# Patient Record
Sex: Male | Born: 1945 | Race: White | Hispanic: No | Marital: Married | Smoking: Never smoker
Health system: Southern US, Community
[De-identification: ages and names within clinical notes are randomized; demographics above are authoritative.]

## PROBLEM LIST (undated history)

## (undated) DIAGNOSIS — E785 Hyperlipidemia, unspecified: Secondary | ICD-10-CM

## (undated) DIAGNOSIS — G43909 Migraine, unspecified, not intractable, without status migrainosus: Secondary | ICD-10-CM

## (undated) DIAGNOSIS — I639 Cerebral infarction, unspecified: Secondary | ICD-10-CM

## (undated) DIAGNOSIS — I10 Essential (primary) hypertension: Secondary | ICD-10-CM

## (undated) DIAGNOSIS — I6529 Occlusion and stenosis of unspecified carotid artery: Secondary | ICD-10-CM

## (undated) HISTORY — PX: BACK SURGERY: SHX140

## (undated) HISTORY — DX: Occlusion and stenosis of unspecified carotid artery: I65.29

## (undated) HISTORY — PX: TESTICLE REMOVAL: SHX68

## (undated) HISTORY — DX: Essential (primary) hypertension: I10

## (undated) HISTORY — DX: Hyperlipidemia, unspecified: E78.5

## (undated) HISTORY — PX: PATELLA FRACTURE SURGERY: SHX735

## (undated) HISTORY — PX: EYE SURGERY: SHX253

## (undated) HISTORY — DX: Migraine, unspecified, not intractable, without status migrainosus: G43.909

## (undated) HISTORY — PX: LUMBAR DISC SURGERY: SHX700

---

## 2013-10-07 ENCOUNTER — Encounter: Payer: Self-pay | Admitting: Podiatry

## 2013-10-07 ENCOUNTER — Ambulatory Visit (INDEPENDENT_AMBULATORY_CARE_PROVIDER_SITE_OTHER): Payer: Medicare Other | Admitting: Podiatry

## 2013-10-07 VITALS — BP 123/65 | HR 65 | Resp 16 | Ht 69.0 in | Wt 175.0 lb

## 2013-10-07 DIAGNOSIS — L600 Ingrowing nail: Secondary | ICD-10-CM

## 2013-10-07 MED ORDER — HYDROCODONE-ACETAMINOPHEN 10-325 MG PO TABS: 1.0000 | ORAL_TABLET | Freq: Three times a day (TID) | ORAL | Status: DC | PRN

## 2013-10-07 NOTE — Progress Notes (Signed)
  Subjective:    Patient ID: Arthur Guzman, male    DOB: Jun 08, 1946, 67 y.o.   MRN: [id number] "I have a sore left big toe.  It's the toenail, I can hardly wear a shoe." Toe Pain  The incident occurred more than 1 week ago. There was no injury mechanism. The pain is present in the left toes. The quality of the pain is described as aching. The pain is at a severity of 8/10. The pain is moderate. The pain has been worsening since onset. He reports no foreign bodies present. The symptoms are aggravated by palpation. The treatment provided no relief.      Review of Systems  Constitutional: Negative.   HENT: Negative.   Eyes: Negative.   Respiratory: Negative.   Cardiovascular: Negative.   Gastrointestinal: Negative.   Endocrine: Negative.   Genitourinary: Negative.   Musculoskeletal: Positive for neck stiffness.  Skin: Positive for rash.  Allergic/Immunologic: Negative.   Neurological: Negative.   Hematological: Negative.   Psychiatric/Behavioral: Negative.        Objective:   Physical Exam  A 67 year old white male presents today complaining of painful left hallux toenail. He appears orientated x3.  Vascular: The DP is are two over four bilaterally. PTs are 4 over 4 bilaterally. Capillary fill is immediate bilaterally.  Dermatological: The left hallux is incurvated dystrophic with texture and color changes and palpable tenderness.  Musculoskeletal: Some restriction in the first metatarsal phalangeal joint noted bilaterally.  Neurological: Sensation intact bilaterally       Assessment & Plan:  Assessment: Painful ingrowing toenail left hallux.  Plan: Offered patient alternatives in treatment including nonsurgical and surgical. I recommended total removal of the left hallux toenail for permanent correction. He verbally consents to the procedure. The left hallux was then blocked with 3 cc of 50-50 mixture of 2% plain Xylocaine and 0.5% plain Marcaine. The hallux is  prepped with Betadine and exsanguinated. The left hallux nail is excised and a phenol matricectomy performed. An antibiotic dressing was applied. The tourniquet was released and spontaneous Capillary filling time was noted in the left hallux. Postoperative instructions provided. Hydrocodone 10 mg /325 was prescribed. Reappoint at patient's request.  Richard C.Leeanne Deed, DPM

## 2013-10-07 NOTE — Patient Instructions (Signed)

## 2014-09-22 ENCOUNTER — Ambulatory Visit (INDEPENDENT_AMBULATORY_CARE_PROVIDER_SITE_OTHER): Payer: Medicare Other | Admitting: Podiatry

## 2014-09-22 ENCOUNTER — Encounter: Payer: Self-pay | Admitting: Podiatry

## 2014-09-22 VITALS — BP 160/86 | HR 58 | Resp 17

## 2014-09-22 DIAGNOSIS — L600 Ingrowing nail: Secondary | ICD-10-CM

## 2014-09-22 MED ORDER — HYDROCODONE-ACETAMINOPHEN 5-325 MG PO TABS: 1.0000 | ORAL_TABLET | ORAL | Status: DC | PRN

## 2014-09-22 MED ORDER — HYDROCODONE-ACETAMINOPHEN 10-325 MG PO TABS: 1.0000 | ORAL_TABLET | Freq: Three times a day (TID) | ORAL | Status: DC | PRN

## 2014-09-22 NOTE — Progress Notes (Signed)
   Subjective:    Patient ID: Arthur Guzman, male    DOB: 29-Jul-1946, 68 y.o.   MRN: [id number]  HPI  Pt presents with left 2nd toenail pain. Worsens when wearing shoes that rub against it. Ongoing problem for oximetry 12 months. He has difficulty trimming the toenail and is requesting removal of the second left toenail the   Review of Systems  All other systems reviewed and are negative.      Objective:   Physical Exam  Orientated x3  Vascular: DP pulses 2/4 bilaterally PT pulses 2/4 bilaterally  Neurological: Ankle reflex equal and reactive bilaterally  Dermatological: The second left toenail is hypertrophic, incurvated and tender to palpation     Assessment & Plan:   Assessment: Deformed ingrowing second toenail left foot  Plan: Discuss treatment options including repetitive debridement, versus permanent toenail removal. Patient opts for permanent toenail removal, and verbally consents to procedure  The second left toe was blocked with 2 cc of 50-50 mixture of 2% plain Xylocaine and 0.5% plain Marcaine. The toe is prepped with Betadine and exsanguinated. The second left toenail was avulsed and a phenol matricectomy performed. An antibiotic dressing was applied and the tourniquet was released demonstrating immediate capillary fill. Patient tolerated procedure without a difficulty.  Postoperative oral reconstruction provided Rx hydrocodone 5/325 #12 take one every 4 hours as needed for pain control  I incorrectly initially ordered hydrocodone 10/325 and did not realize until I printed out a prescription that to prescriptions for hydrocodone were prescribed. I crossed out the hydrocodone 10 on the prescription and will only use the hydrocodone 5/325.  Reappoint at patient's request

## 2014-09-22 NOTE — Patient Instructions (Signed)

## 2015-12-18 HISTORY — PX: CATARACT EXTRACTION: SUR2

## 2017-11-28 ENCOUNTER — Encounter: Payer: Self-pay | Admitting: Surgery

## 2019-01-30 ENCOUNTER — Encounter: Payer: Self-pay | Admitting: Gastroenterology

## 2019-02-24 ENCOUNTER — Ambulatory Visit: Payer: Self-pay | Admitting: Gastroenterology

## 2019-07-05 ENCOUNTER — Emergency Department (HOSPITAL_COMMUNITY)
Admission: EM | Admit: 2019-07-05 | Discharge: 2019-07-05 | Disposition: A | Discharge status: Home / Self Care | Payer: Medicare Other | Attending: Emergency Medicine | Admitting: Emergency Medicine

## 2019-07-05 ENCOUNTER — Other Ambulatory Visit: Payer: Self-pay

## 2019-07-05 ENCOUNTER — Encounter (HOSPITAL_COMMUNITY): Payer: Self-pay | Admitting: Radiology

## 2019-07-05 ENCOUNTER — Emergency Department (HOSPITAL_COMMUNITY): Payer: Medicare Other

## 2019-07-05 DIAGNOSIS — R51 Headache: Secondary | ICD-10-CM | POA: Diagnosis present

## 2019-07-05 DIAGNOSIS — R519 Headache, unspecified: Secondary | ICD-10-CM

## 2019-07-05 DIAGNOSIS — R112 Nausea with vomiting, unspecified: Secondary | ICD-10-CM

## 2019-07-05 LAB — URINALYSIS, ROUTINE W REFLEX MICROSCOPIC
Bilirubin Urine: NEGATIVE
Glucose, UA: NEGATIVE mg/dL
Hgb urine dipstick: NEGATIVE
Ketones, ur: NEGATIVE mg/dL
Leukocytes,Ua: NEGATIVE
Nitrite: NEGATIVE
Protein, ur: NEGATIVE mg/dL
Specific Gravity, Urine: 1.013 (ref 1.005–1.030)
pH: 8 (ref 5.0–8.0)

## 2019-07-05 LAB — COMPREHENSIVE METABOLIC PANEL
ALT: 23 U/L (ref 0–44)
AST: 26 U/L (ref 15–41)
Albumin: 3.9 g/dL (ref 3.5–5.0)
Alkaline Phosphatase: 79 U/L (ref 38–126)
Anion gap: 9 (ref 5–15)
BUN: 15 mg/dL (ref 8–23)
CO2: 26 mmol/L (ref 22–32)
Calcium: 9.3 mg/dL (ref 8.9–10.3)
Chloride: 102 mmol/L (ref 98–111)
Creatinine, Ser: 0.98 mg/dL (ref 0.61–1.24)
GFR calc Af Amer: >60 mL/min (ref >60)
GFR calc non Af Amer: >60 mL/min (ref >60)
Glucose, Bld: 135 mg/dL — ABNORMAL HIGH (ref 70–99)
Potassium: 3.3 mmol/L — ABNORMAL LOW (ref 3.5–5.1)
Sodium: 137 mmol/L (ref 135–145)
Total Bilirubin: 0.7 mg/dL (ref 0.3–1.2)
Total Protein: 7.5 g/dL (ref 6.5–8.1)

## 2019-07-05 LAB — CBC
HCT: 41 % (ref 39.0–52.0)
Hemoglobin: 14.1 g/dL (ref 13.0–17.0)
MCH: 31.3 pg (ref 26.0–34.0)
MCHC: 34.4 g/dL (ref 30.0–36.0)
MCV: 90.9 fL (ref 80.0–100.0)
Platelets: 212 10*3/uL (ref 150–400)
RBC: 4.51 MIL/uL (ref 4.22–5.81)
RDW: 12.9 % (ref 11.5–15.5)
WBC: 12.3 10*3/uL — ABNORMAL HIGH (ref 4.0–10.5)
nRBC: 0 % (ref 0.0–0.2)

## 2019-07-05 LAB — TROPONIN I (HIGH SENSITIVITY)
Troponin I (High Sensitivity): 5 ng/L (ref <18)
Troponin I (High Sensitivity): 5 ng/L (ref <18)

## 2019-07-05 LAB — LIPASE, BLOOD: Lipase: 30 U/L (ref 11–51)

## 2019-07-05 IMAGING — CT CT ANGIOGRAPHY HEAD
1 of 10 series · 6 of 47 positions shown · IV contrast (omnipaque)
Comparison: None.

CLINICAL DATA: Awoke with headache, nausea and vomiting.

EXAM:
CT ANGIOGRAPHY HEAD
TECHNIQUE: Multidetector CT imaging of the head was performed using the
standard protocol during bolus administration of intravenous
contrast. Multiplanar CT image reconstructions and MIPs were
obtained to evaluate the vascular anatomy.
CONTRAST:  50mL OMNIPAQUE IOHEXOL 350 MG/ML SOLN

[Series 9: cow 2.0 · axial · 0.41mm/px · z∈[+1284,+1394]mm · 6 of 79 slices shown]
[im 12/79  brain]
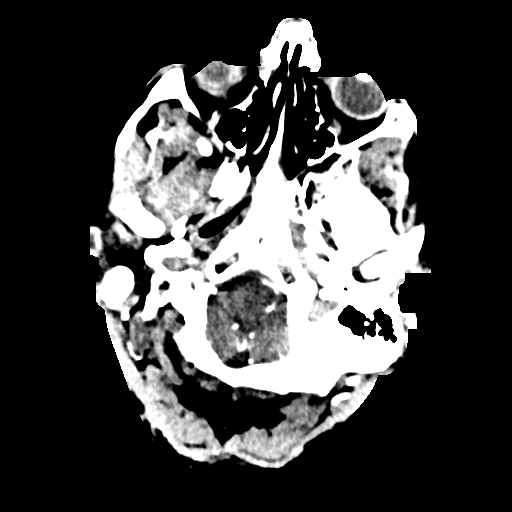
[im 23/79  bone]
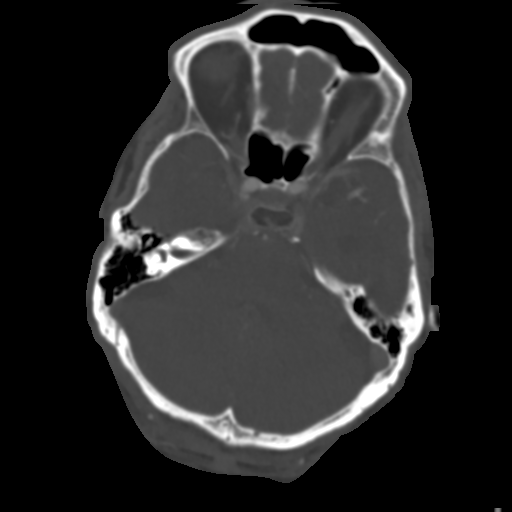
[im 34/79  brain]
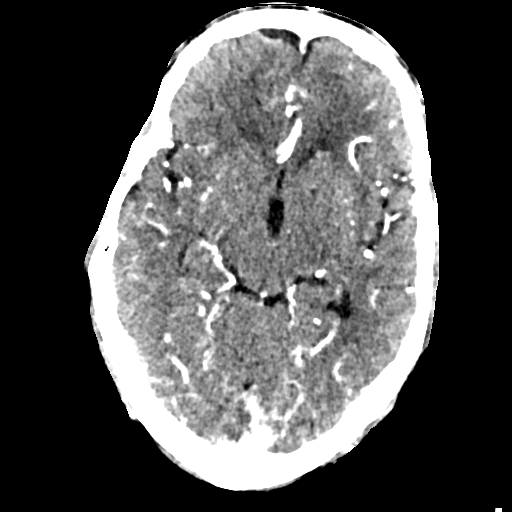
[im 45/79  bone]
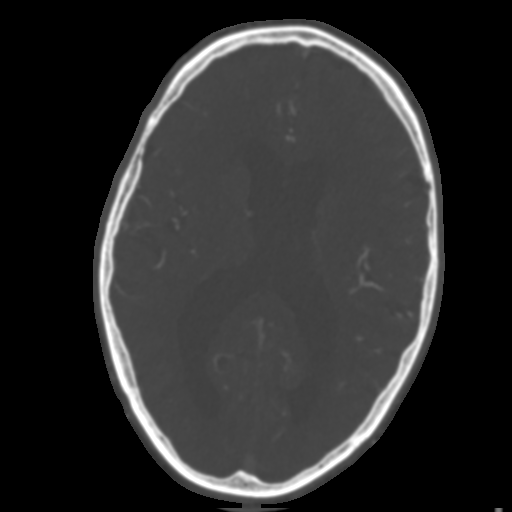
[im 56/79  brain]
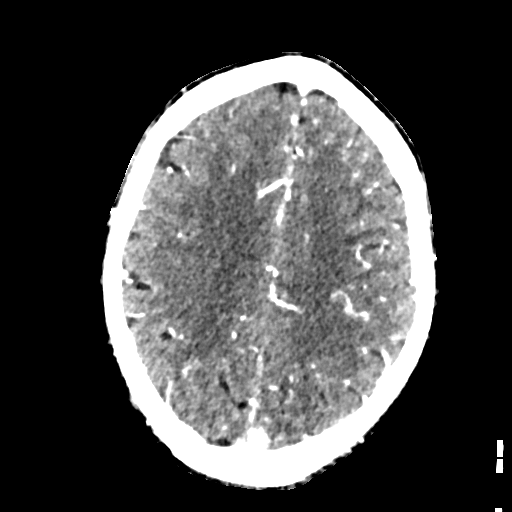
[im 67/79  bone]
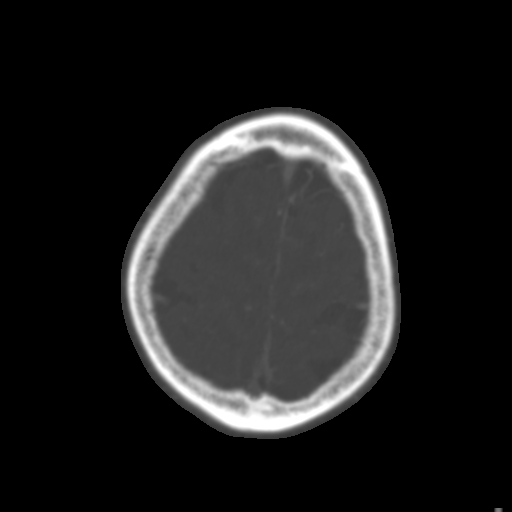

[6 of 47 positions shown; findings below may reference images not displayed]

FINDINGS: CT HEAD

Brain: No sign of acute infarction, mass lesion, hemorrhage,
hydrocephalus or extra-axial collection. Chronic small-vessel
ischemic changes of the hemispheric white matter. Old lacunar
infarctions in both caudate spur. No large vessel territory stroke.

Vascular: There is atherosclerotic calcification of the major
vessels at the base of the brain.

Skull: Negative

Sinuses: Clear/normal

Orbits: Normal

CTA HEAD

Anterior circulation: Both internal carotid arteries are patent
through the skull base and siphon regions. No siphon stenosis. The
anterior and middle cerebral vessels are patent without proximal
stenosis, aneurysm or vascular malformation. No large or medium
vessel occlusion identified.

Posterior circulation: Both vertebral arteries are patent at the
foramen magnum with the right being dominant. Both vertebral artery
supplied PICA then join the basilar. The basilar is a small vessel
due to primary fetal origin of both posterior cerebral arteries.
There is mild ectasia the P1 segment of the right PCA but I would
not categorize this as an aneurysm.

Venous sinuses: Patent and normal.

Anatomic variants: None significant.

Delayed phase: No abnormal enhancement.
IMPRESSION: Head CT: No acute finding. Chronic small-vessel ischemic changes of
the white matter and caudate nuclei.

CT angiography: No stenosis or occlusion. No aneurysm or vascular
malformation. Mild ectasia of the right P1 segment of the PCA, not
felt to be significant.

## 2019-07-05 MED ORDER — ONDANSETRON 4 MG PO TBDP: 4.0000 mg | ORAL_TABLET | Freq: Four times a day (QID) | ORAL | 0 refills | Status: DC | PRN

## 2019-07-05 MED ORDER — ONDANSETRON HCL 4 MG/2ML IJ SOLN
4.0000 mg | Freq: Once | INTRAMUSCULAR | Status: AC
  Administered 2019-07-05: 4 mg via INTRAVENOUS
  Filled 2019-07-05: qty 2

## 2019-07-05 MED ORDER — KETOROLAC TROMETHAMINE 30 MG/ML IJ SOLN
15.0000 mg | Freq: Once | INTRAMUSCULAR | Status: AC
  Administered 2019-07-05: 08:00:00 15 mg via INTRAVENOUS
  Filled 2019-07-05: qty 1

## 2019-07-05 MED ORDER — METOCLOPRAMIDE HCL 5 MG/ML IJ SOLN
10.0000 mg | Freq: Once | INTRAMUSCULAR | Status: AC
  Administered 2019-07-05: 07:00:00 10 mg via INTRAVENOUS
  Filled 2019-07-05: qty 2

## 2019-07-05 MED ORDER — IOHEXOL 350 MG/ML SOLN
50.0000 mL | Freq: Once | INTRAVENOUS | Status: AC | PRN
  Administered 2019-07-05: 06:00:00 50 mL via INTRAVENOUS

## 2019-07-05 MED ORDER — SODIUM CHLORIDE 0.9 % IV BOLUS (SEPSIS)
1000.0000 mL | Freq: Once | INTRAVENOUS | Status: AC
  Administered 2019-07-05: 07:00:00 1000 mL via INTRAVENOUS

## 2019-07-05 MED ORDER — DIPHENHYDRAMINE HCL 50 MG/ML IJ SOLN
25.0000 mg | Freq: Once | INTRAMUSCULAR | Status: AC
  Administered 2019-07-05: 07:00:00 25 mg via INTRAVENOUS
  Filled 2019-07-05: qty 1

## 2019-07-05 NOTE — Discharge Instructions (Addendum)
You may take Tylenol 1000 mg every 6 hours as needed for pain.   Steps to find a Primary Care Provider (PCP):  Call [phone] or [phone] to access "[facility]."  2.  You may also go on the [facility] website at [email or web address]  3.  [facility] also frequently accepts new patients.  [place] [phone]  4.  There are also multiple [facility], [facility] and [facility] practices throughout the [place] that are frequently accepting new patients. You may find a clinic that is close to your home and contact them.  [email or web address] [phone]   [facility] [email or web address]  [email or web address] [place] [email or web address] [phone]  5.  Local Health Departments also can provide primary care services.  [facility]  [place] [address] [phone]  [facility] [place] [address] [address] [place] [phone]

## 2019-07-05 NOTE — ED Triage Notes (Signed)
Pt c/o waking up with a HA and abdominal pain. Feels nauseas and vomited x1

## 2019-07-05 NOTE — ED Provider Notes (Signed)
TIME SEEN: 5:58 AM  CHIEF COMPLAINT: headache  HPI: Patient is a 73 year old male with no significant past medical history presents to the emergency department with sudden onset frontal throbbing headache that woke him from sleep at midnight.  He denies ever having a similar headache or history of chronic headaches.  States he had lightheadedness and nausea with one episode of vomiting.  Headache improved slightly after vomiting.  Denies numbness, tingling or focal weakness.  No head injury.  Not on blood thinners.  He denies abdominal pain despite nursing notes.  States it was just a "nausea".  No diarrhea.  No fever.  No chest pain or shortness of breath.  ROS: See HPI Constitutional: no fever  Eyes: no drainage  ENT: no runny nose   Cardiovascular:  no chest pain  Resp: no SOB  GI: no vomiting GU: no dysuria Integumentary: no rash  Allergy: no hives  Musculoskeletal: no leg swelling  Neurological: no slurred speech ROS otherwise negative  PAST MEDICAL HISTORY/PAST SURGICAL HISTORY:  No past medical history on file.  MEDICATIONS:  Prior to Admission medications   Medication Sig Start Date End Date Taking? Authorizing Provider  HYDROcodone-acetaminophen (NORCO) 10-325 MG per tablet Take 1 tablet by mouth every 8 (eight) hours as needed. 09/22/14   Tuchman, Leslye Peer, DPM  HYDROcodone-acetaminophen (NORCO/VICODIN) 5-325 MG per tablet Take 1 tablet by mouth every 4 (four) hours as needed. 09/22/14   Gean Birchwood, DPM    ALLERGIES:  Allergies  Allergen Reactions  . Antihistamines, Chlorpheniramine-Type     Antihistamines make me fidgety.    SOCIAL HISTORY:  Social History   Tobacco Use  . Smoking status: Never Smoker  Substance Use Topics  . Alcohol use: No    FAMILY HISTORY: Family History  Problem Relation Age of Onset  . Cancer Mother   . Heart disease Mother     EXAM: BP (!) 168/84 (BP Location: Right Arm)   Pulse 68   Temp 98.7 F (37.1 C) (Oral)    Resp 16   Ht 5\' 9"  (1.753 m)   Wt 77.6 kg   SpO2 99%   BMI 25.25 kg/m  CONSTITUTIONAL: Alert and oriented and responds appropriately to questions. Well-appearing; well-nourished, elderly, in no apparent distress HEAD: Normocephalic EYES: Conjunctivae clear, pupils appear equal, EOMI ENT: normal nose; moist mucous membranes NECK: Supple, no meningismus, no nuchal rigidity, no LAD  CARD: RRR; S1 and S2 appreciated; no murmurs, no clicks, no rubs, no gallops RESP: Normal chest excursion without splinting or tachypnea; breath sounds clear and equal bilaterally; no wheezes, no rhonchi, no rales, no hypoxia or respiratory distress, speaking full sentences ABD/GI: Normal bowel sounds; non-distended; soft, non-tender, no rebound, no guarding, no peritoneal signs, no hepatosplenomegaly BACK:  The back appears normal and is non-tender to palpation, there is no CVA tenderness EXT: Normal ROM in all joints; non-tender to palpation; no edema; normal capillary refill; no cyanosis, no calf tenderness or swelling    SKIN: Normal color for age and race; warm; no rash NEURO: Moves all extremities equally, strength 5/5 in all 4 extremities, cranial nerves II through XII intact, normal speech, sensation to light touch intact diffusely PSYCH: The patient's mood and manner are appropriate. Grooming and personal hygiene are appropriate.  MEDICAL DECISION MAKING: Patient here with what he describes a sudden onset headache that woke him from sleep.  States he reports the pain was severe but improved after vomiting.  We are still within the window to rule  out subarachnoid hemorrhage with CT scan.  Discussed with CT technician and nurse to place IV emergently and take patient to CT imaging immediately.  Labs performed in triage show mild leukocytosis but otherwise unremarkable.  Troponin negative.  EKG normal.  He has no chest pain.  No neurologic deficits.  Will treat symptoms with Reglan, Benadryl, IV fluids.  ED  PROGRESS: Patient's head CT shows no acute abnormality specifically no bleed or aneurysm.  Reports some improvement in headache with Reglan, Benadryl and IV fluids.  States his headache is a 4/10 but is still feeling nauseated.  Requesting something else for pain and nausea.  Will give Toradol, Zofran and reassess.  Signed out to Dr. Sedonia Small but anticipate discharge home once symptoms have improved.  Patient also comfortable with this plan.   I reviewed all nursing notes, vitals, pertinent previous records, EKGs, lab and urine results, imaging (as available).    EKG Interpretation  Date/Time:  Sunday July 05 2019 05:44:43 EDT Ventricular Rate:  66 PR Interval:    QRS Duration: 97 QT Interval:  409 QTC Calculation: 429 R Axis:   -57 Text Interpretation:  Sinus rhythm Left anterior fascicular block Abnormal R-wave progression, late transition No old tracing to compare Confirmed by Amber Williard, Cyril Mourning [phone]) on 07/05/2019 5:54:57 AM         Beverely Suen, Delice Bison, DO 07/05/19 7373

## 2019-07-05 NOTE — ED Notes (Signed)
Pt given dc instructions pt verbalizes understanding. Pt walked out.

## 2019-07-05 NOTE — ED Provider Notes (Signed)
  Provider Note MRN:  [id number]  Arrival date & time: 07/05/19    ED Course and Medical Decision Making  Assumed care from Dr. Leonides Schanz at shift change.  Patient presented with acute onset headache with nausea waking him from sleep.  CTA imaging reveals no abnormalities.  Patient's abdomen is soft and nontender, he has a normal neurological exam, no meningismus, no visual complaints.  He is feeling much better after Toradol and Zofran.  He explains that he used to have frequent headaches at a younger age.  Favoring migraine, appropriate for discharge.  After the discussed management above, the patient was determined to be safe for discharge.  The patient was in agreement with this plan and all questions regarding their care were answered.  ED return precautions were discussed and the patient will return to the ED with any significant worsening of condition.  Final Clinical Impressions(s) / ED Diagnoses     ICD-10-CM   1. Generalized headache  R51   2. Non-intractable vomiting with nausea, unspecified vomiting type  R11.2     ED Discharge Orders         Ordered    ondansetron (ZOFRAN ODT) 4 MG disintegrating tablet  Every 6 hours PRN     07/05/19 0725           Barth Kirks. Sedonia Small, [place] [email or web address]    Maudie Flakes, MD 07/05/19 [phone]

## 2019-07-05 NOTE — ED Notes (Signed)
Wife- Arthur Guzman, [phone] (cell), [phone] (home)

## 2019-07-23 ENCOUNTER — Other Ambulatory Visit: Payer: Self-pay | Admitting: Family Medicine

## 2019-07-23 DIAGNOSIS — R1011 Right upper quadrant pain: Secondary | ICD-10-CM

## 2019-07-23 DIAGNOSIS — R1031 Right lower quadrant pain: Secondary | ICD-10-CM

## 2019-07-24 ENCOUNTER — Ambulatory Visit
Admission: RE | Admit: 2019-07-24 | Discharge: 2019-07-24 | Disposition: A | Discharge status: Home / Self Care | Payer: Medicare Other | Source: Ambulatory Visit | Attending: Family Medicine | Admitting: Family Medicine

## 2019-07-24 DIAGNOSIS — R1031 Right lower quadrant pain: Secondary | ICD-10-CM

## 2019-07-24 DIAGNOSIS — R1011 Right upper quadrant pain: Secondary | ICD-10-CM

## 2019-07-24 IMAGING — US ULTRASOUND ABDOMEN COMPLETE
1 series · 14 of 25 positions shown · non-contrast
Comparison: None.

CLINICAL DATA: Right upper quadrant pain

EXAM:
ABDOMEN ULTRASOUND COMPLETE

[Series 1: ultrasound abdomen complete · 0.23mm/px · 14 of 86 slices shown]
[im 1/86]
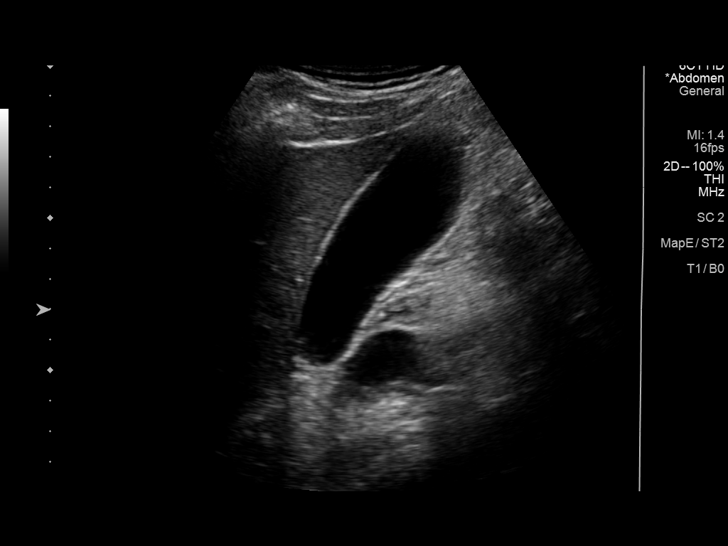
[im 8/86]
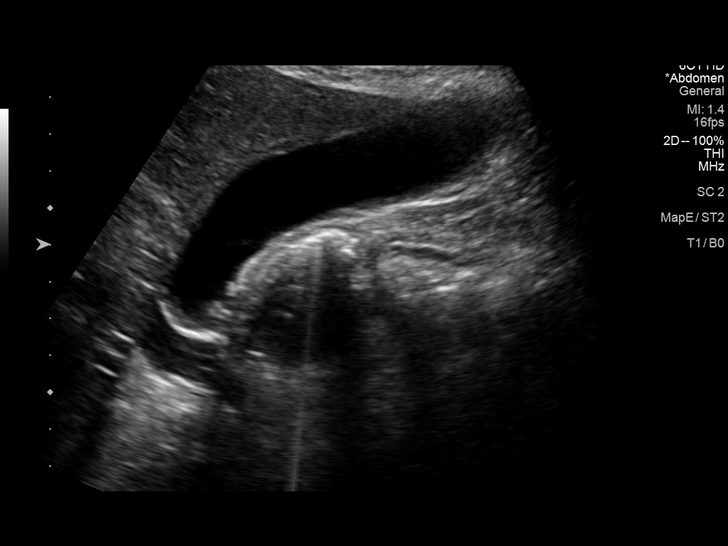
[im 15/86]
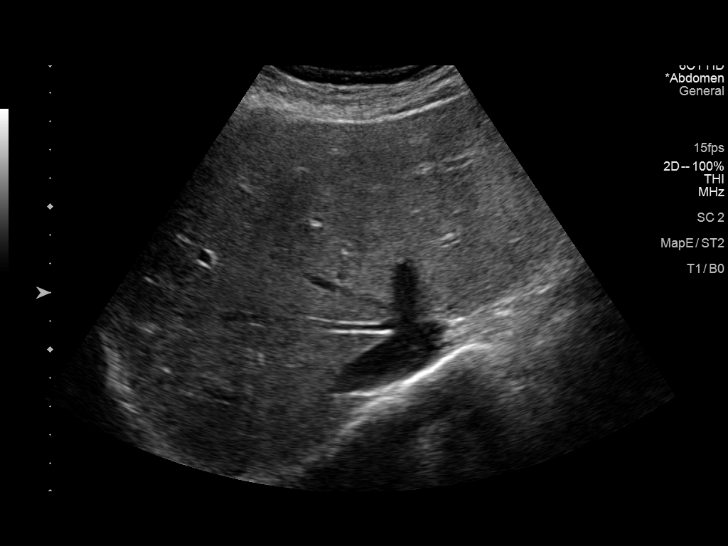
[im 22/86]
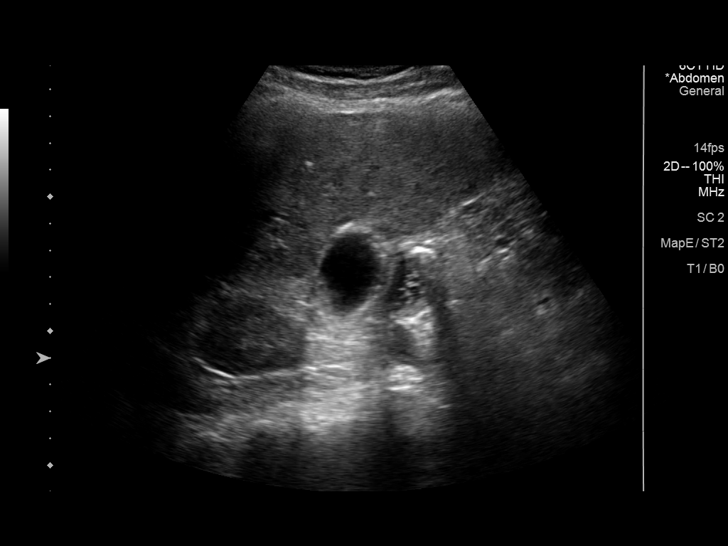
[im 29/86]
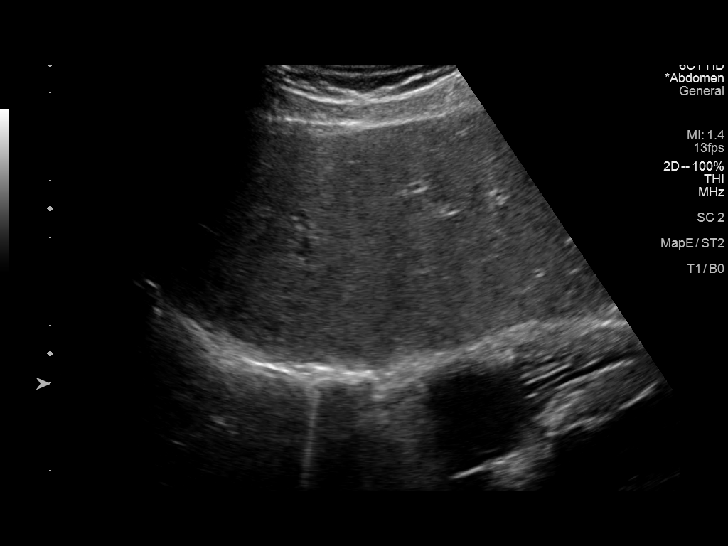
[im 32/86]
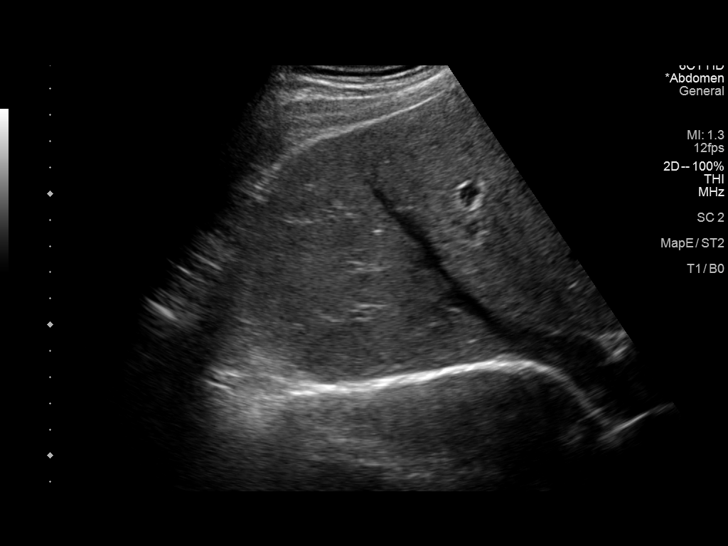
[im 39/86]
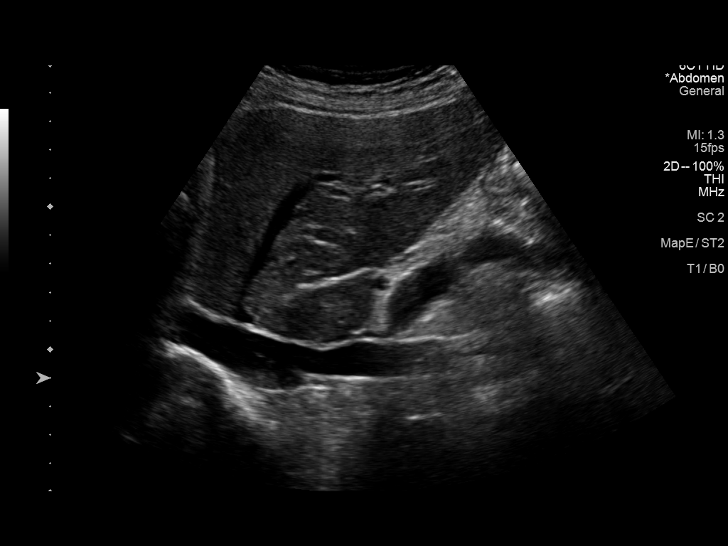
[im 47/86]
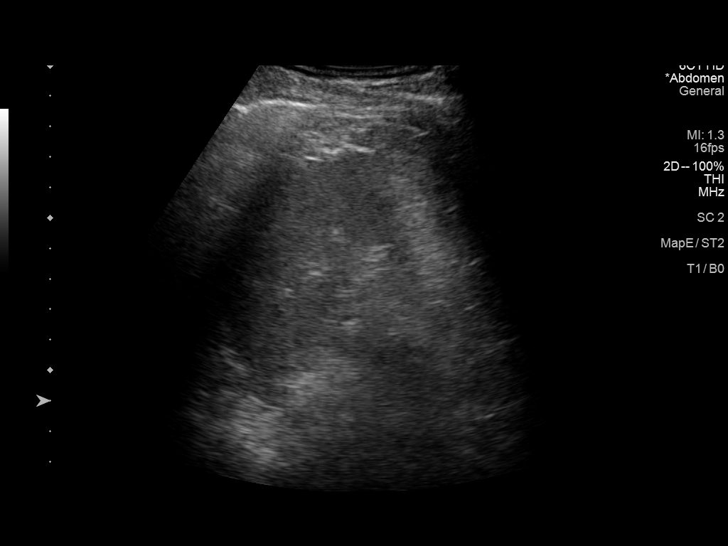
[im 54/86]
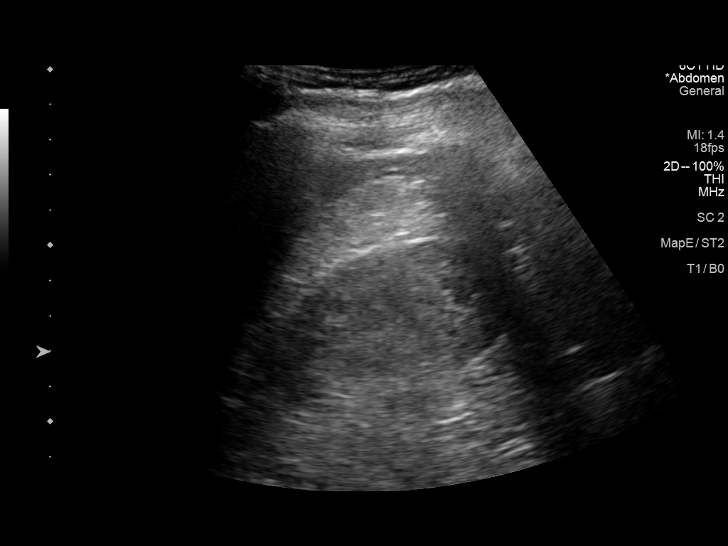
[im 57/86]
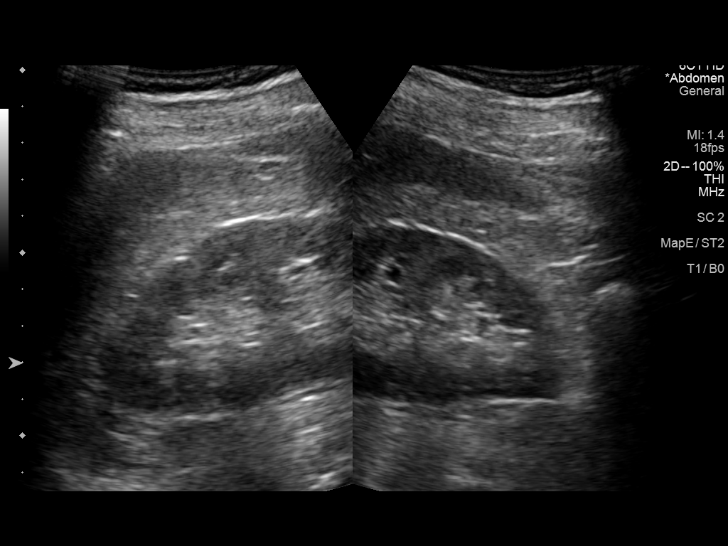
[im 64/86]
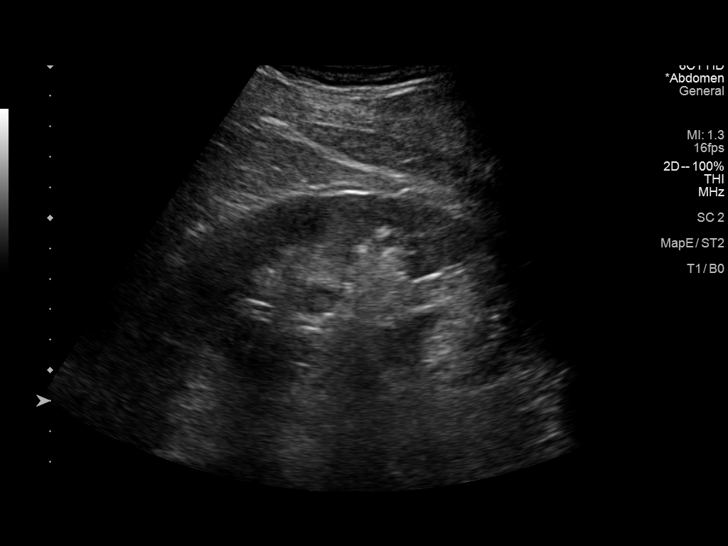
[im 71/86]
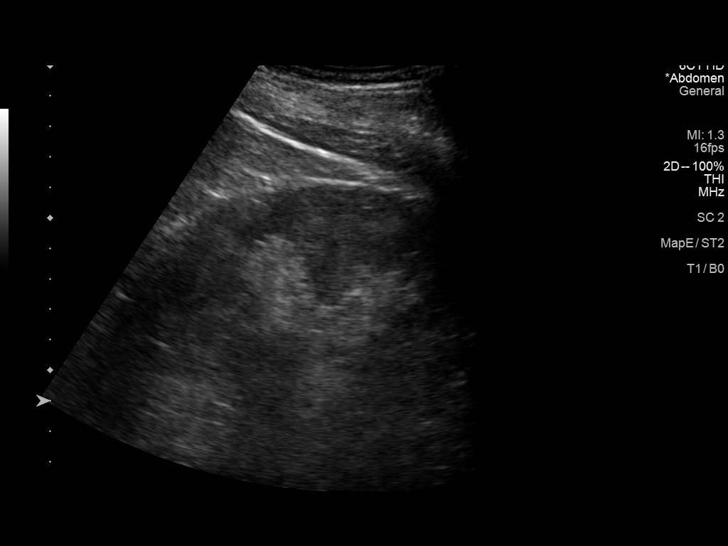
[im 78/86]
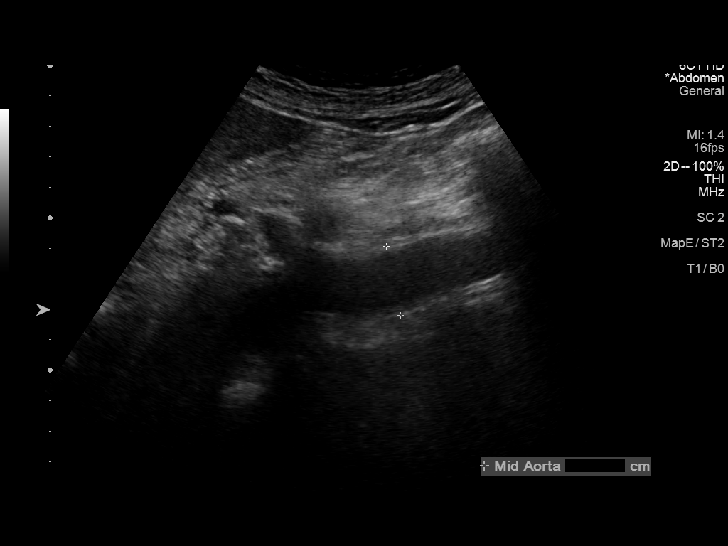
[im 86/86]
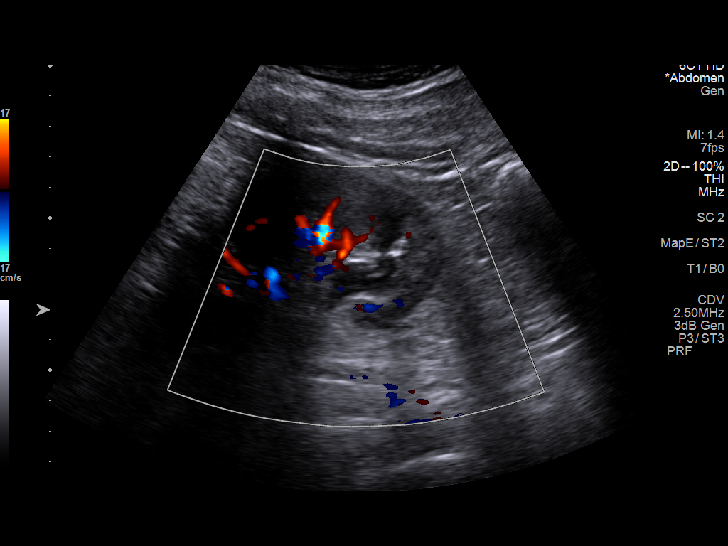

[14 of 25 positions shown; findings below may reference images not displayed]

FINDINGS: Gallbladder: No gallstones or wall thickening visualized. No
sonographic Murphy sign noted by sonographer.

Common bile duct: Diameter: 6 mm

Liver: No focal lesion identified. Within normal limits in
parenchymal echogenicity. Portal vein is patent on color Doppler
imaging with normal direction of blood flow towards the liver.

IVC: No abnormality visualized.

Pancreas: Visualized portion unremarkable.

Spleen: Size and appearance within normal limits.

Right Kidney: Length: 9 cm. Echogenicity within normal limits. No
mass or hydronephrosis visualized.

Left Kidney: Length: 11 2 cm. Normal echogenicity. No mass or
hydronephrosis. Slight left kidney lower pole caliectasis,
nonspecific.

Abdominal aorta: Negative for aneurysm.  2.8 cm diameter

Other findings: No free fluid or ascites.
IMPRESSION: No acute finding by ultrasound. Negative for gallstones or biliary
dilatation.

Nonspecific minor left kidney lower pole caliectasis.

## 2019-08-07 ENCOUNTER — Ambulatory Visit: Payer: Medicare Other | Admitting: Gastroenterology

## 2019-08-25 ENCOUNTER — Encounter (HOSPITAL_COMMUNITY): Payer: Self-pay | Admitting: Emergency Medicine

## 2019-08-25 ENCOUNTER — Observation Stay (HOSPITAL_COMMUNITY)
Admission: EM | Admit: 2019-08-25 | Discharge: 2019-08-27 | Disposition: A | Discharge status: Home / Self Care | Payer: Medicare Other | Attending: Internal Medicine | Admitting: Internal Medicine

## 2019-08-25 ENCOUNTER — Emergency Department (HOSPITAL_COMMUNITY): Payer: Medicare Other

## 2019-08-25 ENCOUNTER — Other Ambulatory Visit: Payer: Self-pay

## 2019-08-25 ENCOUNTER — Observation Stay (HOSPITAL_COMMUNITY): Payer: Medicare Other

## 2019-08-25 DIAGNOSIS — R4702 Dysphasia: Secondary | ICD-10-CM | POA: Insufficient documentation

## 2019-08-25 DIAGNOSIS — D72829 Elevated white blood cell count, unspecified: Secondary | ICD-10-CM | POA: Diagnosis not present

## 2019-08-25 DIAGNOSIS — G459 Transient cerebral ischemic attack, unspecified: Secondary | ICD-10-CM | POA: Diagnosis not present

## 2019-08-25 DIAGNOSIS — Z20828 Contact with and (suspected) exposure to other viral communicable diseases: Secondary | ICD-10-CM | POA: Diagnosis not present

## 2019-08-25 DIAGNOSIS — G43909 Migraine, unspecified, not intractable, without status migrainosus: Secondary | ICD-10-CM | POA: Diagnosis present

## 2019-08-25 DIAGNOSIS — E785 Hyperlipidemia, unspecified: Secondary | ICD-10-CM | POA: Diagnosis not present

## 2019-08-25 DIAGNOSIS — R200 Anesthesia of skin: Secondary | ICD-10-CM | POA: Diagnosis not present

## 2019-08-25 DIAGNOSIS — Z8249 Family history of ischemic heart disease and other diseases of the circulatory system: Secondary | ICD-10-CM | POA: Insufficient documentation

## 2019-08-25 DIAGNOSIS — R030 Elevated blood-pressure reading, without diagnosis of hypertension: Secondary | ICD-10-CM | POA: Diagnosis present

## 2019-08-25 DIAGNOSIS — R519 Headache, unspecified: Secondary | ICD-10-CM

## 2019-08-25 DIAGNOSIS — R4701 Aphasia: Secondary | ICD-10-CM

## 2019-08-25 DIAGNOSIS — G43809 Other migraine, not intractable, without status migrainosus: Secondary | ICD-10-CM

## 2019-08-25 LAB — SARS CORONAVIRUS 2 BY RT PCR (HOSPITAL ORDER, PERFORMED IN ~~LOC~~ HOSPITAL LAB): SARS Coronavirus 2: NEGATIVE

## 2019-08-25 LAB — DIFFERENTIAL
Abs Immature Granulocytes: 0.04 10*3/uL (ref 0.00–0.07)
Basophils Absolute: 0 10*3/uL (ref 0.0–0.1)
Basophils Relative: 0 %
Eosinophils Absolute: 0 10*3/uL (ref 0.0–0.5)
Eosinophils Relative: 0 %
Immature Granulocytes: 0 %
Lymphocytes Relative: 14 %
Lymphs Abs: 1.9 10*3/uL (ref 0.7–4.0)
Monocytes Absolute: 1 10*3/uL (ref 0.1–1.0)
Monocytes Relative: 7 %
Neutro Abs: 10.8 10*3/uL — ABNORMAL HIGH (ref 1.7–7.7)
Neutrophils Relative %: 79 %

## 2019-08-25 LAB — CBC
HCT: 39.1 % (ref 39.0–52.0)
Hemoglobin: 13.4 g/dL (ref 13.0–17.0)
MCH: 31.2 pg (ref 26.0–34.0)
MCHC: 34.3 g/dL (ref 30.0–36.0)
MCV: 90.9 fL (ref 80.0–100.0)
Platelets: 202 10*3/uL (ref 150–400)
RBC: 4.3 MIL/uL (ref 4.22–5.81)
RDW: 13.2 % (ref 11.5–15.5)
WBC: 13.7 10*3/uL — ABNORMAL HIGH (ref 4.0–10.5)
nRBC: 0 % (ref 0.0–0.2)

## 2019-08-25 LAB — CBG MONITORING, ED: Glucose-Capillary: 136 mg/dL — ABNORMAL HIGH (ref 70–99)

## 2019-08-25 LAB — COMPREHENSIVE METABOLIC PANEL
ALT: 17 U/L (ref 0–44)
AST: 20 U/L (ref 15–41)
Albumin: 3.7 g/dL (ref 3.5–5.0)
Alkaline Phosphatase: 70 U/L (ref 38–126)
Anion gap: 8 (ref 5–15)
BUN: 14 mg/dL (ref 8–23)
CO2: 29 mmol/L (ref 22–32)
Calcium: 9.2 mg/dL (ref 8.9–10.3)
Chloride: 101 mmol/L (ref 98–111)
Creatinine, Ser: 0.97 mg/dL (ref 0.61–1.24)
GFR calc Af Amer: >60 mL/min (ref >60)
GFR calc non Af Amer: >60 mL/min (ref >60)
Glucose, Bld: 112 mg/dL — ABNORMAL HIGH (ref 70–99)
Potassium: 3.8 mmol/L (ref 3.5–5.1)
Sodium: 138 mmol/L (ref 135–145)
Total Bilirubin: 0.6 mg/dL (ref 0.3–1.2)
Total Protein: 7.3 g/dL (ref 6.5–8.1)

## 2019-08-25 LAB — I-STAT CHEM 8, ED
BUN: 15 mg/dL (ref 8–23)
Calcium, Ion: 1.16 mmol/L (ref 1.15–1.40)
Chloride: 100 mmol/L (ref 98–111)
Creatinine, Ser: 1 mg/dL (ref 0.61–1.24)
Glucose, Bld: 108 mg/dL — ABNORMAL HIGH (ref 70–99)
HCT: 40 % (ref 39.0–52.0)
Hemoglobin: 13.6 g/dL (ref 13.0–17.0)
Potassium: 3.7 mmol/L (ref 3.5–5.1)
Sodium: 138 mmol/L (ref 135–145)
TCO2: 26 mmol/L (ref 22–32)

## 2019-08-25 LAB — APTT: aPTT: 29 seconds (ref 24–36)

## 2019-08-25 LAB — PROTIME-INR
INR: 1 (ref 0.8–1.2)
Prothrombin Time: 13.2 seconds (ref 11.4–15.2)

## 2019-08-25 IMAGING — CT CT HEAD W/O CM
4 series · 16 of 47 positions shown, 18 images · non-contrast
Comparison: None.

CLINICAL DATA: Aphasia right wrist numbness altered mental status

EXAM:
CT HEAD WITHOUT CONTRAST
TECHNIQUE: Contiguous axial images were obtained from the base of the skull
through the vertex without intravenous contrast.

[Series 3: head without · axial · non-contrast · 0.46mm/px · z∈[-142,-22]mm · 7 of 32 slices shown, 9 images]
[im 4/32  brain]
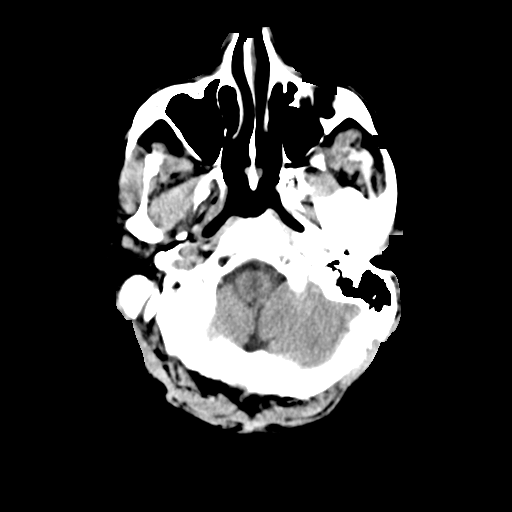
[im 4/32  bone]
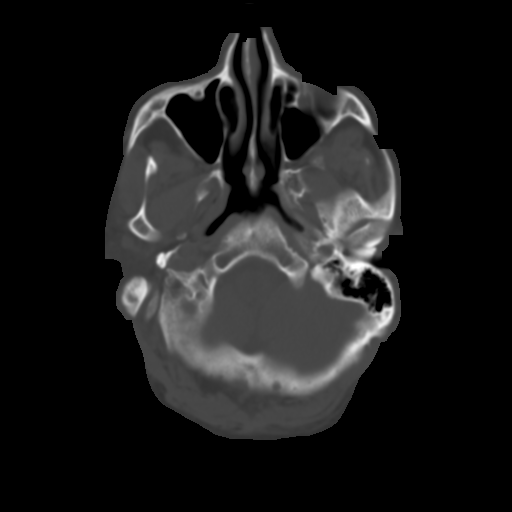
[im 8/32  brain]
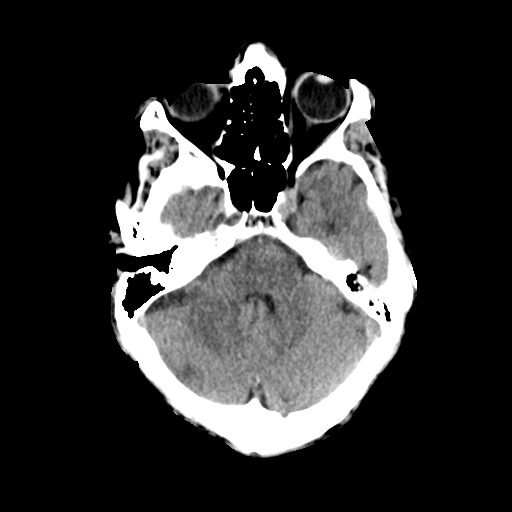
[im 12/32  brain]
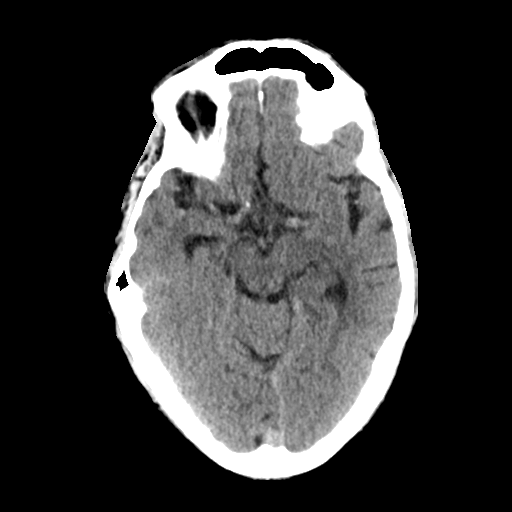
[im 16/32  brain]
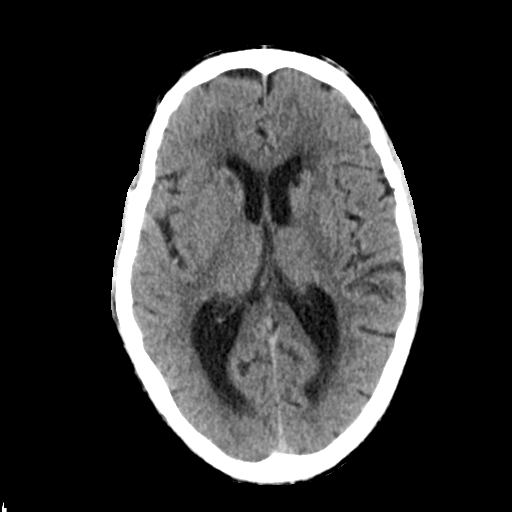
[im 20/32  brain]
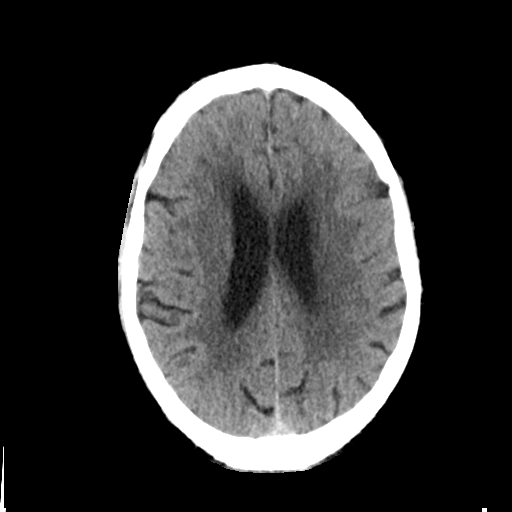
[im 20/32  bone]
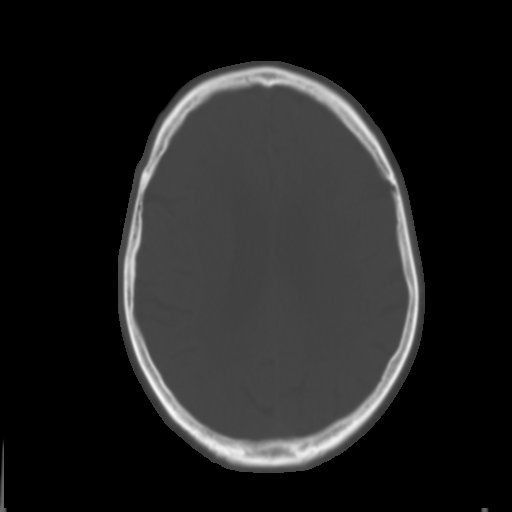
[im 24/32  brain]
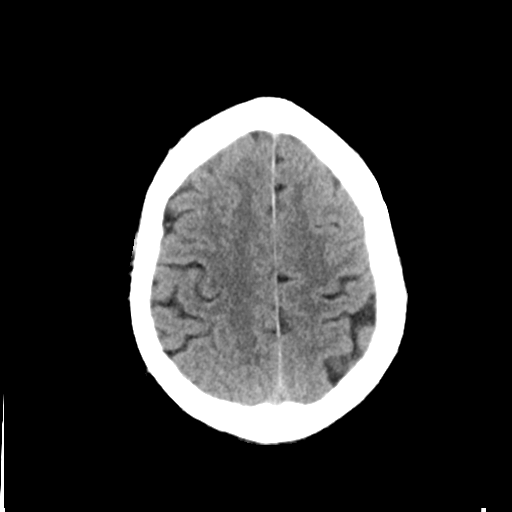
[im 28/32  brain]
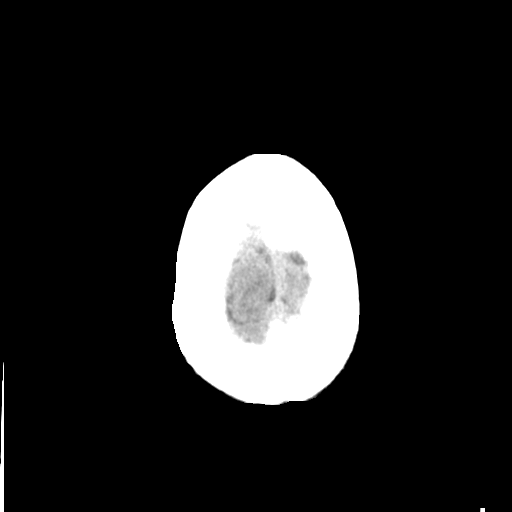

[Series 4: head bone · axial · 0.46mm/px · z∈[-143,-111]mm · 3 of 78 slices shown]
[im 8/78  bone]
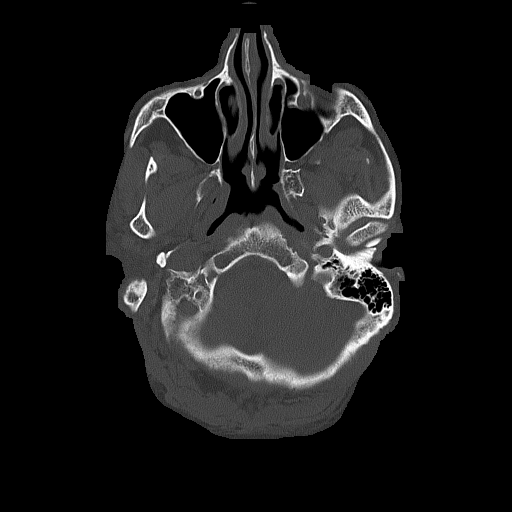
[im 16/78  bone]
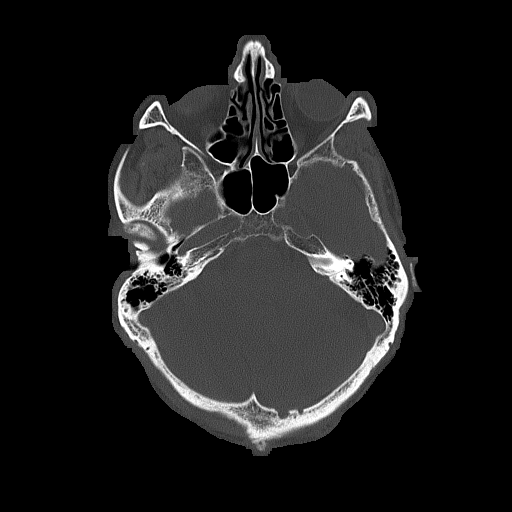
[im 24/78  bone]
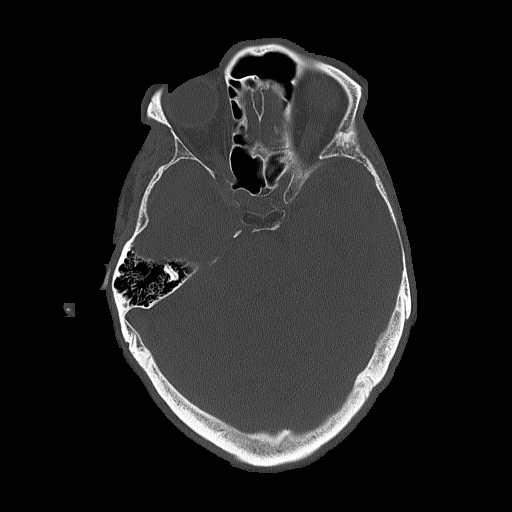

[Series 5: head without cor · coronal · non-contrast · 0.30mm/px · 3 of 73 slices shown]
[im 25/73  brain]
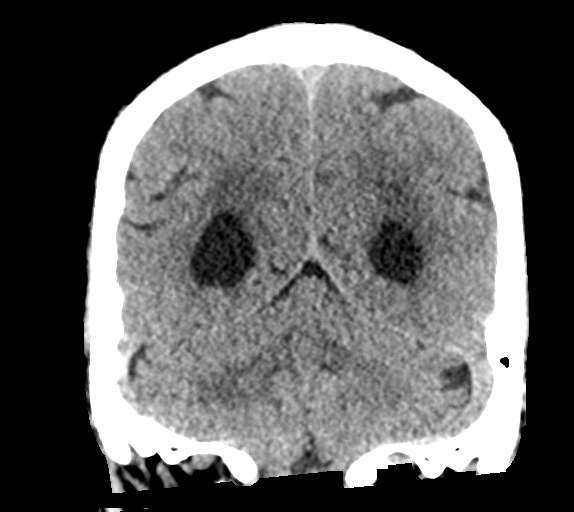
[im 33/73  brain]
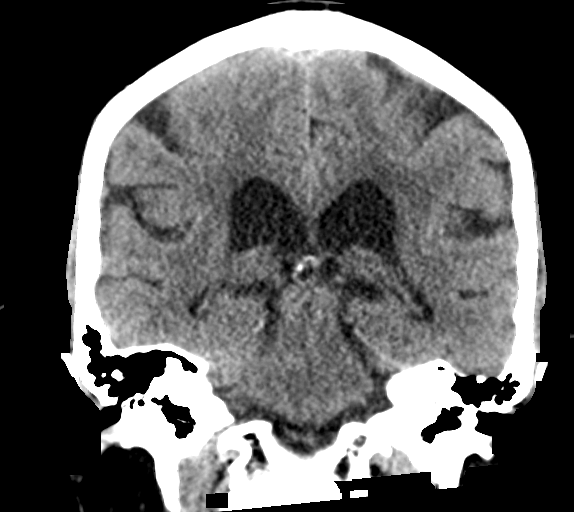
[im 41/73  brain]
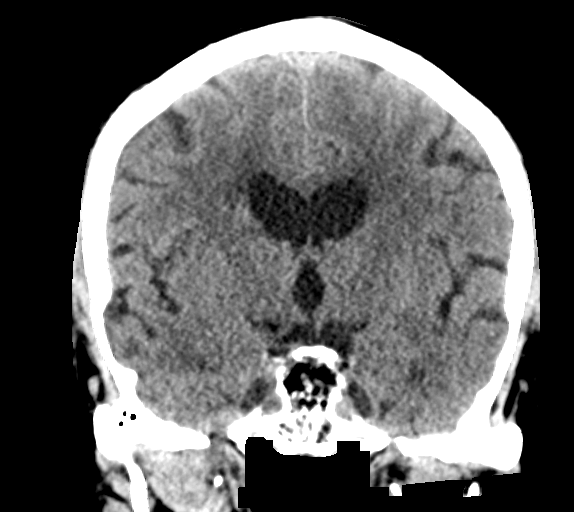

[Series 6: head without sag · sagittal · non-contrast · 0.35mm/px · 3 of 67 slices shown]
[im 23/67  brain]
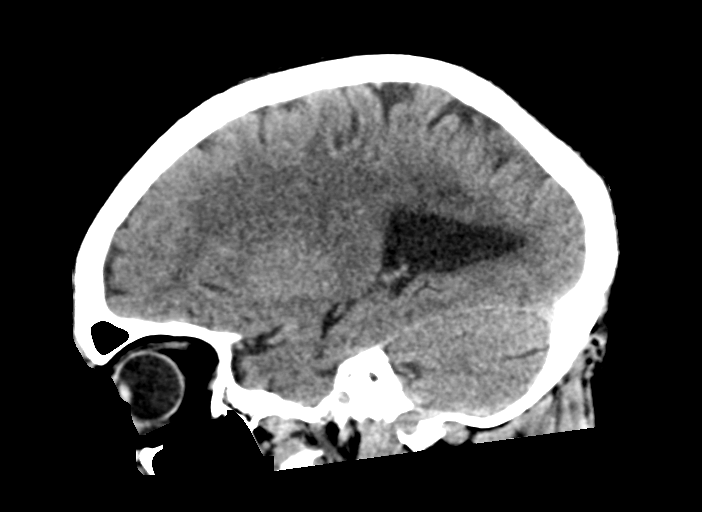
[im 34/67  brain]
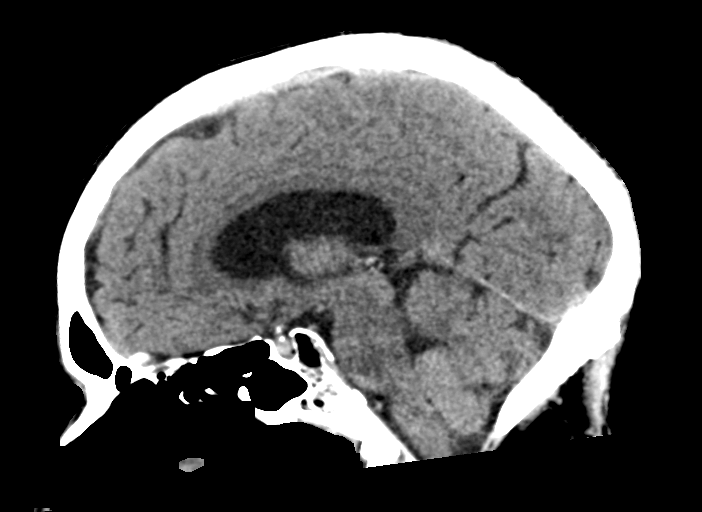
[im 45/67  brain]
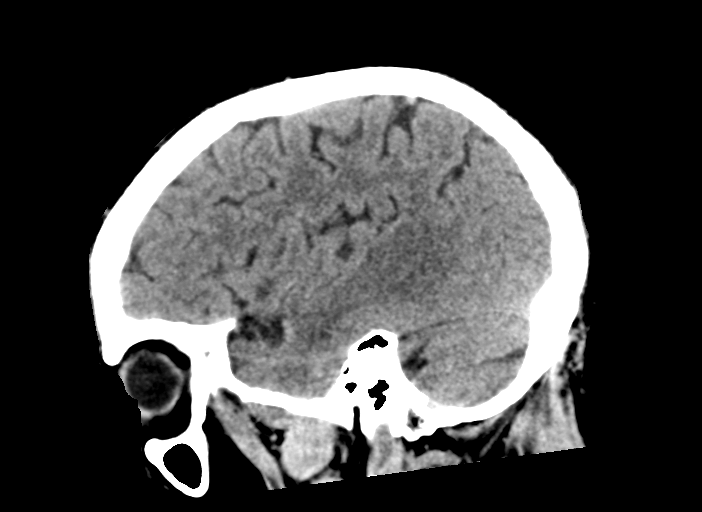

[16 of 47 positions shown; findings below may reference images not displayed]

FINDINGS: Brain: No hemorrhage or intracranial mass. Mild hypodensity in the
white matter consistent with small vessel ischemic change. Chronic
appearing lacunar infarcts within the bilateral caudate. Possible
age indeterminate lacunar infarcts in the right basal ganglia and
sub insula. Mild atrophy. Slight prominence of the ventricles likely
related to slight degree of atrophy.

Vascular: No hyperdense vessels. Scattered calcifications at the
carotid siphon

Skull: Normal. Negative for fracture or focal lesion.

Sinuses/Orbits: No acute finding.

Other: None
IMPRESSION: 1. Tiny hypodensities within the right basal ganglia and sub insula,
possible age indeterminate lacunar infarcts. Otherwise no CT
evidence for acute intracranial abnormality.
2. Chronic appearing lacunar infarcts within the caudate.
Hypodensity in the white matter consistent with small vessel
ischemic change.

## 2019-08-25 IMAGING — MR MR HEAD W/O CM
12 of 13 series · 44 of 48 positions shown · non-contrast
Comparison: Prior CT and CTA from earlier the same day.

CLINICAL DATA: Initial evaluation for focal neural deficit, stroke
suspected.

EXAM:
MRI HEAD WITHOUT CONTRAST
TECHNIQUE: Multiplanar, multiecho pulse sequences of the brain and surrounding
structures were obtained without intravenous contrast.

[Series 5: DWI · axial · 3.0mm · 0.88mm/px · z∈[+10,+147]mm · 8 of 96 slices shown (1 of 4)]
[im 1/96]
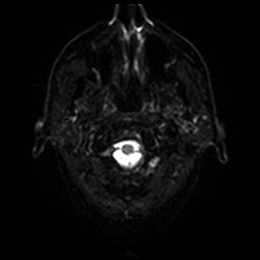
[im 14/96]
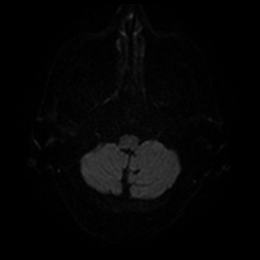
[im 28/96]
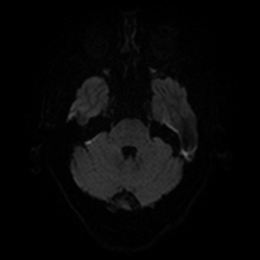
[im 41/96]
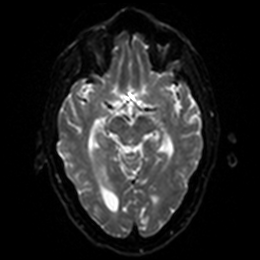
[im 55/96]
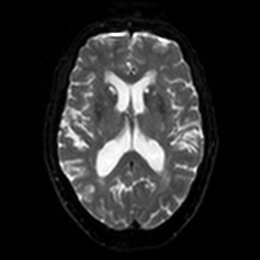
[im 68/96]
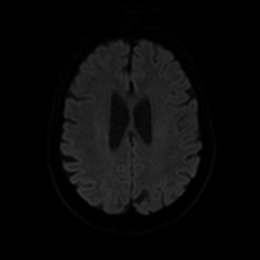
[im 82/96]
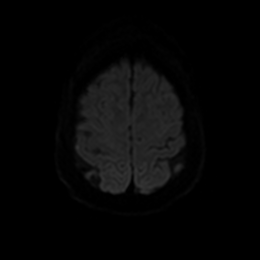
[im 96/96]
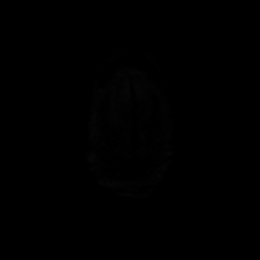

[Series 6: DWI · axial · 3.0mm · 0.88mm/px · z∈[+10,+147]mm · 4 of 48 slices shown (2 of 4)]
[im 1/48]
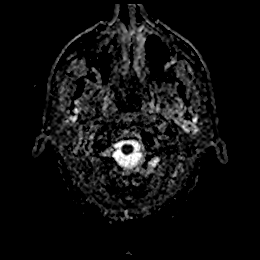
[im 16/48]
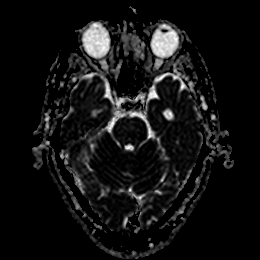
[im 32/48]
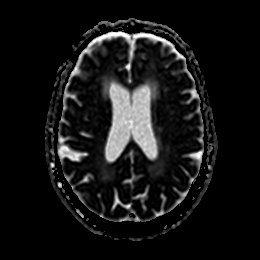
[im 48/48]
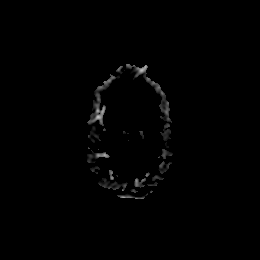

[Series 7: DWI · coronal · 4.0mm · 0.88mm/px · 5 of 74 slices shown (3 of 4)]
[im 1/74]
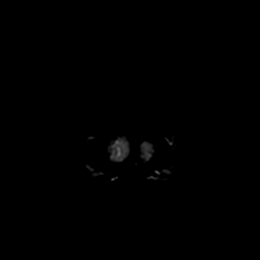
[im 19/74]
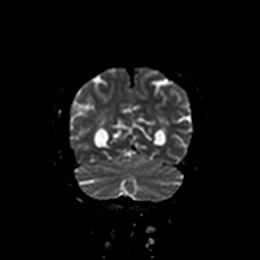
[im 37/74]
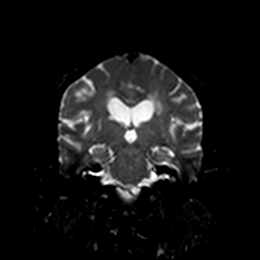
[im 55/74]
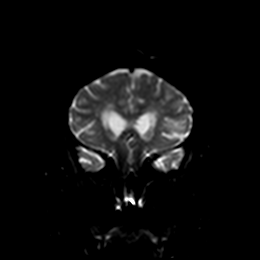
[im 74/74]
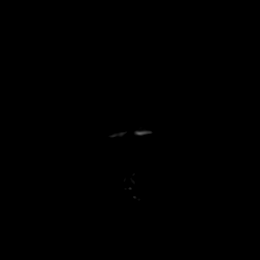

[Series 8: DWI · coronal · 4.0mm · 0.88mm/px · 3 of 37 slices shown (4 of 4)]
[im 1/37]
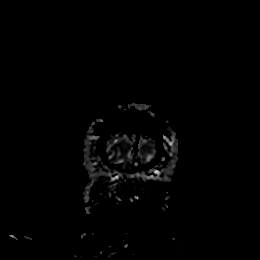
[im 19/37]
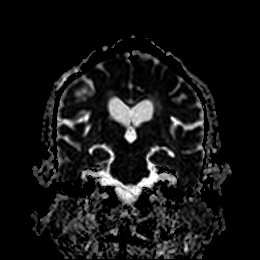
[im 37/37]
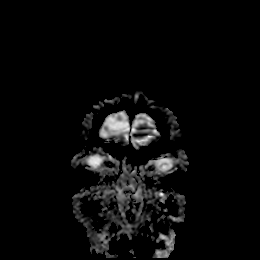

[Series 9: T1 · sagittal · 5.0mm · 0.75mm/px · 2 of 24 slices shown]
[im 1/24]
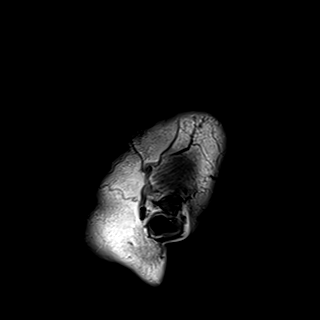
[im 24/24]
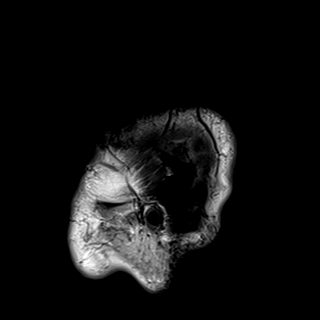

[Series 10: T2 · axial · 5.0mm · 0.72mm/px · z∈[+7,+147]mm · 2 of 25 slices shown (1 of 2)]
[im 1/25]
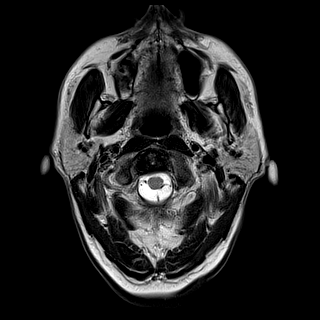
[im 25/25]
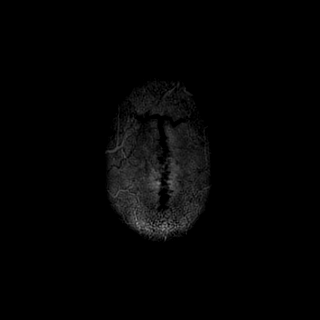

[Series 11: FLAIR · axial · 5.0mm · 0.45mm/px · z∈[+7,+146]mm · 2 of 25 slices shown]
[im 1/25]
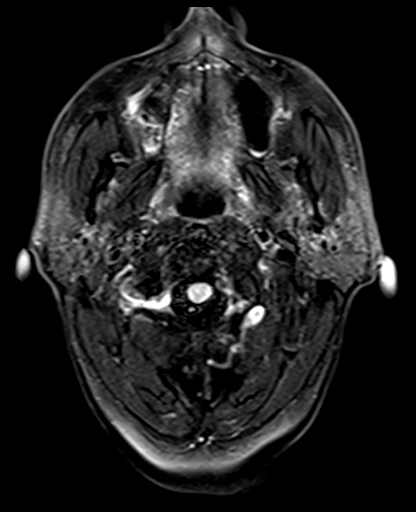
[im 25/25]
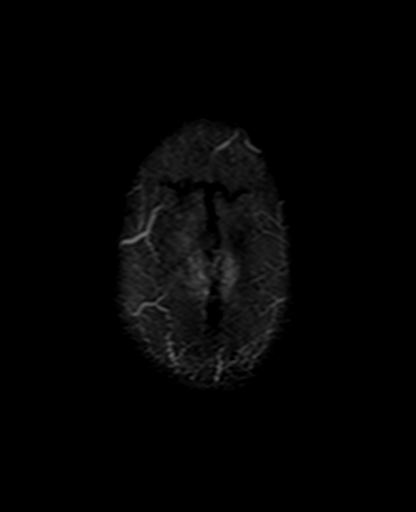

[Series 12: mag_images · axial · 3.0mm · 0.90mm/px · z∈[-3,+168]mm · 4 of 60 slices shown]
[im 1/60]
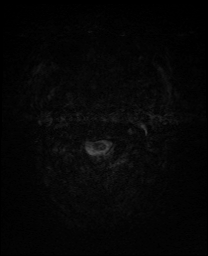
[im 20/60]
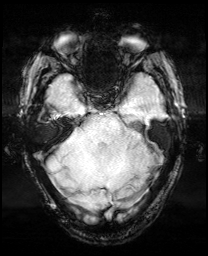
[im 40/60]
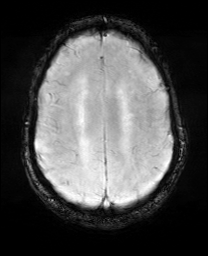
[im 60/60]
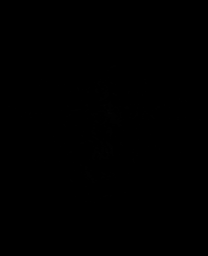

[Series 13: pha_images · axial · 3.0mm · 0.90mm/px · z∈[-3,+165]mm · 4 of 57 slices shown]
[im 1/57]
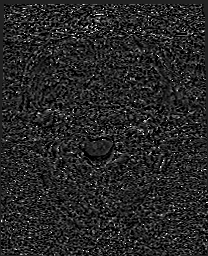
[im 19/57]
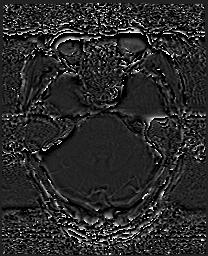
[im 38/57]
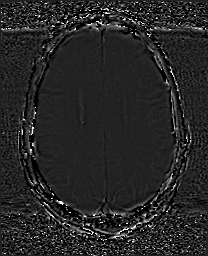
[im 57/57]
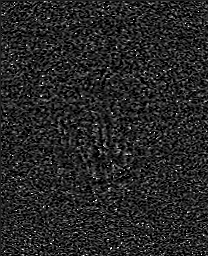

[Series 14: swi_images · axial · 3.0mm · 0.90mm/px · z∈[-3,+168]mm · 4 of 60 slices shown]
[im 1/60]
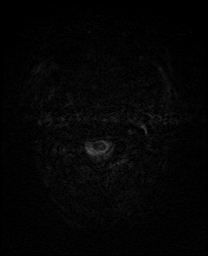
[im 20/60]
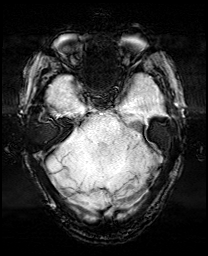
[im 40/60]
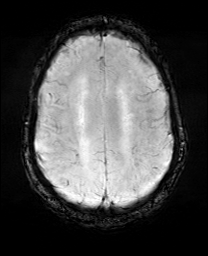
[im 60/60]
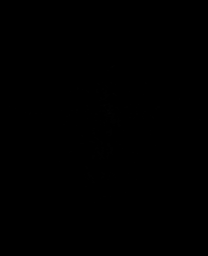

[Series 15: mip_images(sw) · axial · 24.0mm · 0.90mm/px · z∈[+7,+158]mm · 4 of 53 slices shown]
[im 1/53]
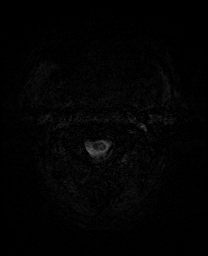
[im 18/53]
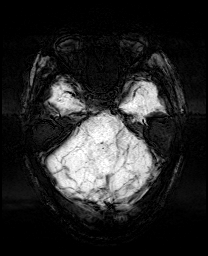
[im 35/53]
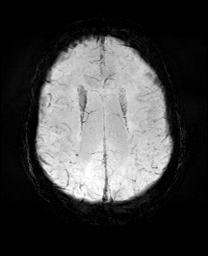
[im 53/53]
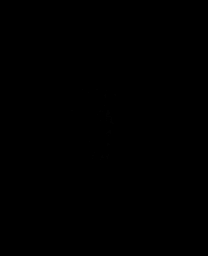

[Series 17: T2 · coronal · 5.0mm · 0.34mm/px · 2 of 31 slices shown (2 of 2)]
[im 1/31]
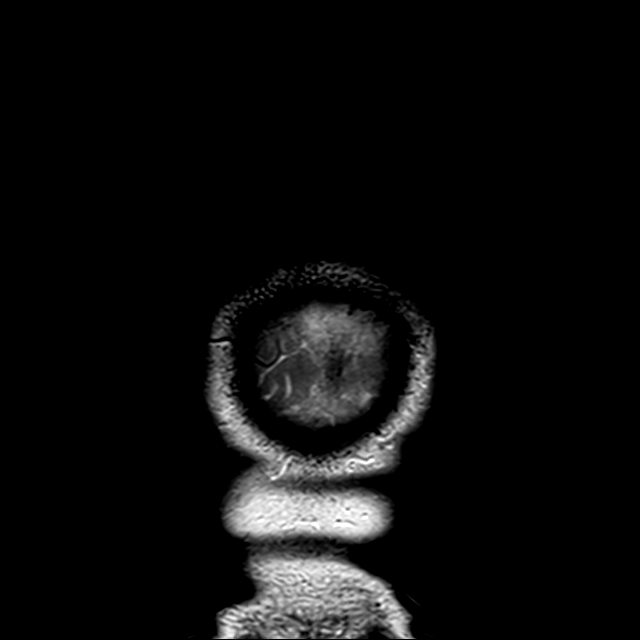
[im 31/31]
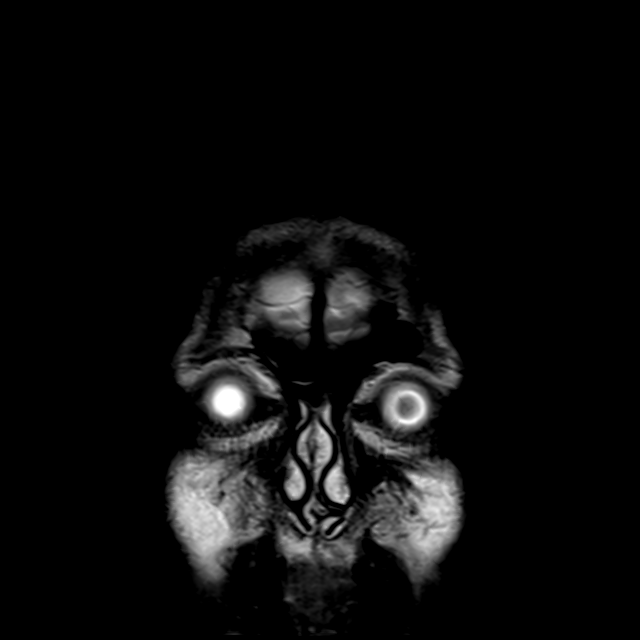

[44 of 48 positions shown; findings below may reference images not displayed]

FINDINGS: Brain: Diffuse prominence of the CSF containing spaces compatible
with generalized age-related cerebral atrophy. Patchy and confluent
T2/FLAIR hyperintensity within the periventricular deep white matter
both cerebral hemispheres most consistent with chronic small vessel
ischemic disease, moderate nature. Superimposed remote lacunar
infarcts noted at the caudate heads bilaterally.

No abnormal foci of restricted diffusion to suggest acute or
subacute ischemia. Gray-white matter differentiation maintained. No
encephalomalacia to suggest chronic cortical infarction. No foci of
susceptibility artifact to suggest acute or chronic intracranial
hemorrhage.

No mass lesion, midline shift or mass effect. No hydrocephalus. No
extra-axial fluid collection.

Pituitary gland within normal limits. Midline structures intact.
Incidental note made of a subcentimeter simple pineal cyst, of
doubtful significance.

Vascular: Major intracranial vascular flow voids are maintained.

Skull and upper cervical spine: Craniocervical junction within
normal limits. Bone marrow signal intensity normal. No scalp soft
tissue abnormality.

Sinuses/Orbits: Patient status post ocular lens replacement on the
right. Globes orbital soft tissues demonstrate no acute finding.
Paranasal sinuses are largely clear. Trace bilateral mastoid
effusions, of doubtful significance. Negative nasopharynx. Inner ear
structures normal.

Other: None.
IMPRESSION: 1. No acute intracranial infarct or other abnormality identified.
2. Age-related cerebral atrophy with moderate chronic microvascular
ischemic disease, with superimposed small remote lacunar infarcts
involving the bilateral caudate nuclei.

## 2019-08-25 IMAGING — CT CT ANGIO HEAD
2 of 7 series · 8 of 33 positions shown · IV contrast (omnipaque)
Comparison: Prior CT from earlier same day.

CLINICAL DATA: Initial evaluation for acute altered mental status,
aphasia.

EXAM:
CT ANGIOGRAPHY HEAD AND NECK
TECHNIQUE: Multidetector CT imaging of the head and neck was performed using
the standard protocol during bolus administration of intravenous
contrast. Multiplanar CT image reconstructions and MIPs were
obtained to evaluate the vascular anatomy. Carotid stenosis
measurements (when applicable) are obtained utilizing NASCET
criteria, using the distal internal carotid diameter as the
denominator.
CONTRAST:  100mL OMNIPAQUE IOHEXOL 350 MG/ML SOLN

[Series 5: cta neck/head · axial · 0.64mm/px · z∈[+1061,+1183]mm · 2 of 184 slices shown]
[im 62/184  soft-tissue]
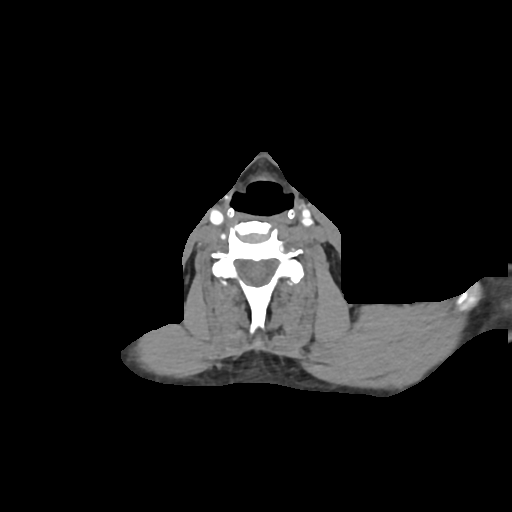
[im 123/184  soft-tissue]
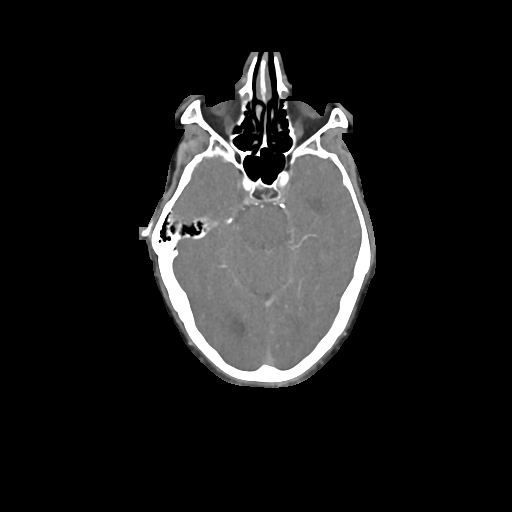

[Series 7: ax thins · axial · 0.39mm/px · z∈[+991,+1251]mm · 6 of 366 slices shown]
[im 53/366  soft-tissue]
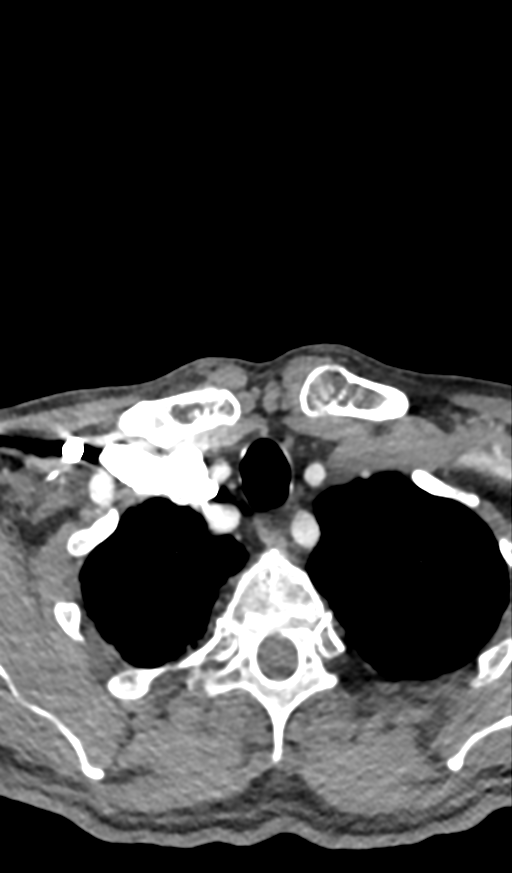
[im 105/366  bone]
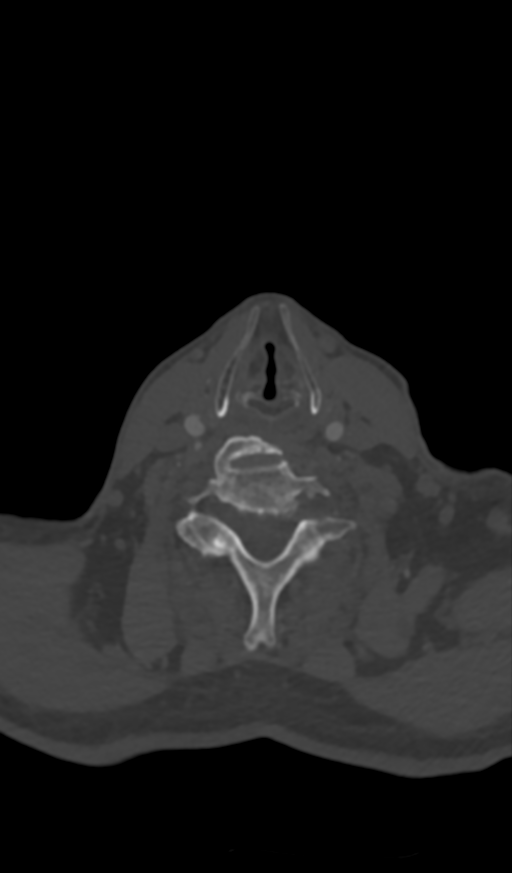
[im 157/366  soft-tissue]
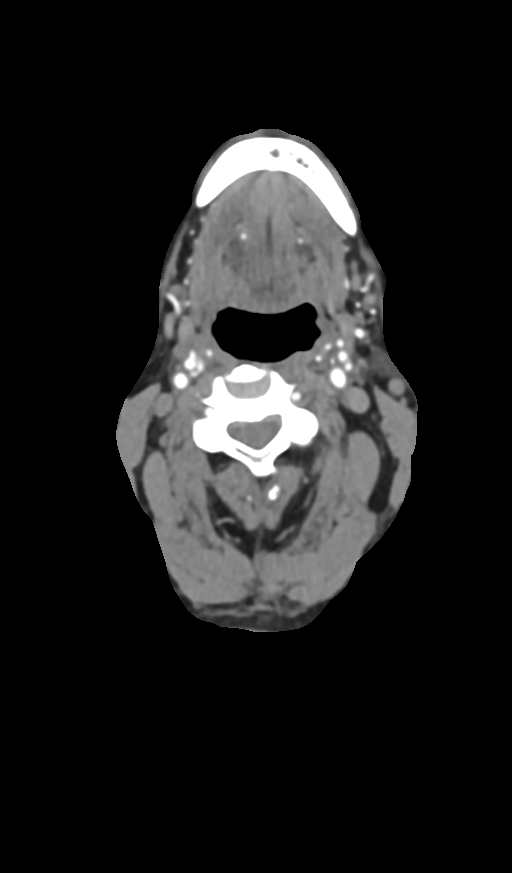
[im 209/366  bone]
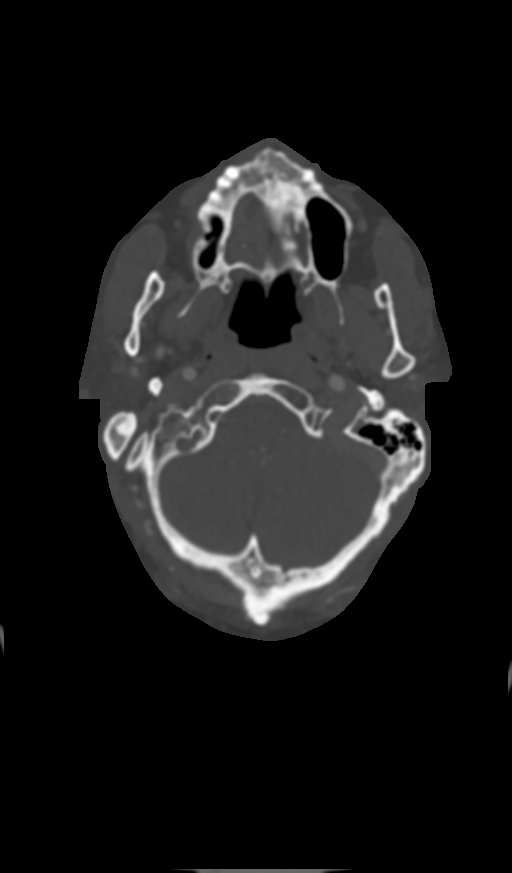
[im 261/366  soft-tissue]
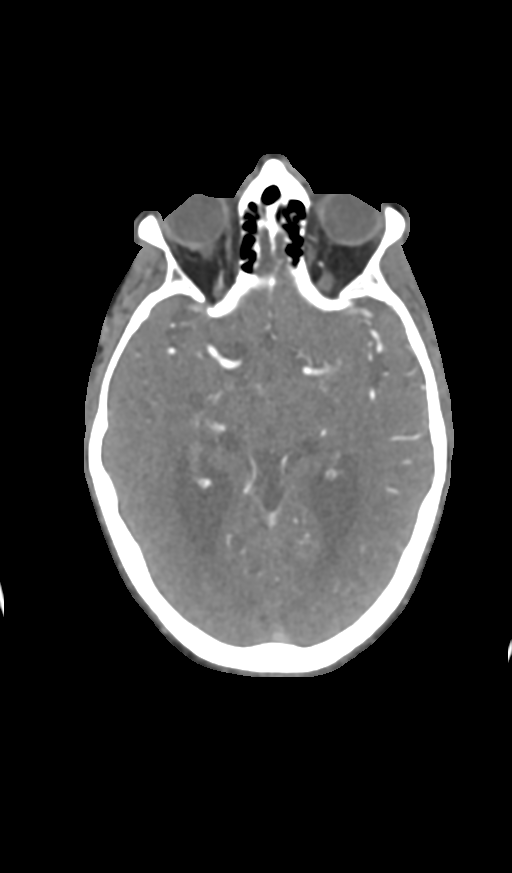
[im 313/366  bone]
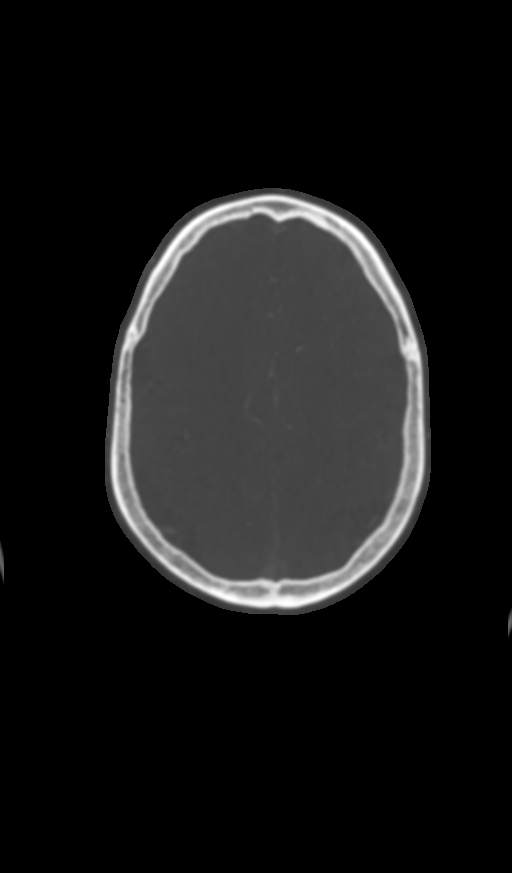

[8 of 33 positions shown; findings below may reference images not displayed]

FINDINGS: CTA NECK FINDINGS

Aortic arch: Visualized aortic arch of normal caliber with normal 3
vessel morphology. Mild atheromatous plaque within the distal aspect
of the arch. No hemodynamically significant stenosis seen about the
origin of the great vessels. Visualized subclavian arteries widely
patent.

Right carotid system: Mild atheromatous change within the right CCA
which is patent from its origin to the bifurcation without stenosis.
Multifocal mixed plaque about the proximal right ICA with associated
stenosis of up to 65% by NASCET criteria. Right ICA otherwise widely
patent distally to the skull base without stenosis, dissection, or
occlusion.

Left carotid system: Left CCA patent from its origin to the
bifurcation without flow-limiting stenosis. Mixed plaque about the
proximal left ICA with associated stenosis of up to 55% by NASCET
criteria left ICA otherwise widely patent to the skull base without
stenosis, dissection or occlusion.

Vertebral arteries: Both of the vertebral arteries arise from the
subclavian arteries. Right vertebral artery slightly dominant.
Vertebral arteries patent within the neck without stenosis,
dissection, or occlusion.

Skeleton: No acute osseous abnormality. No discrete lytic or blastic
osseous lesions.

Other neck: No other acute soft tissue abnormality within the neck.
Small amount of layering secretions noted layering within the
subglottic trachea. Submandibular glands not visualize, and may be
surgically absent.

Upper chest: Visualized upper chest demonstrates no acute finding.

Review of the MIP images confirms the above findings

CTA HEAD FINDINGS

Anterior circulation: Petrous segments widely patent bilaterally.
Minor atheromatous change within the cavernous/supraclinoid ICAs
without hemodynamically significant stenosis. A1 segments widely
patent. Normal anterior communicating artery. Anterior cerebral
arteries widely patent to their distal aspects without stenosis. No
M1 stenosis or occlusion. Normal MCA bifurcations. Distal MCA
branches well perfused and symmetric.

Posterior circulation: Vertebral arteries diminutive but patent to
the vertebrobasilar junction without stenosis. Posteroinferior
cerebral arteries patent bilaterally. Basilar diminutive but patent
to its distal aspect without stenosis. Superior cerebral arteries
patent proximally. Predominant fetal type origin of the PCAs, both
of which are widely patent to their distal aspects without stenosis.

Venous sinuses: Grossly patent allowing for timing the contrast
bolus.

Anatomic variants: Fetal type origin of the PCAs with diminutive
vertebrobasilar system.

Review of the MIP images confirms the above findings
IMPRESSION: 1. Negative CTA for large vessel occlusion.
2. Atherosclerotic change about the proximal cervical ICAs
bilaterally, with associated stenoses of up to 65% on the right and
55% on the left.
3. No hemodynamically significant or correctable stenosis within the
intracranial circulation.
4. Fetal type origin of the PCAs with overall diminutive
vertebrobasilar system.

## 2019-08-25 IMAGING — CT CT ANGIO NECK
2 of 7 series · 8 of 33 positions shown · IV contrast (APPLIED)
Comparison: Prior CT from earlier same day.

CLINICAL DATA: Initial evaluation for acute altered mental status,
aphasia.

EXAM:
CT ANGIOGRAPHY HEAD AND NECK
TECHNIQUE: Multidetector CT imaging of the head and neck was performed using
the standard protocol during bolus administration of intravenous
contrast. Multiplanar CT image reconstructions and MIPs were
obtained to evaluate the vascular anatomy. Carotid stenosis
measurements (when applicable) are obtained utilizing NASCET
criteria, using the distal internal carotid diameter as the
denominator.
CONTRAST:  100mL OMNIPAQUE IOHEXOL 350 MG/ML SOLN

[Series 5: cta neck/head · axial · 0.64mm/px · z∈[+1061,+1183]mm · 2 of 184 slices shown]
[im 62/184  soft-tissue]
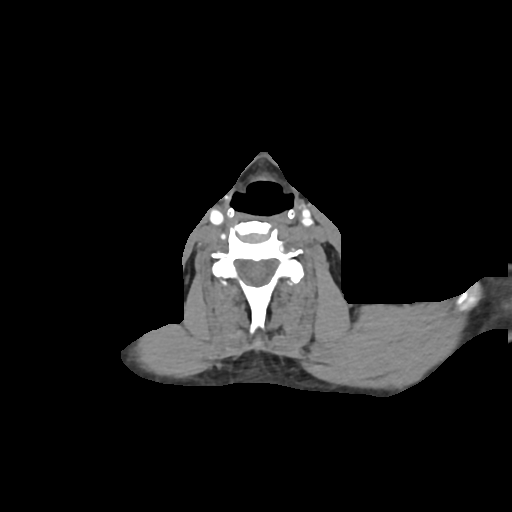
[im 123/184  soft-tissue]
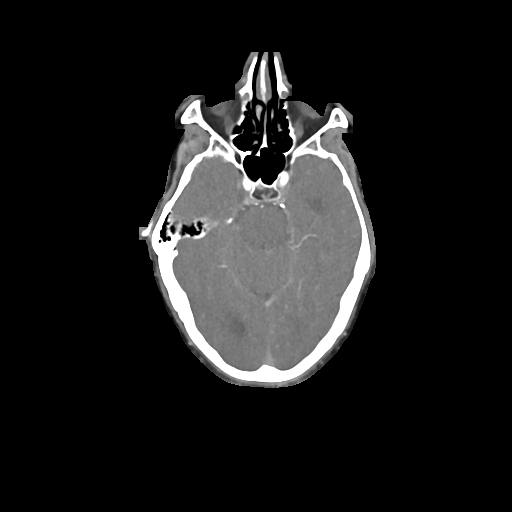

[Series 7: ax thins · axial · 0.39mm/px · z∈[+991,+1251]mm · 6 of 366 slices shown]
[im 53/366  soft-tissue]
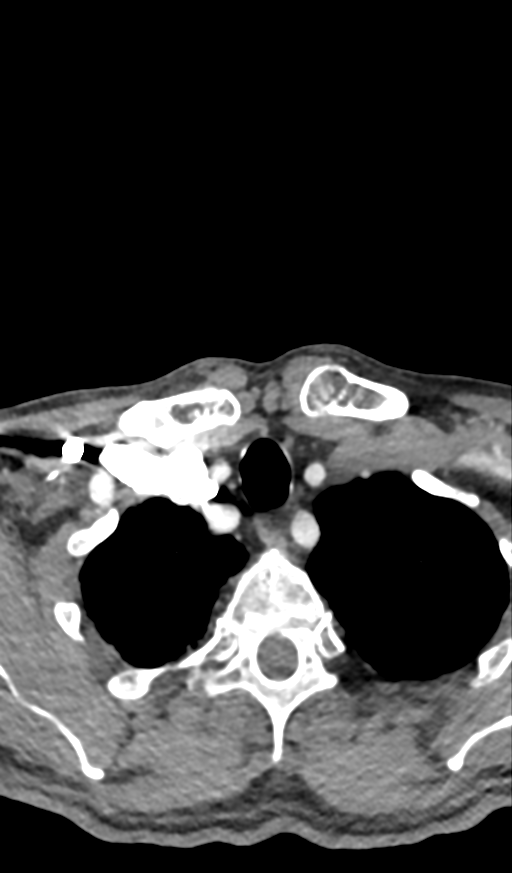
[im 105/366  bone]
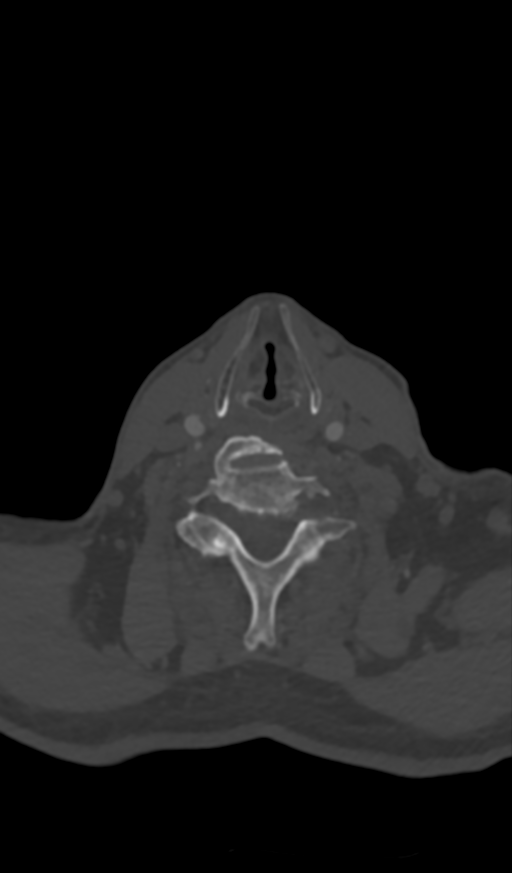
[im 157/366  soft-tissue]
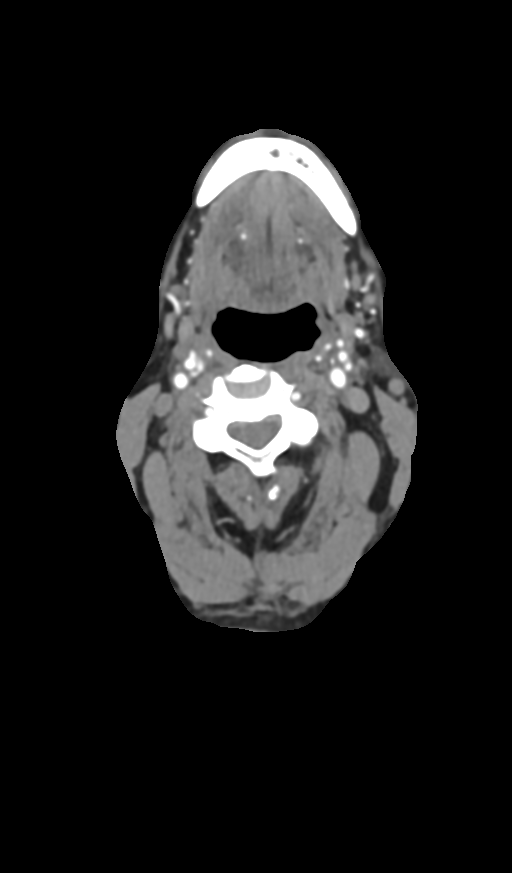
[im 209/366  bone]
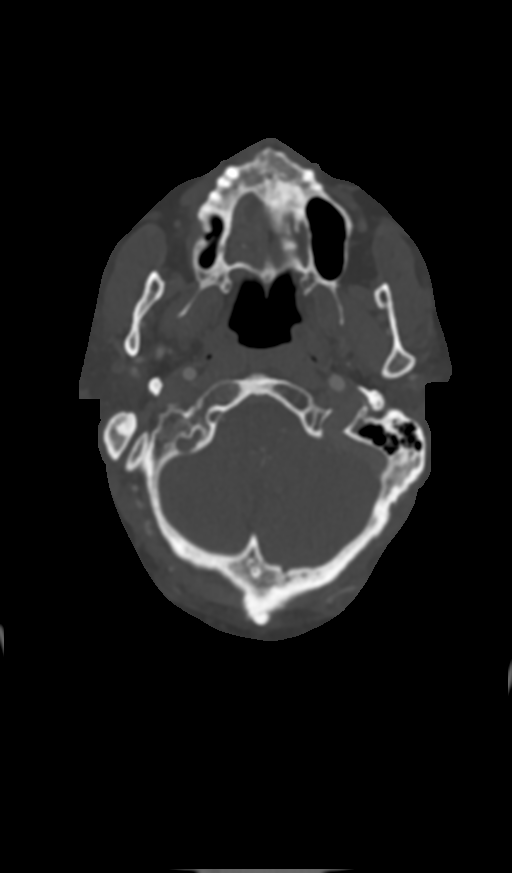
[im 261/366  soft-tissue]
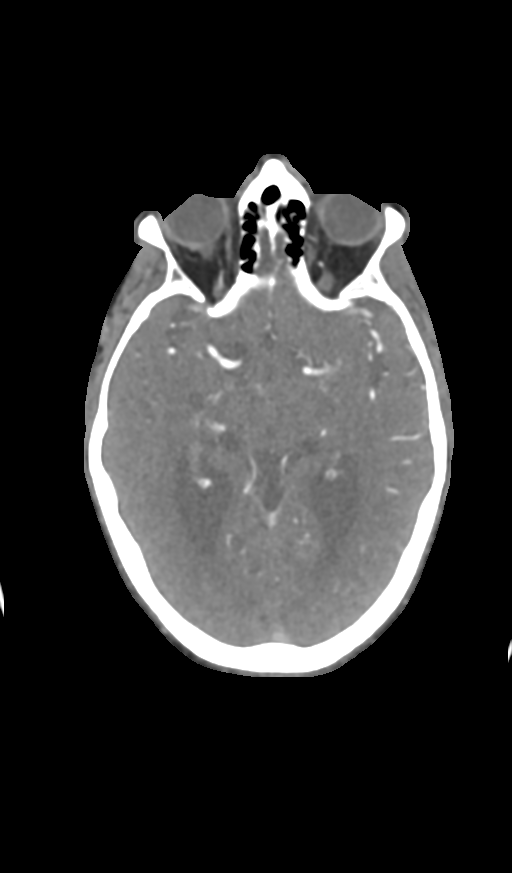
[im 313/366  bone]
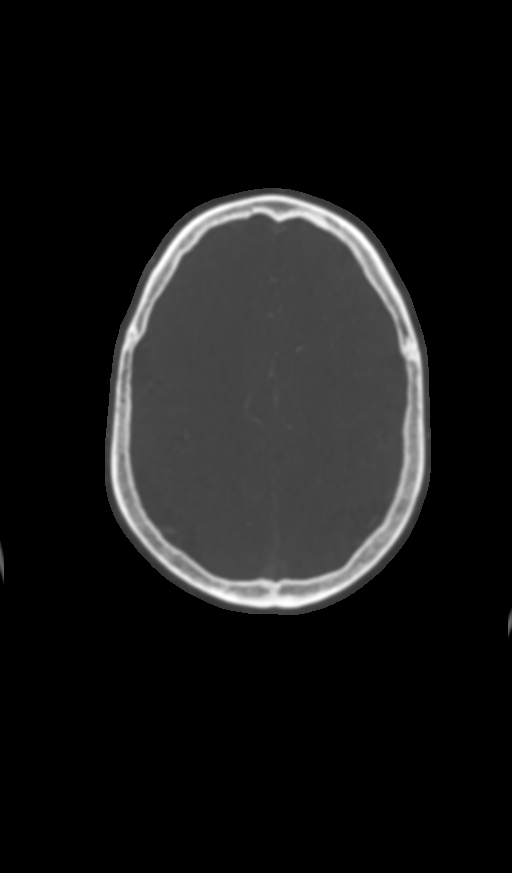

[8 of 33 positions shown; findings below may reference images not displayed]

FINDINGS: CTA NECK FINDINGS

Aortic arch: Visualized aortic arch of normal caliber with normal 3
vessel morphology. Mild atheromatous plaque within the distal aspect
of the arch. No hemodynamically significant stenosis seen about the
origin of the great vessels. Visualized subclavian arteries widely
patent.

Right carotid system: Mild atheromatous change within the right CCA
which is patent from its origin to the bifurcation without stenosis.
Multifocal mixed plaque about the proximal right ICA with associated
stenosis of up to 65% by NASCET criteria. Right ICA otherwise widely
patent distally to the skull base without stenosis, dissection, or
occlusion.

Left carotid system: Left CCA patent from its origin to the
bifurcation without flow-limiting stenosis. Mixed plaque about the
proximal left ICA with associated stenosis of up to 55% by NASCET
criteria left ICA otherwise widely patent to the skull base without
stenosis, dissection or occlusion.

Vertebral arteries: Both of the vertebral arteries arise from the
subclavian arteries. Right vertebral artery slightly dominant.
Vertebral arteries patent within the neck without stenosis,
dissection, or occlusion.

Skeleton: No acute osseous abnormality. No discrete lytic or blastic
osseous lesions.

Other neck: No other acute soft tissue abnormality within the neck.
Small amount of layering secretions noted layering within the
subglottic trachea. Submandibular glands not visualize, and may be
surgically absent.

Upper chest: Visualized upper chest demonstrates no acute finding.

Review of the MIP images confirms the above findings

CTA HEAD FINDINGS

Anterior circulation: Petrous segments widely patent bilaterally.
Minor atheromatous change within the cavernous/supraclinoid ICAs
without hemodynamically significant stenosis. A1 segments widely
patent. Normal anterior communicating artery. Anterior cerebral
arteries widely patent to their distal aspects without stenosis. No
M1 stenosis or occlusion. Normal MCA bifurcations. Distal MCA
branches well perfused and symmetric.

Posterior circulation: Vertebral arteries diminutive but patent to
the vertebrobasilar junction without stenosis. Posteroinferior
cerebral arteries patent bilaterally. Basilar diminutive but patent
to its distal aspect without stenosis. Superior cerebral arteries
patent proximally. Predominant fetal type origin of the PCAs, both
of which are widely patent to their distal aspects without stenosis.

Venous sinuses: Grossly patent allowing for timing the contrast
bolus.

Anatomic variants: Fetal type origin of the PCAs with diminutive
vertebrobasilar system.

Review of the MIP images confirms the above findings
IMPRESSION: 1. Negative CTA for large vessel occlusion.
2. Atherosclerotic change about the proximal cervical ICAs
bilaterally, with associated stenoses of up to 65% on the right and
55% on the left.
3. No hemodynamically significant or correctable stenosis within the
intracranial circulation.
4. Fetal type origin of the PCAs with overall diminutive
vertebrobasilar system.

## 2019-08-25 MED ORDER — ACETAMINOPHEN 325 MG PO TABS
650.0000 mg | ORAL_TABLET | Freq: Once | ORAL | Status: AC
  Administered 2019-08-25: 22:00:00 650 mg via ORAL
  Filled 2019-08-25: qty 2

## 2019-08-25 MED ORDER — IOHEXOL 350 MG/ML SOLN
75.0000 mL | Freq: Once | INTRAVENOUS | Status: AC | PRN
  Administered 2019-08-25: 100 mL via INTRAVENOUS

## 2019-08-25 MED ORDER — LACTATED RINGERS IV SOLN
INTRAVENOUS | Status: AC
  Administered 2019-08-25 – 2019-08-26 (×2): via INTRAVENOUS

## 2019-08-25 MED ORDER — SODIUM CHLORIDE 0.9% FLUSH: 3.0000 mL | Freq: Once | INTRAVENOUS | Status: DC

## 2019-08-25 NOTE — Consult Note (Signed)
NEURO HOSPITALIST CONSULT NOTE   Requestig physician: Dr. Hal Hope  Reason for Consult: TIA symptoms  History obtained from:   Patient and Chart     HPI:                                                                                                                                          Arthur Guzman is an 73 y.o. male who presented to the ED via EMS with AMS and aphasia in conjunction with a headache. Symptoms were sudden in onset, beginning on Sunday. On initial assessment by ED staff, the patient was alert and able to state his name, but was disoriented to date and place. ED staff noted expressive and receptive aphasia. He also complained of right wrist numbness. No facial droop or drift was noted.    CT head: 1. Tiny hypodensities within the right basal ganglia and sub insula, possible age indeterminate lacunar infarcts. Otherwise no CT evidence for acute intracranial abnormality. 2. 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 Hypodensity in the white matter consistent with small vessel ischemic change.  CTA head and neck: 1. Negative CTA for large vessel occlusion. 2. 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 3. No hemodynamically significant or correctable stenosis within the intracranial circulation. 4. Fetal type origin of the PCAs with overall diminutive vertebrobasilar system.  MRI brain: 1. No acute intracranial infarct or other abnormality identified. 2. 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   No past medical history on file.   Family History  Problem Relation Age of Onset  . Cancer Mother   . Heart disease Mother               Social History:  reports that he has never smoked. He does  not have any smokeless tobacco history on file. He reports that he does not drink alcohol or use drugs.  Allergies  Allergen Reactions  . Antihistamines, Chlorpheniramine-Type     Antihistamines make me fidgety.    MEDICATIONS:  Tylenol Norco Zofran  ROS:                                                                                                                                       As per HPI. Comprehensive ROS otherwise negative.    Blood pressure (!) 178/93, pulse 79, temperature 98.3 F (36.8 C), temperature source Oral, resp. rate 17, height 5\' 10"  (1.778 m), weight 86.2 kg, SpO2 99 %.   General Examination:                                                                                                       Physical Exam  HEENT-  [place]/AT    Lungs- Respirations unlabored Extremities- No edema  Neurological Examination Mental Status: Alert, fully oriented to time and place. Speech fluent with exception of occasional pauses which wife states are his baseline pattern of speech. Naming intact. Made one error with a three-step directional command. Repetition intact.  Cranial Nerves: II:  Visual fields with no extinction to DSS. Fixates and tracks normally. PERRL.   III,IV, VI: No ptosis. EOMI without nystagmus  V,VII: Face symmetric. Temp sensation equal bilaterally  VIII: hearing intact to voice IX,X: Palate rises symmetrically XI: Symmetric shoulder shrug XII: Midline tongue extension Motor: Right : Upper extremity   5/5    Left:     Upper extremity   5/5  Lower extremity   5/5     Lower extremity   5/5 Normal tone throughout; no atrophy noted Sensory: Temp and light touch intact throughout, bilaterally. No extinction.  Deep Tendon Reflexes: 1+ bilateral biceps and brachioradialis. 1+ patellae bilaterally.  Plantars: Right: downgoing   Left:  downgoing Cerebellar: No ataxia with FNF bilaterally Gait: Deferred   Lab Results: Basic Metabolic Panel: Recent Labs  Lab 08/25/19 1434 08/25/19 1441  NA 138 138  K 3.8 3.7  CL 101 100  CO2 29  --   GLUCOSE 112* 108*  BUN 14 15  CREATININE 0.97 1.00  CALCIUM 9.2  --     CBC: Recent Labs  Lab 08/25/19 1434 08/25/19 1441  WBC 13.7*  --   NEUTROABS 10.8*  --   HGB 13.4 13.6  HCT 39.1 40.0  MCV 90.9  --   PLT 202  --     Cardiac Enzymes: No results for input(s): CKTOTAL, CKMB, CKMBINDEX, TROPONINI in the last 168 hours.  Lipid Panel: No results for input(s): CHOL, TRIG, HDL, CHOLHDL, VLDL, LDLCALC in the last  168 hours.  Imaging: Ct Head Wo Contrast  Result Date: 08/25/2019 CLINICAL DATA:  Aphasia right wrist numbness altered mental status EXAM: CT HEAD WITHOUT CONTRAST TECHNIQUE: Contiguous axial images were obtained from the base of the skull through the vertex without intravenous contrast. COMPARISON:  None. FINDINGS: Brain: No hemorrhage or intracranial mass. Mild hypodensity in the white matter consistent with small vessel ischemic change. 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 Possible age indeterminate lacunar infarcts in the right basal ganglia and sub insula. Mild atrophy. Slight prominence of the ventricles likely related to slight degree of atrophy. Vascular: No hyperdense vessels. Scattered calcifications at the carotid siphon Skull: Normal. Negative for fracture or focal lesion. Sinuses/Orbits: No acute finding. Other: None IMPRESSION: 1. Tiny hypodensities within the right basal ganglia and sub insula, possible age indeterminate lacunar infarcts. Otherwise no CT evidence for acute intracranial abnormality. 2. 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 Hypodensity in the white matter consistent with small vessel ischemic change. Electronically Signed   By: Donavan Foil M.D.   On: 08/25/2019 15:23    Assessment: 74 year old  male with TIA symptoms 1. Exam reveals mild dysphasia, but per wife this is his baseline. 2. CT head reveals tiny hypodensities within the right basal ganglia and sub insula, Possibly representing age indeterminate lacunar infarcts. 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  3. CTA head and neck. No LVO. 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 No hemodynamically significant or correctable stenosis within the intracranial circulation. Fetal type origin of the PCAs with overall diminutive vertebrobasilar system. 4. MRI brain is negative for acute intracranial infarct or other acute abnormality. Age-related cerebral atrophy with moderate chronic microvascular ischemic disease and superimposed small remote lacunar infarcts involving the bilateral caudate nuclei. 5. Overall clinical picture and imaging results are most concerning for TIA.    Recommendations: 1. HgbA1c, fasting lipid panel 2. Carotid ultrasound. May need Vascular Surgery consult for possible CEA 3. PT consult, OT consult, Speech consult 4. Echocardiogram 5. 40 mg of Atorvastatin qd 6. Prophylactic therapy- Start ASA 81 mg po qd  7. Risk factor modification 8. Telemetry monitoring 9. Frequent neuro checks 10. NPO until passes stroke swallow screen   Electronically signed: Dr. Kerney Elbe  08/25/2019, 9:42 PM

## 2019-08-25 NOTE — ED Provider Notes (Signed)
[place] EMERGENCY DEPARTMENT Provider Note   CSN: [id number] Arrival date & time: 08/25/19  1344     History   Chief Complaint Chief Complaint  Patient presents with  . Altered Mental Status  . Aphasia    HPI Arthur Guzman is a 73 y.o. male without significant past medical history, presenting to the emergency department with complaint of difficulty speaking on Sunday.  He states his symptoms began around 4 PM where he felt as though he knew what he needed to say, however he cannot get the words out.  His wife states that he had a glassy appearance to his eyes though no facial droop or slurred speech.  He did seem a little bit disoriented as well.  Patient states he felt a little bit of numbness sensation to his right forearm at the time.  His symptoms had resolved by the next morning.  He did have some headache around 3 or 4 AM early Monday morning, however that resolved with over-the-counter medications.  He states he felt well yesterday.  He woke up this morning and attempted to mow the lawn, however the HEB began developing frontal headache again and therefore stopped.  He complains of headache today, however no other neurologic deficits.  No history of stroke.  He denies history of hypertension though has noticed his blood pressure has been running higher over the last couple of months.  He takes no daily medications.  No history of high cholesterol, tobacco use, diabetes.  He is followed by [facility] primary [facility].    The history is provided by the patient and medical records.    History reviewed. No pertinent past medical history.  Patient Active Problem List   Diagnosis Date Noted  . TIA (transient ischemic attack) 08/25/2019    Past Surgical History:  Procedure Laterality Date  . BACK SURGERY     19 95  . PATELLA FRACTURE SURGERY Left         Home Medications    Prior to Admission medications   Medication Sig Start Date End Date Taking?  Authorizing Provider  acetaminophen (TYLENOL) 500 MG tablet Take 500 mg by mouth every 6 (six) hours as needed for mild pain or headache.   Yes [provider]  HYDROcodone-acetaminophen (NORCO) 10-325 MG per tablet Take 1 tablet by mouth every 8 (eight) hours as needed. Patient not taking: Reported on 07/05/2019 09/22/14   Gean Birchwood, DPM  HYDROcodone-acetaminophen (NORCO/VICODIN) 5-325 MG per tablet Take 1 tablet by mouth every 4 (four) hours as needed. Patient not taking: Reported on 07/05/2019 09/22/14   Gean Birchwood, DPM  ondansetron (ZOFRAN ODT) 4 MG disintegrating tablet Take 1 tablet (4 mg total) by mouth every 6 (six) hours as needed. Patient not taking: Reported on 08/25/2019 07/05/19   Ward, Delice Bison, DO    Family History Family History  Problem Relation Age of Onset  . Cancer Mother   . Heart disease Mother     Social History Social History   Tobacco Use  . Smoking status: Never Smoker  Substance Use Topics  . Alcohol use: No  . Drug use: No     Allergies   Antihistamines, chlorpheniramine-type   Review of Systems Review of Systems  Neurological: Positive for speech difficulty, numbness and headaches.  All other systems reviewed and are negative.    Physical Exam Updated Vital Signs BP (!) 178/93 (BP Location: Right Arm)   Pulse 79   Temp 98.3 F (  36.8 C) (Oral)   Resp 17   Ht 5\' 10"  (1.778 m)   Wt 86.2 kg   SpO2 99%   BMI 27.26 kg/m   Physical Exam Vitals signs and nursing note reviewed.  Constitutional:      General: He is not in acute distress.    Appearance: He is well-developed. He is not ill-appearing.  HENT:     Head: Normocephalic and atraumatic.  Eyes:     Conjunctiva/sclera: Conjunctivae normal.  Cardiovascular:     Rate and Rhythm: Normal rate and regular rhythm.  Pulmonary:     Effort: Pulmonary effort is normal. No respiratory distress.     Breath sounds: Normal breath sounds.  Abdominal:     General: Bowel  sounds are normal.     Palpations: Abdomen is soft.     Tenderness: There is no abdominal tenderness.  Skin:    General: Skin is warm.  Neurological:     Mental Status: He is alert.     Comments: Mental Status:  Alert, oriented, thought content appropriate, able to give a coherent history. Speech fluent without evidence of aphasia. Able to follow 2 step commands without difficulty.  Cranial Nerves:  II:  Peripheral visual fields grossly normal, pupils equal, round, reactive to light III,IV, VI: ptosis not present, extra-ocular motions intact bilaterally  V,VII: smile symmetric, facial light touch sensation equal VIII: hearing grossly normal to voice  X: uvula elevates symmetrically  XI: bilateral shoulder shrug symmetric and strong XII: midline tongue extension without fassiculations Motor:  Normal tone. 5/5 in upper and lower extremities bilaterally including strong and equal grip strength and dorsiflexion/plantar flexion Sensory: grossly normal in all extremities.  Cerebellar: normal finger-to-nose with bilateral upper extremities. No pronator drift CV: distal pulses palpable throughout    Psychiatric:        Behavior: Behavior normal.      ED Treatments / Results  Labs (all labs ordered are listed, but only abnormal results are displayed) Labs Reviewed  CBC - Abnormal; Notable for the following components:      Result Value   WBC 13.7 (*)    All other components within normal limits  DIFFERENTIAL - Abnormal; Notable for the following components:   Neutro Abs 10.8 (*)    All other components within normal limits  COMPREHENSIVE METABOLIC PANEL - Abnormal; Notable for the following components:   Glucose, Bld 112 (*)    All other components within normal limits  I-STAT CHEM 8, ED - Abnormal; Notable for the following components:   Glucose, Bld 108 (*)    All other components within normal limits  CBG MONITORING, ED - Abnormal; Notable for the following components:    Glucose-Capillary 136 (*)    All other components within normal limits  SARS CORONAVIRUS 2 (HOSPITAL ORDER, [facility])  PROTIME-INR  APTT    EKG EKG Interpretation  Date/Time:  Tuesday August 25 2019 14:20:21 EDT Ventricular Rate:  62 PR Interval:  162 QRS Duration: 94 QT Interval:  402 QTC Calculation: 408 R Axis:   -36 Text Interpretation:  Normal sinus rhythm Left axis deviation Incomplete right bundle branch block Nonspecific ST abnormality Abnormal ECG similar to prior 7/20 Confirmed by Aletta Edouard [id number]) on 08/25/2019 7:59:30 PM   Radiology Ct Head Wo Contrast  Result Date: 08/25/2019 CLINICAL DATA:  Aphasia right wrist numbness altered mental status EXAM: CT HEAD WITHOUT CONTRAST TECHNIQUE: Contiguous axial images were obtained from the base of the skull through  the vertex without intravenous contrast. COMPARISON:  None. FINDINGS: Brain: No hemorrhage or intracranial mass. Mild hypodensity in the white matter consistent with small vessel ischemic change. 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 Possible age indeterminate lacunar infarcts in the right basal ganglia and sub insula. Mild atrophy. Slight prominence of the ventricles likely related to slight degree of atrophy. Vascular: No hyperdense vessels. Scattered calcifications at the carotid siphon Skull: Normal. Negative for fracture or focal lesion. Sinuses/Orbits: No acute finding. Other: None IMPRESSION: 1. Tiny hypodensities within the right basal ganglia and sub insula, possible age indeterminate lacunar infarcts. Otherwise no CT evidence for acute intracranial abnormality. 2. 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 Hypodensity in the white matter consistent with small vessel ischemic change. Electronically Signed   By: Donavan Foil M.D.   On: 08/25/2019 15:23    Procedures Procedures (including critical care time)  Medications Ordered in ED  Medications  sodium chloride flush (NS) 0.9 % injection 3 mL (has no administration in time range)  acetaminophen (TYLENOL) tablet 650 mg (has no administration in time range)     Initial Impression / Assessment and Plan / ED Course  I have reviewed the triage vital signs and the nursing notes.  Pertinent labs & imaging results that were available during my care of the patient were reviewed by me and considered in my medical decision making (see chart for details).  Clinical Course as of Aug 24 2157  Tue Sep 08, 284  6332 73 year old male brought in for a few days of some aphasia. His head CTshowing CT showing age-indeterminate infarcts.  We will need to review with neurology and admit for further work-up.   [MB]    Clinical Course User Index [MB] Hayden Rasmussen, MD       Patient presenting with an episode of expressive a aphasia on Sunday evening which has since resolved.  He states he has a frontal headache today which she has had similarly in the past.  No other associated symptoms today.  No history of hypertension though he is noted to be hypertensive in the ED.  He does not take any daily medications.  He has no focal neuro deficits on exam today.  His initial work-up revealing a mild leukocytosis of 13.7, otherwise unremarkable lab work.  His CT scan shows neck appearing lacunar infarct.  Patient symptoms are concerning for TIA.  He will be admitted to the hospitalist service for TIA work-up.  Dr. Hal Hope accepting admission.  He is requesting neurology consult, with Dr. Cheral Marker to evaluate patient in the ED provide and neuro recommendations.  The patient appears reasonably stabilized for admission considering the current resources, flow, and capabilities available in the ED at this time, and I doubt any other [facility] requiring further screening and/or treatment in the ED prior to admission.  Final Clinical Impressions(s) / ED Diagnoses   Final diagnoses:  Aphasia  Acute  nonintractable headache, unspecified headache type    ED Discharge Orders    None       , [place] N, PA-C 08/25/19 2158    Hayden Rasmussen, MD 08/26/19 1056

## 2019-08-25 NOTE — H&P (Signed)
History and Physical    Arthur Guzman [id number] DOB: 28-Sep-1946 DOA: 08/25/2019  PCP: Christain Sacramento, MD  Patient coming from: Home.  Chief Complaint: Headache and difficulty speaking.  HPI: Arthur Guzman is a 73 y.o. male with no significant past medical history presents to the ER because of difficulty understanding words and headache.  On August 22 2021 days ago in the evening patient had frontal headache following which patient had an episode of nausea vomiting and had some difficulty understanding words and patient at the time went to sleep.  The following day patient had no symptoms.  Then again this morning around 12 PM patient started having frontal headaches and difficulty understanding think at this time patient's wife also noticed some right-sided weakness with decreased gripping strength.  This lasted for few hours.  Patient presented to the ER.  Also had numbness of the right upper extremity.  On July 05, 2019 patient presented to the ER with similar symptoms with a headache.  ED Course: In the ER patient appeared nonfocal.  CT head showed nonspecific hypodensities concerning for age-indeterminate lacunar infarct.  Patient blood pressure was elevated.  COVID-19 test was negative.  Patient passed swallow.  On-call neurology was consulted.  CT angiogram of the head and neck did not show any large vessel obstruction.  MRI brain is pending.  Patient admitted for TIA.  Lab work show mild leukocytosis.  Review of Systems: As per HPI, rest all negative.   History reviewed. No pertinent past medical history.  Past Surgical History:  Procedure Laterality Date  . BACK SURGERY     1995  . PATELLA FRACTURE SURGERY Left      reports that he has never smoked. He has never used smokeless tobacco. He reports that he does not drink alcohol or use drugs.  Allergies  Allergen Reactions  . Antihistamines, Chlorpheniramine-Type     Antihistamines make me fidgety.    Family  History  Problem Relation Age of Onset  . Cancer Mother   . Heart disease Mother     Prior to Admission medications   Medication Sig Start Date End Date Taking? Authorizing Provider  acetaminophen (TYLENOL) 500 MG tablet Take 500 mg by mouth every 6 (six) hours as needed for mild pain or headache.   Yes [provider]  HYDROcodone-acetaminophen (NORCO) 10-325 MG per tablet Take 1 tablet by mouth every 8 (eight) hours as needed. Patient not taking: Reported on 07/05/2019 09/22/14   Gean Birchwood, DPM  HYDROcodone-acetaminophen (NORCO/VICODIN) 5-325 MG per tablet Take 1 tablet by mouth every 4 (four) hours as needed. Patient not taking: Reported on 07/05/2019 09/22/14   Gean Birchwood, DPM  ondansetron (ZOFRAN ODT) 4 MG disintegrating tablet Take 1 tablet (4 mg total) by mouth every 6 (six) hours as needed. Patient not taking: Reported on 08/25/2019 07/05/19   Ward, Delice Bison, DO    Physical Exam: Constitutional: Moderately built and nourished. Vitals:   08/25/19 2145 08/25/19 2200 08/25/19 2215 08/25/19 2245  BP: (!) 166/79 (!) 163/80 (!) 156/89 (!) 157/73  Pulse:    69  Resp: 18 19 18 17   Temp:      TempSrc:      SpO2:    98%  Weight:      Height:       Eyes: Anicteric no pallor. ENMT: No discharge from the ears eyes nose or mouth. Neck: No mass felt.  No neck rigidity. Respiratory: No rhonchi or crepitations. Cardiovascular:  S1-S2 heard. Abdomen: Soft nontender bowel sounds present. Musculoskeletal: No edema. Skin: No rash. Neurologic: Alert awake oriented to time place and person.  Moves all extremities 5 x 5.  No facial asymmetry tongue is midline pupils are equal and reacting to light. Psychiatric: Appears normal per normal affect.   Labs on Admission: I have personally reviewed following labs and imaging studies  CBC: Recent Labs  Lab 08/25/19 1434 08/25/19 1441  WBC 13.7*  --   NEUTROABS 10.8*  --   HGB 13.4 13.6  HCT 39.1 40.0  MCV 90.9  --    PLT 202  --    Basic Metabolic Panel: Recent Labs  Lab 08/25/19 1434 08/25/19 1441  NA 138 138  K 3.8 3.7  CL 101 100  CO2 29  --   GLUCOSE 112* 108*  BUN 14 15  CREATININE 0.97 1.00  CALCIUM 9.2  --    GFR: Estimated Creatinine Clearance: 67.9 mL/min (by C-G formula based on SCr of 1 mg/dL). Liver Function Tests: Recent Labs  Lab 08/25/19 1434  AST 20  ALT 17  ALKPHOS 70  BILITOT 0.6  PROT 7.3  ALBUMIN 3.7   No results for input(s): LIPASE, AMYLASE in the last 168 hours. No results for input(s): AMMONIA in the last 168 hours. Coagulation Profile: Recent Labs  Lab 08/25/19 1434  INR 1.0   Cardiac Enzymes: No results for input(s): CKTOTAL, CKMB, CKMBINDEX, TROPONINI in the last 168 hours. BNP (last 3 results) No results for input(s): PROBNP in the last 8760 hours. HbA1C: No results for input(s): HGBA1C in the last 72 hours. CBG: Recent Labs  Lab 08/25/19 2020  GLUCAP 136*   Lipid Profile: No results for input(s): CHOL, HDL, LDLCALC, TRIG, CHOLHDL, LDLDIRECT in the last 72 hours. Thyroid Function Tests: No results for input(s): TSH, T4TOTAL, FREET4, T3FREE, THYROIDAB in the last 72 hours. Anemia Panel: No results for input(s): VITAMINB12, FOLATE, FERRITIN, TIBC, IRON, RETICCTPCT in the last 72 hours. Urine analysis:    Component Value Date/Time   COLORURINE YELLOW 07/05/2019 0102   APPEARANCEUR CLEAR 07/05/2019 0102   LABSPEC 1.013 07/05/2019 0102   PHURINE 8.0 07/05/2019 0102   GLUCOSEU NEGATIVE 07/05/2019 0102   HGBUR NEGATIVE 07/05/2019 0102   BILIRUBINUR NEGATIVE 07/05/2019 0102   KETONESUR NEGATIVE 07/05/2019 0102   PROTEINUR NEGATIVE 07/05/2019 0102   NITRITE NEGATIVE 07/05/2019 0102   LEUKOCYTESUR NEGATIVE 07/05/2019 0102   Sepsis Labs: @LABRCNTIP (procalcitonin:4,lacticidven:4) )No results found for this or any previous visit (from the past 240 hour(s)).   Radiological Exams on Admission: Ct Head Wo Contrast  Result Date: 08/25/2019  CLINICAL DATA:  Aphasia right wrist numbness altered mental status EXAM: CT HEAD WITHOUT CONTRAST TECHNIQUE: Contiguous axial images were obtained from the base of the skull through the vertex without intravenous contrast. COMPARISON:  None. FINDINGS: Brain: No hemorrhage or intracranial mass. Mild hypodensity in the white matter consistent with small vessel ischemic change. 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 Possible age indeterminate lacunar infarcts in the right basal ganglia and sub insula. Mild atrophy. Slight prominence of the ventricles likely related to slight degree of atrophy. Vascular: No hyperdense vessels. Scattered calcifications at the carotid siphon Skull: Normal. Negative for fracture or focal lesion. Sinuses/Orbits: No acute finding. Other: None IMPRESSION: 1. Tiny hypodensities within the right basal ganglia and sub insula, possible age indeterminate lacunar infarcts. Otherwise no CT evidence for acute intracranial abnormality. 2. 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 Hypodensity in the white matter consistent  with small vessel ischemic change. Electronically Signed   By: Donavan Foil M.D.   On: 08/25/2019 15:23    EKG: Independently reviewed.  Normal sinus rhythm.  Assessment/Plan Principal Problem:   TIA (transient ischemic attack) Active Problems:   Elevated blood pressure reading    1. TIA -patient symptoms are concerning for TIA.  Discussed with on-call neurologist Dr. Kerney Elbe.  CT angiogram of the head and neck did not show any large vessel obstruction.  MRI brain is pending.  Check 2D echo hemoglobin A1c lipid panel physical therapy consult.  Patient passed swallow.  And aspirin. 2. Elevated blood pressure readings -closely follow blood pressure trends. 3. Leukocytosis patient is presently afebrile.  Check urinalysis.   DVT prophylaxis: Lovenox. Code Status: Full code. Family Communication: Patient's wife. Disposition  Plan: Home. Consults called: Neurologist. Admission status: Observation.   Rise Patience MD [facility] Pager [phone].  If 7PM-7AM, please contact night-coverage [email or web address] Password [facility]  08/25/2019, 11:04 PM

## 2019-08-25 NOTE — ED Triage Notes (Signed)
Pt arrives via EMs from home with altered mental status, aphasia, sudden onset Sunday, headache. Pt alert, able to state name, disoriented to date, place, has expressive and receptive aphasia, wife reports symptoms began Sunday, was c/o right wrist numbness. No droop, drift, unable to assess slurring speech due to inability to repeat phrases.

## 2019-08-26 ENCOUNTER — Other Ambulatory Visit: Payer: Self-pay

## 2019-08-26 ENCOUNTER — Ambulatory Visit (HOSPITAL_BASED_OUTPATIENT_CLINIC_OR_DEPARTMENT_OTHER): Payer: Medicare Other

## 2019-08-26 ENCOUNTER — Observation Stay (HOSPITAL_BASED_OUTPATIENT_CLINIC_OR_DEPARTMENT_OTHER): Payer: Medicare Other

## 2019-08-26 DIAGNOSIS — D72829 Elevated white blood cell count, unspecified: Secondary | ICD-10-CM | POA: Diagnosis not present

## 2019-08-26 DIAGNOSIS — G459 Transient cerebral ischemic attack, unspecified: Secondary | ICD-10-CM

## 2019-08-26 DIAGNOSIS — G43109 Migraine with aura, not intractable, without status migrainosus: Secondary | ICD-10-CM

## 2019-08-26 LAB — ECHOCARDIOGRAM COMPLETE
Height: 70 in
Weight: 3040 oz

## 2019-08-26 MED ORDER — STROKE: EARLY STAGES OF RECOVERY BOOK
Freq: Once | Status: AC
  Administered 2019-08-26: 18:00:00
  Filled 2019-08-26: qty 1

## 2019-08-26 MED ORDER — DIVALPROEX SODIUM 250 MG PO DR TAB
500.0000 mg | DELAYED_RELEASE_TABLET | Freq: Every day | ORAL | Status: DC
  Administered 2019-08-26: 500 mg via ORAL
  Filled 2019-08-26: qty 2

## 2019-08-26 MED ORDER — ACETAMINOPHEN 650 MG RE SUPP: 650.0000 mg | RECTAL | Status: DC | PRN

## 2019-08-26 MED ORDER — ACETAMINOPHEN 160 MG/5ML PO SOLN: 650.0000 mg | ORAL | Status: DC | PRN

## 2019-08-26 MED ORDER — ACETAMINOPHEN 325 MG PO TABS: 650.0000 mg | ORAL_TABLET | ORAL | Status: DC | PRN

## 2019-08-26 MED ORDER — ENOXAPARIN SODIUM 40 MG/0.4ML ~~LOC~~ SOLN: 40.0000 mg | SUBCUTANEOUS | Status: DC

## 2019-08-26 MED ORDER — ASPIRIN 325 MG PO TABS: 325.0000 mg | ORAL_TABLET | Freq: Every day | ORAL | Status: DC

## 2019-08-26 MED ORDER — ASPIRIN EC 81 MG PO TBEC
81.0000 mg | DELAYED_RELEASE_TABLET | Freq: Every day | ORAL | Status: DC
  Administered 2019-08-26: 81 mg via ORAL
  Filled 2019-08-26 (×2): qty 1

## 2019-08-26 MED ORDER — ASPIRIN 300 MG RE SUPP: 300.0000 mg | Freq: Every day | RECTAL | Status: DC

## 2019-08-26 MED ORDER — CLOPIDOGREL BISULFATE 75 MG PO TABS
75.0000 mg | ORAL_TABLET | Freq: Every day | ORAL | Status: DC
  Administered 2019-08-26: 75 mg via ORAL
  Filled 2019-08-26 (×2): qty 1

## 2019-08-26 MED ORDER — ATORVASTATIN CALCIUM 40 MG PO TABS
40.0000 mg | ORAL_TABLET | Freq: Every day | ORAL | Status: DC
  Administered 2019-08-26: 40 mg via ORAL
  Filled 2019-08-26: qty 1

## 2019-08-26 NOTE — Progress Notes (Signed)
  Echocardiogram 2D Echocardiogram has been performed.  Darlina Sicilian M 08/26/2019, 11:56 AM

## 2019-08-26 NOTE — Progress Notes (Signed)
08/26/19 1816  PT Visit Information  Last PT Received On 08/26/19  Assistance Needed +1  History of Present Illness Pt is a 73 y/o male admitted with TIA symptoms including altered mental status, aphasia, sudden onset headache. 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  Precautions  Precautions Fall  Restrictions  Weight Bearing Restrictions No  Home Living  Family/patient expects to be discharged to: Private residence  Living Arrangements Spouse/significant other  Available Help at Discharge Family;Available 24 hours/day  Type of Home House  Home Access Stairs to enter  Entrance Stairs-Number of Steps 2, and then 1  Entrance Stairs-Rails Right  Home Layout One level  Bathroom Child psychotherapist (comfort height)  Home Equipment Other (comment) (walking stick)  Additional Comments Pt slow to answer questions, as spouse not in room to assist with home information.   Prior Function  Level of Independence Independent  Comments doing yardwork, working on cars, Corporate investment banker No difficulties  Pain Assessment  Pain Assessment No/denies pain  Cognition  Arousal/Alertness Awake/alert  Behavior During Therapy WFL for tasks assessed/performed  Overall Cognitive Status Impaired/Different from baseline  Area of Impairment Memory;Attention;Problem solving  Current Attention Level Alternating  Memory Decreased short-term memory  Problem Solving Slow processing;Requires verbal cues;Requires tactile cues  General Comments Pt with difficulty following simple commands of DGI. When asked to look to his L, pt would physically turn and ambulate to the L. Pt with slowed processing and required multimodal cues to perform higher level DGI tasks. Easily distracted during gait.   Upper  Extremity Assessment  Upper Extremity Assessment Defer to OT evaluation  Lower Extremity Assessment  Lower Extremity Assessment Overall WFL for tasks assessed  Bed Mobility  Overal bed mobility Modified Independent  Transfers  Overall transfer level Needs assistance  Equipment used None  Transfers Sit to/from Stand  Sit to Stand Supervision  General transfer comment supervision for safety.   Ambulation/Gait  Ambulation/Gait assistance Min guard;Min assist  Gait Distance (Feet) 200 Feet  Assistive device None  Gait Pattern/deviations Step-through pattern;Decreased stride length;Drifts right/left  General Gait Details Pt easily distracted during gait. Mild instability noted requiring min guard to min A for steadying. Pt with difficulty following commands of DGI.   Gait velocity Decreased  Balance  Overall balance assessment Needs assistance  Sitting-balance support Feet supported  Sitting balance-Leahy Scale Good  Standing balance support No upper extremity supported  Standing balance-Leahy Scale Fair  Standardized Balance Assessment  Standardized Balance Assessment  Dynamic Gait Index  Dynamic Gait Index  Level Surface 2  Step Over Obstacle 2  Step Around Obstacles 2  PT - End of Session  Equipment Utilized During Treatment Gait belt  Activity Tolerance Patient tolerated treatment well  Patient left in bed;with call bell/phone within reach;with bed alarm set  Nurse Communication Mobility status  PT Assessment  PT Recommendation/Assessment Patient needs continued PT services  PT Visit Diagnosis Unsteadiness on feet (R26.81);Other symptoms and signs involving the nervous system (R29.898)  PT Problem List Decreased cognition;Decreased safety awareness;Decreased knowledge of precautions;Decreased balance;Decreased mobility  PT Plan  PT Frequency (ACUTE ONLY) Min 3X/week  PT Treatment/Interventions (ACUTE ONLY) Gait training;Stair training;Functional mobility training;Therapeutic  exercise;Therapeutic activities;Balance training;Patient/family education;Cognitive remediation  AM-PAC PT "6 Clicks" Mobility Outcome Measure (Version 2)  Help needed turning from your back to your side while in a flat  bed without using bedrails? 4  Help needed moving from lying on your back to sitting on the side of a flat bed without using bedrails? 4  Help needed moving to and from a bed to a chair (including a wheelchair)? 3  Help needed standing up from a chair using your arms (e.g., wheelchair or bedside chair)? 4  Help needed to walk in hospital room? 3  Help needed climbing 3-5 steps with a railing?  3  6 Click Score 21  Consider Recommendation of Discharge To: Home with no services  PT Recommendation  Follow Up Recommendations No PT follow up;Supervision/Assistance - 24 hour (24/7 initially)  PT equipment None recommended by PT  Individuals Consulted  Consulted and Agree with Results and Recommendations Patient  Acute Rehab PT Goals  Patient Stated Goal home tomorrow  PT Goal Formulation With patient  Time For Goal Achievement 09/09/19  Potential to Achieve Goals Good  PT Time Calculation  PT Start Time (ACUTE ONLY) 1740  PT Stop Time (ACUTE ONLY) 1755  PT Time Calculation (min) (ACUTE ONLY) 15 min  PT General Charges  $$ ACUTE PT VISIT 1 Visit  PT Evaluation  $PT Eval Low Complexity 1 Low  Written Expression  Dominant Hand Right   Pt admitted secondary to problem above with deficits below. Pt presenting with slowed processing and difficulty following commands during DGI. Pt with mild instability requiring min to min guard A and was easily distracted during gait. No overt LOB noted. Pt reports wife available to assist at d/c as needed. Anticipate he will progress well and will not require follow up PT. Will continue to follow acutely to maximize functional mobility independence and safety.   Leighton Ruff, PT, DPT  Acute Rehabilitation Services  Pager: [phone] Office: [phone]

## 2019-08-26 NOTE — ED Notes (Signed)
Family at bedside. 

## 2019-08-26 NOTE — Evaluation (Signed)
Occupational Therapy Evaluation Patient Details Name: Arthur Guzman MRN: [id number] DOB: [birth year] Today's Date: 08/26/2019    History of Present Illness Pt is a 73 y/o male admitted with TIA symptoms including altered mental status, aphasia, sudden onset headache. 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   Clinical Impression   This 73 y/o male presents with the above. PTA pt very independent with ADL, iADL and functional mobility. Pt presents seated in recliner with spouse present pleasant and willing to participate in therapy session. Pt currently requiring minguard assist with functional mobility without AD in room and hallway. Pt with x1 LOB during mobility requiring minA to correct. He currently is mod independent with seated UB ADL, requiring minguard assist for LB ADL. ?Question higher level cognitive impairments as well, pt often looking to spouse for confirmation of information provided to therapist, and pt endorses feeling his thinking is not quite at his baseline. Will continue to assess. He will benefit from continued acute OT services to further assess cognition and to maximize pt's overall safety and independence with ADL and mobility. Will follow.     Follow Up Recommendations  No OT follow up;Supervision/Assistance - 24 hour(24hr initially)    Equipment Recommendations  None recommended by OT           Precautions / Restrictions Precautions Precautions: Fall Restrictions Weight Bearing Restrictions: No      Mobility Bed Mobility Overal bed mobility: Modified Independent             General bed mobility comments: returning to supine end of session, HOB slightl elevated  Transfers Overall transfer level: Needs assistance Equipment used: None Transfers: Sit to/from Stand Sit to Stand:  Supervision         General transfer comment: for safety and balance    Balance Overall balance assessment: Needs assistance Sitting-balance support: Feet supported Sitting balance-Leahy Scale: Good     Standing balance support: No upper extremity supported Standing balance-Leahy Scale: Fair Standing balance comment: overall minguard assist with balane however pt mildly unsteady and with x1 LOB requiring minA to correct                           ADL either performed or assessed with clinical judgement   ADL Overall ADL's : Needs assistance/impaired Eating/Feeding: Independent;Sitting   Grooming: Standing;Supervision/safety   Upper Body Bathing: Sitting;Modified independent   Lower Body Bathing: Min guard;Supervison/ safety;Sit to/from stand   Upper Body Dressing : Modified independent;Sitting   Lower Body Dressing: Min guard;Sit to/from stand   Toilet Transfer: Min guard;Ambulation Toilet Transfer Details (indicate cue type and reason): simulated via transfer to/from recliner and [place] and Hygiene: Min guard;Sit to/from stand       Functional mobility during ADLs: Min guard General ADL Comments: pt with mild unsteadiness during mobility and with x1 LOB requiring minA to correct; ?question higher level cognitive impairments                         Pertinent Vitals/Pain Pain Assessment: No/denies pain     Hand Dominance Right   Extremity/Trunk Assessment Upper Extremity Assessment Upper Extremity Assessment: Overall WFL for tasks assessed   Lower Extremity Assessment Lower Extremity Assessment: Defer to PT evaluation       Communication Communication Communication: No difficulties  Cognition Arousal/Alertness: Awake/alert Behavior During Therapy: WFL for tasks assessed/performed Overall Cognitive Status: Impaired/Different from baseline Area of Impairment: Memory;Attention                    Current Attention Level: Alternating Memory: Decreased short-term memory         General Comments: pt unable to recall events that led to him coming to ED, often looking to spouse for confirmation of information he was telling therapist, difficulty answering some questions. pt was able to find his room with min cues during mobility tasks. he does endorse his thinking feels a little "foggy"    General Comments  spouse present and supportive during session    Exercises     Shoulder Instructions      Home Living Family/patient expects to be discharged to:: Private residence Living Arrangements: Spouse/significant other Available Help at Discharge: Family;Available 24 hours/day Type of Home: House Home Access: Stairs to enter [employer] of Steps: 2, and then 1 Entrance Stairs-Rails: Right Home Layout: One level     Bathroom Shower/Tub: Teacher, early years/pre: Standard(comfort height)     Home Equipment: Other (comment)(walking stick)   Additional Comments: pt providing most of PLOF, however looking to spouse for confirmation of information      Prior Functioning/Environment Level of Independence: Independent        Comments: doing yardwork, working on cars, driving        OT Problem List: Decreased activity tolerance;Impaired balance (sitting and/or standing);Decreased cognition;Decreased safety awareness      OT Treatment/Interventions: Self-care/ADL training;Therapeutic exercise;Neuromuscular education;DME and/or AE instruction;Therapeutic activities;Cognitive remediation/compensation;Patient/family education;Balance training    OT Goals(Current goals can be found in the care plan section) Acute Rehab OT Goals Patient Stated Goal: home tomorrow OT Goal Formulation: With patient Time For Goal Achievement: 09/09/19 Potential to Achieve Goals: Good  OT Frequency: Min 2X/week   Barriers to D/C:            Co-evaluation               AM-PAC OT "6 Clicks" Daily Activity     Outcome Measure Help from another person eating meals?: None Help from another person taking care of personal grooming?: None Help from another person toileting, which includes using toliet, bedpan, or urinal?: None Help from another person bathing (including washing, rinsing, drying)?: None Help from another person to put on and taking off regular upper body clothing?: None Help from another person to put on and taking off regular lower body clothing?: A Little 6 Click Score: 23   End of Session Equipment Utilized During Treatment: Gait belt Nurse Communication: Mobility status  Activity Tolerance: Patient tolerated treatment well Patient left: in bed;with call bell/phone within reach;with family/visitor present  OT Visit Diagnosis: Other symptoms and signs involving the nervous system (R29.898);Unsteadiness on feet (R26.81)                Time: [id number] OT Time Calculation (min): 22 min Charges:  OT General Charges $OT Visit: 1 Visit OT Evaluation $OT Eval Moderate Complexity: 1 Mod  Lou Cal, OT [employer] Pager [phone] Office [phone]   Raymondo Band 08/26/2019, 4:27 PM

## 2019-08-26 NOTE — ED Notes (Signed)
Patient alert and oriented, daughter at bedside.  Patient still answers his birthday one year off.  Although he knows today's date.

## 2019-08-26 NOTE — Progress Notes (Signed)
STROKE TEAM PROGRESS NOTE   INTERVAL HISTORY Wife at bedside.  Recounted HPI with me.  Patient stated that 07/05/2019 he had a terrible headache, associate with nausea vomiting, placed eyes, confusion, not knowing where he is at, difficulty getting words out, lasted more than 10 hours and resolved.  He came to ED for evaluation at that time, CT no acute finding, CTA of the head unremarkable.  He was DC'd from ED after headache resolved.  He again on 08/23/2019 night talking to brother over the phone, had a headache, not able to get words out.  He then went to sleep second day he did not feel well and slept all day.  Yesterday he got up mowing the lawn and again started headache, not able to get words out, slow thinking, right forearm numbness.  Headache and symptoms resolved after in ED treating for headache.  Patient see that he had migraine headache when he was young with dizziness, visual auras, throbbing headache.  Sometimes taking BC powder.  Migraine headache resolved at his 85s.  This time headache more constant achy pain instead of throbbing pain.  Vitals:   08/26/19 1045 08/26/19 1100 08/26/19 1115 08/26/19 1130  BP: (!) 145/75 (!) 159/73 (!) 143/69 139/83  Pulse:      Resp: 19 18 16 18   Temp:      TempSrc:      SpO2:      Weight:      Height:        CBC:  Recent Labs  Lab 08/25/19 1434 08/25/19 1441  WBC 13.7*  --   NEUTROABS 10.8*  --   HGB 13.4 13.6  HCT 39.1 40.0  MCV 90.9  --   PLT 202  --     Basic Metabolic Panel:  Recent Labs  Lab 08/25/19 1434 08/25/19 1441  NA 138 138  K 3.8 3.7  CL 101 100  CO2 29  --   GLUCOSE 112* 108*  BUN 14 15  CREATININE 0.97 1.00  CALCIUM 9.2  --    Lipid Panel: No results found for: CHOL, TRIG, HDL, CHOLHDL, VLDL, LDLCALC HgbA1c: No results found for: HGBA1C Urine Drug Screen: No results found for: LABOPIA, COCAINSCRNUR, LABBENZ, AMPHETMU, THCU, LABBARB  Alcohol Level No results found for: ETH  IMAGING Ct Angio Head W Or Wo  Contrast  Result Date: 08/25/2019 CLINICAL DATA:  Initial evaluation for acute altered mental status, aphasia. EXAM: CT ANGIOGRAPHY HEAD AND NECK TECHNIQUE: Multidetector CT imaging of the head and neck was performed using the standard protocol during bolus administration of intravenous contrast. Multiplanar CT image reconstructions and MIPs were obtained to evaluate the vascular anatomy. Carotid stenosis measurements (when applicable) are obtained utilizing NASCET criteria, using the distal internal carotid diameter as the denominator. CONTRAST:  15mL OMNIPAQUE IOHEXOL 350 MG/ML SOLN COMPARISON:  Prior CT from earlier same day. FINDINGS: CTA NECK FINDINGS Aortic arch: Visualized aortic arch of normal caliber with normal 3 vessel morphology. Mild atheromatous plaque within the distal aspect of the arch. No hemodynamically significant stenosis seen about the origin of the great vessels. Visualized subclavian arteries widely patent. Right carotid system: Mild atheromatous change within the right CCA which is patent from its origin to the bifurcation without stenosis. Multifocal mixed plaque about the proximal right ICA with associated stenosis of up to 65% by NASCET criteria. Right ICA otherwise widely patent distally to the skull base without stenosis, dissection, or occlusion. Left carotid system: Left CCA patent from its origin to  the bifurcation without flow-limiting stenosis. Mixed plaque about the proximal left ICA with associated stenosis of up to 55% by NASCET criteria left ICA otherwise widely patent to the skull base without stenosis, dissection or occlusion. Vertebral arteries: Both of the vertebral arteries arise from the subclavian arteries. Right vertebral artery slightly dominant. Vertebral arteries patent within the neck without stenosis, dissection, or occlusion. Skeleton: No acute osseous abnormality. No discrete lytic or blastic osseous lesions. Other neck: No other acute soft tissue abnormality  within the neck. Small amount of layering secretions noted layering within the subglottic trachea. Submandibular glands not visualize, and may be surgically absent. Upper chest: Visualized upper chest demonstrates no acute finding. Review of the MIP images confirms the above findings CTA HEAD FINDINGS Anterior circulation: Petrous segments widely patent bilaterally. Minor atheromatous change within the cavernous/supraclinoid ICAs without hemodynamically significant stenosis. A1 segments widely patent. Normal anterior communicating artery. Anterior cerebral arteries widely patent to their distal aspects without stenosis. No M1 stenosis or occlusion. Normal MCA bifurcations. Distal MCA branches well perfused and symmetric. Posterior circulation: Vertebral arteries diminutive but patent to the vertebrobasilar junction without stenosis. Posteroinferior cerebral arteries patent bilaterally. Basilar diminutive but patent to its distal aspect without stenosis. Superior cerebral arteries patent proximally. Predominant fetal type origin of the PCAs, both of which are widely patent to their distal aspects without stenosis. Venous sinuses: Grossly patent allowing for timing the contrast bolus. Anatomic variants: Fetal type origin of the PCAs with diminutive vertebrobasilar system. Review of the MIP images confirms the above findings IMPRESSION: 1. Negative CTA for large vessel occlusion. 2. 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 3. No hemodynamically significant or correctable stenosis within the intracranial circulation. 4. Fetal type origin of the PCAs with overall diminutive vertebrobasilar system. Electronically Signed   By: Jeannine Boga M.D.   On: 08/25/2019 23:54   Ct Head Wo Contrast  Result Date: 08/25/2019 CLINICAL DATA:  Aphasia right wrist numbness altered mental status EXAM: CT HEAD WITHOUT CONTRAST  TECHNIQUE: Contiguous axial images were obtained from the base of the skull through the vertex without intravenous contrast. COMPARISON:  None. FINDINGS: Brain: No hemorrhage or intracranial mass. Mild hypodensity in the white matter consistent with small vessel ischemic change. 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 Possible age indeterminate lacunar infarcts in the right basal ganglia and sub insula. Mild atrophy. Slight prominence of the ventricles likely related to slight degree of atrophy. Vascular: No hyperdense vessels. Scattered calcifications at the carotid siphon Skull: Normal. Negative for fracture or focal lesion. Sinuses/Orbits: No acute finding. Other: None IMPRESSION: 1. Tiny hypodensities within the right basal ganglia and sub insula, possible age indeterminate lacunar infarcts. Otherwise no CT evidence for acute intracranial abnormality. 2. 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 Hypodensity in the white matter consistent with small vessel ischemic change. Electronically Signed   By: Donavan Foil M.D.   On: 08/25/2019 15:23   Ct Angio Neck W Or Wo Contrast  Result Date: 08/25/2019 CLINICAL DATA:  Initial evaluation for acute altered mental status, aphasia. EXAM: CT ANGIOGRAPHY HEAD AND NECK TECHNIQUE: Multidetector CT imaging of the head and neck was performed using the standard protocol during bolus administration of intravenous contrast. Multiplanar CT image reconstructions and MIPs were obtained to evaluate the vascular anatomy. Carotid stenosis measurements (when applicable) are obtained utilizing NASCET criteria, using the distal internal carotid diameter as the denominator. CONTRAST:  122mL OMNIPAQUE IOHEXOL 350 MG/ML SOLN COMPARISON:  Prior CT from earlier same day. FINDINGS: CTA NECK FINDINGS Aortic arch: Visualized aortic arch of normal caliber with normal 3 vessel morphology. Mild atheromatous plaque within the distal aspect of the arch. No  hemodynamically significant stenosis seen about the origin of the great vessels. Visualized subclavian arteries widely patent. Right carotid system: Mild atheromatous change within the right CCA which is patent from its origin to the bifurcation without stenosis. Multifocal mixed plaque about the proximal right ICA with associated stenosis of up to 65% by NASCET criteria. Right ICA otherwise widely patent distally to the skull base without stenosis, dissection, or occlusion. Left carotid system: Left CCA patent from its origin to the bifurcation without flow-limiting stenosis. Mixed plaque about the proximal left ICA with associated stenosis of up to 55% by NASCET criteria left ICA otherwise widely patent to the skull base without stenosis, dissection or occlusion. Vertebral arteries: Both of the vertebral arteries arise from the subclavian arteries. Right vertebral artery slightly dominant. Vertebral arteries patent within the neck without stenosis, dissection, or occlusion. Skeleton: No acute osseous abnormality. No discrete lytic or blastic osseous lesions. Other neck: No other acute soft tissue abnormality within the neck. Small amount of layering secretions noted layering within the subglottic trachea. Submandibular glands not visualize, and may be surgically absent. Upper chest: Visualized upper chest demonstrates no acute finding. Review of the MIP images confirms the above findings CTA HEAD FINDINGS Anterior circulation: Petrous segments widely patent bilaterally. Minor atheromatous change within the cavernous/supraclinoid ICAs without hemodynamically significant stenosis. A1 segments widely patent. Normal anterior communicating artery. Anterior cerebral arteries widely patent to their distal aspects without stenosis. No M1 stenosis or occlusion. Normal MCA bifurcations. Distal MCA branches well perfused and symmetric. Posterior circulation: Vertebral arteries diminutive but patent to the vertebrobasilar  junction without stenosis. Posteroinferior cerebral arteries patent bilaterally. Basilar diminutive but patent to its distal aspect without stenosis. Superior cerebral arteries patent proximally. Predominant fetal type origin of the PCAs, both of which are widely patent to their distal aspects without stenosis. Venous sinuses: Grossly patent allowing for timing the contrast bolus. Anatomic variants: Fetal type origin of the PCAs with diminutive vertebrobasilar system. Review of the MIP images confirms the above findings IMPRESSION: 1. Negative CTA for large vessel occlusion. 2. 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 3. No hemodynamically significant or correctable stenosis within the intracranial circulation. 4. Fetal type origin of the PCAs with overall diminutive vertebrobasilar system. Electronically Signed   By: Jeannine Boga M.D.   On: 08/25/2019 23:54   Mr Brain Wo Contrast  Result Date: 08/26/2019 CLINICAL DATA:  Initial evaluation for focal neural deficit, stroke suspected. EXAM: MRI HEAD WITHOUT CONTRAST TECHNIQUE: Multiplanar, multiecho pulse sequences of the brain and surrounding structures were obtained without intravenous contrast. COMPARISON:  Prior CT and CTA from earlier the same day. FINDINGS: Brain: Diffuse prominence of the CSF containing spaces compatible with generalized age-related cerebral atrophy. Patchy and confluent T2/FLAIR hyperintensity within the periventricular deep white matter both cerebral hemispheres most consistent with chronic small vessel ischemic disease, moderate nature. Superimposed remote lacunar infarcts noted at the caudate heads bilaterally. No abnormal foci of restricted diffusion to suggest acute or subacute ischemia. Gray-white matter differentiation maintained. No encephalomalacia to suggest chronic cortical infarction. No foci of susceptibility artifact to  suggest acute or chronic intracranial hemorrhage. No mass lesion, midline shift or  mass effect. No hydrocephalus. No extra-axial fluid collection. Pituitary gland within normal limits. Midline structures intact. 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 Vascular: Major intracranial vascular flow voids are maintained. Skull and upper cervical spine: Craniocervical junction within normal limits. Bone marrow signal intensity normal. No scalp soft tissue abnormality. Sinuses/Orbits: Patient status post ocular lens replacement on the right. Globes orbital soft tissues demonstrate no acute finding. Paranasal sinuses are largely clear. Trace bilateral mastoid effusions, of doubtful significance. Negative nasopharynx. Inner ear structures normal. Other: None. IMPRESSION: 1. No acute intracranial infarct or other abnormality identified. 2. 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 Electronically Signed   By: Jeannine Boga M.D.   On: 08/26/2019 00:57   Vas US Carotid  Result Date: 08/26/2019 Carotid Arterial Duplex Study Comparison Study:  No previous study available for comparison. Performing Technologist: Toma Copier RVS  Examination Guidelines: A complete evaluation includes B-mode imaging, spectral Doppler, color Doppler, and power Doppler as needed of all accessible portions of each vessel. Bilateral testing is considered an integral part of a complete examination. Limited examinations for reoccurring indications may be performed as noted.  Right Carotid Findings: +----------+--------+--------+--------+--------------------+-------------------+           PSV cm/sEDV cm/sStenosisPlaque Description  Comments            +----------+--------+--------+--------+--------------------+-------------------+ CCA Prox  76      11                                                       +----------+--------+--------+--------+--------------------+-------------------+ CCA Distal84      15                                  mild intimal wall                                                         changes with mild                                                         plaque              +----------+--------+--------+--------+--------------------+-------------------+ ICA Prox  154     45      40-59%  heterogenous and    velocities are                                        irregular           elevated primarily  in the proximal to                                                        mid region          +----------+--------+--------+--------+--------------------+-------------------+ ICA Mid   146     24      1-39%   heterogenous        mild plaque with                                                          mildly turbulent                                                          flow                +----------+--------+--------+--------+--------------------+-------------------+ ICA Distal77      24                                  mild turbulent flow +----------+--------+--------+--------+--------------------+-------------------+ ECA       154     48              smooth and          mild plaque on the                                    heterogenous        far wall            +----------+--------+--------+--------+--------------------+-------------------+ +----------+--------+-------+--------+-------------------+           PSV cm/sEDV cmsDescribeArm Pressure (mmHG) +----------+--------+-------+--------+-------------------+ [id number]                                         +----------+--------+-------+--------+-------------------+ +---------+--------+--+--------+-+ VertebralPSV cm/s57EDV  cm/s9 +---------+--------+--+--------+-+  Left Carotid Findings: +----------+--------+--------+--------+---------------------+------------------+           PSV cm/sEDV cm/sStenosisPlaque Description   Comments           +----------+--------+--------+--------+---------------------+------------------+ CCA Prox  108     15                                   mild intimal wall                                                         changes            +----------+--------+--------+--------+---------------------+------------------+ CCA Distal75      13  mild intimal wall                                                         changes            +----------+--------+--------+--------+---------------------+------------------+ ICA Prox  117     29      1-39%   heterogenous and     mild to moderate                                     smooth               plaque noted on                                                           the near and far                                                          wall, however                                                             primarily on the                                                          far wall           +----------+--------+--------+--------+---------------------+------------------+ ICA Mid   90      25                                   mild intimal wall                                                         changes            +----------+--------+--------+--------+---------------------+------------------+ ICA Distal81      24                                                      +----------+--------+--------+--------+---------------------+------------------+ ECA       144     3  heterogenous and     mild plaque in the                                   smooth               far wall, mild                                                             intimal wall                                                              changes            +----------+--------+--------+--------+---------------------+------------------+ +----------+--------+--------+--------+-------------------+           PSV cm/sEDV cm/sDescribeArm Pressure (mmHG) +----------+--------+--------+--------+-------------------+ [id number]                                         +----------+--------+--------+--------+-------------------+ +---------+--------+--+--------+-+ VertebralPSV cm/s62EDV cm/s9 +---------+--------+--+--------+-+  Summary: Right Carotid: 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 Left Carotid: Velocities in the left ICA are consistent with a 1-39% stenosis. Vertebrals:  Bilateral vertebral arteries demonstrate antegrade flow. Subclavians: Normal flow hemodynamics were seen in bilateral subclavian              arteries. *See table(s) above for measurements and observations.     Preliminary     PHYSICAL EXAM  Temp:  [98.5 F (36.9 C)] 98.5 F (36.9 C) (09/09 1400) Pulse Rate:  [65-84] 67 (09/09 1400) Resp:  [13-22] 16 (09/09 1400) BP: (138-178)/(41-93) 156/77 (09/09 1400) SpO2:  [95 %-99 %] 98 % (09/09 1400)  General - Well nourished, well developed, in no apparent distress.  Ophthalmologic - fundi not visualized due to noncooperation.  Cardiovascular - Regular rate and rhythm.  Mental Status -  Level of arousal and orientation to time, place, and person were intact. Language including expression, naming, repetition, comprehension was assessed and found intact. Fund of Knowledge was assessed and was intact.  Cranial Nerves II - XII - II - Visual field intact OU. III, IV, VI - Extraocular movements intact. V - Facial sensation intact bilaterally. VII - facial symmetrical.  VIII - Hard of hearing & vestibular intact bilaterally. X - Palate  elevates symmetrically. XI - Chin turning & shoulder shrug intact bilaterally. XII - Tongue protrusion intact.  Motor Strength - The patient's strength was normal in all extremities and pronator drift was absent.  Bulk was normal and fasciculations were absent.   Motor Tone - Muscle tone was assessed at the neck and appendages and was normal.  Reflexes - The patient's reflexes were symmetrical in all extremities and he had no pathological reflexes.  Sensory - Light touch, temperature/pinprick were assessed and were symmetrical.    Coordination - The patient had normal movements in the hands  with no ataxia or dysmetria.  Tremor was absent.  Gait and Station - deferred.   ASSESSMENT/PLAN [name] is a 73 y.o. male with no significant PMH presenting with AMS, aphasia and HA, R wrist numbness.   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 Chronic caudate head lacunes. White matter small vessel disease.  CTA head & neck no LVO. 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 Fetal origin PCAs w. diminuative VB system.  MRI  No acute infarct. Old B caudate head lacunes.   Carotid Doppler  R 40-59% stenosis  2D Echo EF 60-65%. No source of embolus   LDL pending   HgbA1c pending   Lovenox 40 mg sq daily for VTE prophylaxis  No antithrombotic prior to admission, now on aspirin 81 mg daily, aspirin 325 mg daily and clopidogrel 75 mg daily. Continue DAPT at d/c x 3 weeks then aspirin alone    Therapy recommendations:  pending   Disposition:  pending   Migraine   Patient has a history of migraine at a young age, resolved in his 70s  The current 3 episodes all associate with headache, symptoms resolved after headache resolves.  Concerning for complicated migraine  We will start Depakote 500 mg nightly  Follow-up with neurology clinic.  Elevated BP  Home meds:  none  BP 130-160s today . BP goal  normotensive . Consider antihypertensive if BP does not normalize  Hyperlipidemia  Home meds:  No statin  Started on Lipitor 40 from the ED  LDL pending, goal < 70  Continue statin at discharge  Other Stroke Risk Factors  Advanced age  Other Active Problems  Leukocytosis, WBC 13.7, afebrile  Hospital day # 0  Rosalin Hawking, MD PhD Stroke Neurology 08/26/2019 3:56 PM    To contact Stroke Continuity provider, please refer to [email or web address] After hours, contact General Neurology

## 2019-08-26 NOTE — Progress Notes (Signed)
Bilateral carotid duplex completed. Preliminary results in Chart review CV Proc. [place] Drena Ham,RVS 08/26/2019,12:37 PM

## 2019-08-26 NOTE — Progress Notes (Signed)
[facility] PROGRESS NOTE  Arthur Guzman [id number] DOB: [birth year] DOA: 08/25/2019 PCP: Christain Sacramento, MD  Assessment/Plan: 1. TIA -patient symptoms are concerning for TIA. Symptoms resolved.  CT angiogram of the head and neck did not show any large vessel obstruction.  MRI brain is negative for acute infarct.  Check 2D echo reveals EF 60%, moderately increased left ventricular wall thickness, no wall motion abnormalities,  hemoglobin A1c and lipid panel pending.   Patient passed swallow.  Add aspirin. Evaluated by neuro who opined may need vascular surgery consult. Await stroke team recs.  2. Elevated blood pressure readings - fair control. No hx of same. closely follow blood pressure trends. 3. Leukocytosis patient is presently afebrile.  Urinalysis clear   Code Status: full Family Communication: patient Disposition Plan: home likely tomorrow   Consultants:  [facility]  Procedures:  echo  Antibiotics:    HPI/Subjective: Awake reports symptoms resolved and wants to go home  Objective: Vitals:   08/26/19 1115 08/26/19 1130  BP: (!) 143/69 139/83  Pulse:    Resp: 16 18  Temp:    SpO2:     No intake or output data in the 24 hours ending 08/26/19 1302 Filed Weights   08/25/19 1426  Weight: 86.2 kg    Exam:   General:  Awake alert fit no acute distress  Cardiovascular: rrr no mgr no LE edema  Respiratory: normal effort BS clear bilaterally  Abdomen: non-distended non-tender +BS no guarding or rebounding  Musculoskeletal: joints without swelling/erythema   Data Reviewed: Basic Metabolic Panel: Recent Labs  Lab 08/25/19 1434 08/25/19 1441  NA 138 138  K 3.8 3.7  CL 101 100  CO2 29  --   GLUCOSE 112* 108*  BUN 14 15  CREATININE 0.97 1.00  CALCIUM 9.2  --    Liver Function Tests: Recent Labs  Lab 08/25/19 1434  AST 20  ALT 17  ALKPHOS 70  BILITOT 0.6  PROT 7.3  ALBUMIN 3.7   No results for input(s): LIPASE, AMYLASE in  the last 168 hours. No results for input(s): AMMONIA in the last 168 hours. CBC: Recent Labs  Lab 08/25/19 1434 08/25/19 1441  WBC 13.7*  --   NEUTROABS 10.8*  --   HGB 13.4 13.6  HCT 39.1 40.0  MCV 90.9  --   PLT 202  --    Cardiac Enzymes: No results for input(s): CKTOTAL, CKMB, CKMBINDEX, TROPONINI in the last 168 hours. BNP (last 3 results) No results for input(s): BNP in the last 8760 hours.  ProBNP (last 3 results) No results for input(s): PROBNP in the last 8760 hours.  CBG: Recent Labs  Lab 08/25/19 2020  GLUCAP 136*    Recent Results (from the past 240 hour(s))  SARS Coronavirus 2 [facility] order, Performed in [facility] hospital lab) Nasopharyngeal Nasopharyngeal Swab     Status: None   Collection Time: 08/25/19 10:18 PM   Specimen: Nasopharyngeal Swab  Result Value Ref Range Status   SARS Coronavirus 2 NEGATIVE NEGATIVE Final    Comment: (NOTE) If result is NEGATIVE SARS-CoV-2 target nucleic acids are NOT DETECTED. The SARS-CoV-2 RNA is generally detectable in upper and lower  respiratory specimens during the acute phase of infection. The lowest  concentration of SARS-CoV-2 viral copies this assay can detect is 250  copies / mL. A negative result does not preclude SARS-CoV-2 infection  and should not be used as the sole basis for treatment or other  patient management decisions.  A negative result may occur with  improper specimen collection / handling, submission of specimen other  than nasopharyngeal swab, presence of viral mutation(s) within the  areas targeted by this assay, and inadequate number of viral copies  (<250 copies / mL). A negative result must be combined with clinical  observations, patient history, and epidemiological information. If result is POSITIVE SARS-CoV-2 target nucleic acids are DETECTED. The SARS-CoV-2 RNA is generally detectable in upper and lower  respiratory specimens dur ing the acute phase of infection.  Positive   results are indicative of active infection with SARS-CoV-2.  Clinical  correlation with patient history and other diagnostic information is  necessary to determine patient infection status.  Positive results do  not rule out bacterial infection or co-infection with other viruses. If result is PRESUMPTIVE POSTIVE SARS-CoV-2 nucleic acids MAY BE PRESENT.   A presumptive positive result was obtained on the submitted specimen  and confirmed on repeat testing.  While 2019 novel coronavirus  (SARS-CoV-2) nucleic acids may be present in the submitted sample  additional confirmatory testing may be necessary for epidemiological  and / or clinical management purposes  to differentiate between  SARS-CoV-2 and other Sarbecovirus currently known to infect humans.  If clinically indicated additional testing with an alternate test  methodology [id number]) is advised. The SARS-CoV-2 RNA is generally  detectable in upper and lower respiratory sp ecimens during the acute  phase of infection. The expected result is Negative. Fact Sheet for Patients:  StrictlyIdeas.no Fact Sheet for Healthcare Providers: BankingDealers.co.za This test is not yet approved or cleared by the [place] FDA and has been authorized for detection and/or diagnosis of SARS-CoV-2 by FDA under an Emergency Use Authorization (EUA).  This EUA will remain in effect (meaning this test can be used) for the duration of the COVID-19 declaration under Section 564(b)(1) of the Act, 21 U.S.C. section 360bbb-3(b)(1), unless the authorization is terminated or revoked sooner. Performed at [facility], [address] [place], [place] [address]      Studies: Ct Angio Head W Or Wo Contrast  Result Date: 08/25/2019 CLINICAL DATA:  Initial evaluation for acute altered mental status, aphasia. EXAM: CT ANGIOGRAPHY HEAD AND NECK TECHNIQUE: Multidetector CT imaging of the head and neck was  performed using the standard protocol during bolus administration of intravenous contrast. Multiplanar CT image reconstructions and MIPs were obtained to evaluate the vascular anatomy. Carotid stenosis measurements (when applicable) are obtained utilizing NASCET criteria, using the distal internal carotid diameter as the denominator. CONTRAST:  169mL OMNIPAQUE IOHEXOL 350 MG/ML SOLN COMPARISON:  Prior CT from earlier same day. FINDINGS: CTA NECK FINDINGS Aortic arch: Visualized aortic arch of normal caliber with normal 3 vessel morphology. Mild atheromatous plaque within the distal aspect of the arch. No hemodynamically significant stenosis seen about the origin of the great vessels. Visualized subclavian arteries widely patent. Right carotid system: Mild atheromatous change within the right CCA which is patent from its origin to the bifurcation without stenosis. Multifocal mixed plaque about the proximal right ICA with associated stenosis of up to 65% by NASCET criteria. Right ICA otherwise widely patent distally to the skull base without stenosis, dissection, or occlusion. Left carotid system: Left CCA patent from its origin to the bifurcation without flow-limiting stenosis. Mixed plaque about the proximal left ICA with associated stenosis of up to 55% by NASCET criteria left ICA otherwise widely patent to the skull base without stenosis, dissection or occlusion. Vertebral arteries: Both of the vertebral arteries arise  from the subclavian arteries. Right vertebral artery slightly dominant. Vertebral arteries patent within the neck without stenosis, dissection, or occlusion. Skeleton: No acute osseous abnormality. No discrete lytic or blastic osseous lesions. Other neck: No other acute soft tissue abnormality within the neck. Small amount of layering secretions noted layering within the subglottic trachea. Submandibular glands not visualize, and may be surgically absent. Upper chest: Visualized upper chest  demonstrates no acute finding. Review of the MIP images confirms the above findings CTA HEAD FINDINGS Anterior circulation: Petrous segments widely patent bilaterally. Minor atheromatous change within the cavernous/supraclinoid ICAs without hemodynamically significant stenosis. A1 segments widely patent. Normal anterior communicating artery. Anterior cerebral arteries widely patent to their distal aspects without stenosis. No M1 stenosis or occlusion. Normal MCA bifurcations. Distal MCA branches well perfused and symmetric. Posterior circulation: Vertebral arteries diminutive but patent to the vertebrobasilar junction without stenosis. Posteroinferior cerebral arteries patent bilaterally. Basilar diminutive but patent to its distal aspect without stenosis. Superior cerebral arteries patent proximally. Predominant fetal type origin of the PCAs, both of which are widely patent to their distal aspects without stenosis. Venous sinuses: Grossly patent allowing for timing the contrast bolus. Anatomic variants: Fetal type origin of the PCAs with diminutive vertebrobasilar system. Review of the MIP images confirms the above findings IMPRESSION: 1. Negative CTA for large vessel occlusion. 2. 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 3. No hemodynamically significant or correctable stenosis within the intracranial circulation. 4. Fetal type origin of the PCAs with overall diminutive vertebrobasilar system. Electronically Signed   By: Jeannine Boga M.D.   On: 08/25/2019 23:54   Ct Head Wo Contrast  Result Date: 08/25/2019 CLINICAL DATA:  Aphasia right wrist numbness altered mental status EXAM: CT HEAD WITHOUT CONTRAST TECHNIQUE: Contiguous axial images were obtained from the base of the skull through the vertex without intravenous contrast. COMPARISON:  None. FINDINGS: Brain: No hemorrhage or intracranial mass. Mild  hypodensity in the white matter consistent with small vessel ischemic change. 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 Possible age indeterminate lacunar infarcts in the right basal ganglia and sub insula. Mild atrophy. Slight prominence of the ventricles likely related to slight degree of atrophy. Vascular: No hyperdense vessels. Scattered calcifications at the carotid siphon Skull: Normal. Negative for fracture or focal lesion. Sinuses/Orbits: No acute finding. Other: None IMPRESSION: 1. Tiny hypodensities within the right basal ganglia and sub insula, possible age indeterminate lacunar infarcts. Otherwise no CT evidence for acute intracranial abnormality. 2. 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 Hypodensity in the white matter consistent with small vessel ischemic change. Electronically Signed   By: Donavan Foil M.D.   On: 08/25/2019 15:23   Ct Angio Neck W Or Wo Contrast  Result Date: 08/25/2019 CLINICAL DATA:  Initial evaluation for acute altered mental status, aphasia. EXAM: CT ANGIOGRAPHY HEAD AND NECK TECHNIQUE: Multidetector CT imaging of the head and neck was performed using the standard protocol during bolus administration of intravenous contrast. Multiplanar CT image reconstructions and MIPs were obtained to evaluate the vascular anatomy. Carotid stenosis measurements (when applicable) are obtained utilizing NASCET criteria, using the distal internal carotid diameter as the denominator. CONTRAST:  184mL OMNIPAQUE IOHEXOL 350 MG/ML SOLN COMPARISON:  Prior CT from earlier same day. FINDINGS: CTA NECK FINDINGS Aortic arch: Visualized aortic arch of normal caliber with normal 3 vessel morphology. Mild atheromatous plaque within the distal aspect of the arch. No hemodynamically  significant stenosis seen about the origin of the great vessels. Visualized subclavian arteries widely patent. Right carotid system: Mild atheromatous change within the right CCA which is  patent from its origin to the bifurcation without stenosis. Multifocal mixed plaque about the proximal right ICA with associated stenosis of up to 65% by NASCET criteria. Right ICA otherwise widely patent distally to the skull base without stenosis, dissection, or occlusion. Left carotid system: Left CCA patent from its origin to the bifurcation without flow-limiting stenosis. Mixed plaque about the proximal left ICA with associated stenosis of up to 55% by NASCET criteria left ICA otherwise widely patent to the skull base without stenosis, dissection or occlusion. Vertebral arteries: Both of the vertebral arteries arise from the subclavian arteries. Right vertebral artery slightly dominant. Vertebral arteries patent within the neck without stenosis, dissection, or occlusion. Skeleton: No acute osseous abnormality. No discrete lytic or blastic osseous lesions. Other neck: No other acute soft tissue abnormality within the neck. Small amount of layering secretions noted layering within the subglottic trachea. Submandibular glands not visualize, and may be surgically absent. Upper chest: Visualized upper chest demonstrates no acute finding. Review of the MIP images confirms the above findings CTA HEAD FINDINGS Anterior circulation: Petrous segments widely patent bilaterally. Minor atheromatous change within the cavernous/supraclinoid ICAs without hemodynamically significant stenosis. A1 segments widely patent. Normal anterior communicating artery. Anterior cerebral arteries widely patent to their distal aspects without stenosis. No M1 stenosis or occlusion. Normal MCA bifurcations. Distal MCA branches well perfused and symmetric. Posterior circulation: Vertebral arteries diminutive but patent to the vertebrobasilar junction without stenosis. Posteroinferior cerebral arteries patent bilaterally. Basilar diminutive but patent to its distal aspect without stenosis. Superior cerebral arteries patent proximally. Predominant  fetal type origin of the PCAs, both of which are widely patent to their distal aspects without stenosis. Venous sinuses: Grossly patent allowing for timing the contrast bolus. Anatomic variants: Fetal type origin of the PCAs with diminutive vertebrobasilar system. Review of the MIP images confirms the above findings IMPRESSION: 1. Negative CTA for large vessel occlusion. 2. 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 3. No hemodynamically significant or correctable stenosis within the intracranial circulation. 4. Fetal type origin of the PCAs with overall diminutive vertebrobasilar system. Electronically Signed   By: Jeannine Boga M.D.   On: 08/25/2019 23:54   Mr Brain Wo Contrast  Result Date: 08/26/2019 CLINICAL DATA:  Initial evaluation for focal neural deficit, stroke suspected. EXAM: MRI HEAD WITHOUT CONTRAST TECHNIQUE: Multiplanar, multiecho pulse sequences of the brain and surrounding structures were obtained without intravenous contrast. COMPARISON:  Prior CT and CTA from earlier the same day. FINDINGS: Brain: Diffuse prominence of the CSF containing spaces compatible with generalized age-related cerebral atrophy. Patchy and confluent T2/FLAIR hyperintensity within the periventricular deep white matter both cerebral hemispheres most consistent with chronic small vessel ischemic disease, moderate nature. Superimposed remote lacunar infarcts noted at the caudate heads bilaterally. No abnormal foci of restricted diffusion to suggest acute or subacute ischemia. Gray-white matter differentiation maintained. No encephalomalacia to suggest chronic cortical infarction. No foci of susceptibility artifact to suggest acute or chronic intracranial hemorrhage. No mass lesion, midline shift or mass effect. No hydrocephalus. No extra-axial fluid collection. Pituitary gland within normal limits. Midline structures  intact. 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 Vascular: Major intracranial vascular flow voids are maintained. Skull and upper cervical spine: Craniocervical junction  within normal limits. Bone marrow signal intensity normal. No scalp soft tissue abnormality. Sinuses/Orbits: Patient status post ocular lens replacement on the right. Globes orbital soft tissues demonstrate no acute finding. Paranasal sinuses are largely clear. Trace bilateral mastoid effusions, of doubtful significance. Negative nasopharynx. Inner ear structures normal. Other: None. IMPRESSION: 1. No acute intracranial infarct or other abnormality identified. 2. 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 Electronically Signed   By: Jeannine Boga M.D.   On: 08/26/2019 00:57   Vas US Carotid  Result Date: 08/26/2019 Carotid Arterial Duplex Study Comparison Study:  No previous study available for comparison. Performing Technologist: Toma Copier RVS  Examination Guidelines: A complete evaluation includes B-mode imaging, spectral Doppler, color Doppler, and power Doppler as needed of all accessible portions of each vessel. Bilateral testing is considered an integral part of a complete examination. Limited examinations for reoccurring indications may be performed as noted.  Right Carotid Findings: +----------+--------+--------+--------+--------------------+-------------------+           PSV cm/sEDV cm/sStenosisPlaque Description  Comments            +----------+--------+--------+--------+--------------------+-------------------+ CCA Prox  76      11                                                      +----------+--------+--------+--------+--------------------+-------------------+ CCA Distal84      15                                  mild intimal  wall                                                         changes with mild                                                         plaque              +----------+--------+--------+--------+--------------------+-------------------+ ICA Prox  154     45      40-59%  heterogenous and    velocities are                                        irregular           elevated primarily                                                        in the proximal to  mid region          +----------+--------+--------+--------+--------------------+-------------------+ ICA Mid   146     24      1-39%   heterogenous        mild plaque with                                                          mildly turbulent                                                          flow                +----------+--------+--------+--------+--------------------+-------------------+ ICA Distal77      24                                  mild turbulent flow +----------+--------+--------+--------+--------------------+-------------------+ ECA       154     48              smooth and          mild plaque on the                                    heterogenous        far wall            +----------+--------+--------+--------+--------------------+-------------------+ +----------+--------+-------+--------+-------------------+           PSV cm/sEDV cmsDescribeArm Pressure (mmHG) +----------+--------+-------+--------+-------------------+ [id number]                                         +----------+--------+-------+--------+-------------------+ +---------+--------+--+--------+-+ VertebralPSV cm/s57EDV cm/s9 +---------+--------+--+--------+-+  Left Carotid Findings: +----------+--------+--------+--------+---------------------+------------------+           PSV cm/sEDV cm/sStenosisPlaque Description    Comments           +----------+--------+--------+--------+---------------------+------------------+ CCA Prox  108     15                                   mild intimal wall                                                         changes            +----------+--------+--------+--------+---------------------+------------------+ CCA Distal75      13                                   mild intimal wall  changes            +----------+--------+--------+--------+---------------------+------------------+ ICA Prox  117     29      1-39%   heterogenous and     mild to moderate                                     smooth               plaque noted on                                                           the near and far                                                          wall, however                                                             primarily on the                                                          far wall           +----------+--------+--------+--------+---------------------+------------------+ ICA Mid   90      25                                   mild intimal wall                                                         changes            +----------+--------+--------+--------+---------------------+------------------+ ICA Distal81      24                                                      +----------+--------+--------+--------+---------------------+------------------+ ECA       144     3               heterogenous and     mild plaque in the                                   smooth  far wall, mild                                                            intimal wall                                                              changes             +----------+--------+--------+--------+---------------------+------------------+ +----------+--------+--------+--------+-------------------+           PSV cm/sEDV cm/sDescribeArm Pressure (mmHG) +----------+--------+--------+--------+-------------------+ [id number]                                         +----------+--------+--------+--------+-------------------+ +---------+--------+--+--------+-+ VertebralPSV cm/s62EDV cm/s9 +---------+--------+--+--------+-+  Summary: Right Carotid: 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 Left Carotid: Velocities in the left ICA are consistent with a 1-39% stenosis. Vertebrals:  Bilateral vertebral arteries demonstrate antegrade flow. Subclavians: Normal flow hemodynamics were seen in bilateral subclavian              arteries. *See table(s) above for measurements and observations.     Preliminary     Scheduled Meds: .  stroke: mapping our early stages of recovery book   Does not apply Once  . aspirin EC  81 mg Oral Daily  . atorvastatin  40 mg Oral q1800  . clopidogrel  75 mg Oral Daily  . enoxaparin (LOVENOX) injection  40 mg Subcutaneous Q24H  . sodium chloride flush  3 mL Intravenous Once   Continuous Infusions: . lactated ringers 75 mL/hr at 08/25/19 2251    Principal Problem:   TIA (transient ischemic attack) Active Problems:   Elevated blood pressure reading    Time spent: 45 minutes    Osborne NP [facility] If 7PM-7AM, please contact night-coverage at [email or web address], password [facility] 08/26/2019, 1:02 PM  LOS: 0 days

## 2019-08-26 NOTE — ED Notes (Signed)
ED TO INPATIENT HANDOFF REPORT  ED Nurse Name and Phone #:    S Name/Age/Gender Arthur Guzman 73 y.o. male Room/Bed: 008C/008C  Code Status   Code Status: Full Code  Home/SNF/Other Home Patient oriented to: self, place, time and situation Is this baseline? Yes   Triage Complete: Triage complete  Chief Complaint AMS, speech difficulty, nausea/ vomiting, rt arm numb  Triage Note Pt arrives via EMs from home with altered mental status, aphasia, sudden onset Sunday, headache. Pt alert, able to state name, disoriented to date, place, has expressive and receptive aphasia, wife reports symptoms began Sunday, was c/o right wrist numbness. No droop, drift, unable to assess slurring speech due to inability to repeat phrases.    Allergies Allergies  Allergen Reactions  . Antihistamines, Chlorpheniramine-Type     Antihistamines make me fidgety.    Level of Care/Admitting Diagnosis ED Disposition    ED Disposition Condition Comment   Admit  Hospital Area: [facility] [[id number]]  Level of Care: Telemetry Medical [104]  I expect the patient will be discharged within 24 hours: No (not a candidate for 5C-Observation unit)  Covid Evaluation: Asymptomatic Screening Protocol (No Symptoms)  Diagnosis: TIA (transient ischemic attack) [167614]  Admitting Physician: KAKRAKANDY, ARSHAD N [[id number]]  Attending Physician: KAKRAKANDY, ARSHAD N [[id number]]  PT Class (Do Not Modify): Observation [104]  PT Acc Code (Do Not Modify): Observation [10022]       B Medical/Surgery History History reviewed. No pertinent past medical history. Past Surgical History:  Procedure Laterality Date  . BACK SURGERY     19 95  . PATELLA FRACTURE SURGERY Left      A IV Location/Drains/Wounds Patient Lines/Drains/Airways Status   Active Line/Drains/Airways    Name:   Placement date:   Placement time:   Site:   Days:   Peripheral IV 08/25/19 Right Antecubital   08/25/19    2241     Antecubital   1          Intake/Output Last 24 hours No intake or output data in the 24 hours ending 08/26/19 1250  Labs/Imaging Results for orders placed or performed during the hospital encounter of 08/25/19 (from the past 48 hour(s))  Protime-INR     Status: None   Collection Time: 08/25/19  2:34 PM  Result Value Ref Range   Prothrombin Time 13.2 11.4 - 15.2 seconds   INR 1.0 0.8 - 1.2    Comment: (NOTE) INR goal varies based on device and disease states. Performed at [facility], [place] [address], [place], [place] [address]   APTT     Status: None   Collection Time: 08/25/19  2:34 PM  Result Value Ref Range   aPTT 29 24 - 36 seconds    Comment: Performed at [place] [address], [place], [place] [address]  CBC     Status: Abnormal   Collection Time: 08/25/19  2:34 PM  Result Value Ref Range   WBC 13.7 (H) 4.0 - 10.5 K/uL   RBC 4.30 4.22 - 5.81 MIL/uL   Hemoglobin 13.4 13.0 - 17.0 g/dL   HCT 39.1 39.0 - 52.0 %   MCV 90.9 80.0 - 100.0 fL   MCH 31.2 26.0 - 34.0 pg   MCHC 34.3 30.0 - 36.0 g/dL   RDW 13.2 11.5 - 15.5 %   Platelets 202 150 - 400 K/uL   nRBC 0.0 0.0 - 0.2 %    Comment: Performed at [facility], [address] [place], [place] [address]  Differential     Status: Abnormal   Collection Time: 08/25/19  2:34 PM  Result Value Ref Range   Neutrophils Relative % 79 %   Neutro Abs 10.8 (H) 1.7 - 7.7 K/uL   Lymphocytes Relative 14 %   Lymphs Abs 1.9 0.7 - 4.0 K/uL   Monocytes Relative 7 %   Monocytes Absolute 1.0 0.1 - 1.0 K/uL   Eosinophils Relative 0 %   Eosinophils Absolute 0.0 0.0 - 0.5 K/uL   Basophils Relative 0 %   Basophils Absolute 0.0 0.0 - 0.1 K/uL   Immature Granulocytes 0 %   Abs Immature Granulocytes 0.04 0.00 - 0.07 K/uL    Comment: Performed at [facility] [address], [place], [place] [address]  Comprehensive metabolic panel     Status: Abnormal   Collection Time: 08/25/19  2:34 PM  Result Value Ref  Range   Sodium 138 135 - 145 mmol/L   Potassium 3.8 3.5 - 5.1 mmol/L   Chloride 101 98 - 111 mmol/L   CO2 29 22 - 32 mmol/L   Glucose, Bld 112 (H) 70 - 99 mg/dL   BUN 14 8 - 23 mg/dL   Creatinine, Ser 0.97 0.61 - 1.24 mg/dL   Calcium 9.2 8.9 - 10.3 mg/dL   Total Protein 7.3 6.5 - 8.1 g/dL   Albumin 3.7 3.5 - 5.0 g/dL   AST 20 15 - 41 U/L   ALT 17 0 - 44 U/L   Alkaline Phosphatase 70 38 - 126 U/L   Total Bilirubin 0.6 0.3 - 1.2 mg/dL   GFR calc non Af Amer >60 >60 mL/min   GFR calc Af Amer >60 >60 mL/min   Anion gap 8 5 - 15    Comment: Performed at [facility] [address], [place], [place] [address]  I-stat chem 8, ED     Status: Abnormal   Collection Time: 08/25/19  2:41 PM  Result Value Ref Range   Sodium 138 135 - 145 mmol/L   Potassium 3.7 3.5 - 5.1 mmol/L   Chloride 100 98 - 111 mmol/L   BUN 15 8 - 23 mg/dL   Creatinine, Ser 1.00 0.61 - 1.24 mg/dL   Glucose, Bld 108 (H) 70 - 99 mg/dL   Calcium, Ion 1.16 1.15 - 1.40 mmol/L   TCO2 26 22 - 32 mmol/L   Hemoglobin 13.6 13.0 - 17.0 g/dL   HCT 40.0 39.0 - 52.0 %  CBG monitoring, ED     Status: Abnormal   Collection Time: 08/25/19  8:20 PM  Result Value Ref Range   Glucose-Capillary 136 (H) 70 - 99 mg/dL  SARS Coronavirus 2 [facility] order, Performed in [facility] hospital lab) Nasopharyngeal Nasopharyngeal Swab     Status: None   Collection Time: 08/25/19 10:18 PM   Specimen: Nasopharyngeal Swab  Result Value Ref Range   SARS Coronavirus 2 NEGATIVE NEGATIVE    Comment: (NOTE) If result is NEGATIVE SARS-CoV-2 target nucleic acids are NOT DETECTED. The SARS-CoV-2 RNA is generally detectable in upper and lower  respiratory specimens during the acute phase of infection. The lowest  concentration of SARS-CoV-2 viral copies this assay can detect is 250  copies / mL. A negative result does not preclude SARS-CoV-2 infection  and should not be used as the sole basis for treatment or other  patient management  decisions.  A negative result may occur with  improper specimen  collection / handling, submission of specimen other  than nasopharyngeal swab, presence of viral mutation(s) within the  areas targeted by this assay, and inadequate number of viral copies  (<250 copies / mL). A negative result must be combined with clinical  observations, patient history, and epidemiological information. If result is POSITIVE SARS-CoV-2 target nucleic acids are DETECTED. The SARS-CoV-2 RNA is generally detectable in upper and lower  respiratory specimens dur ing the acute phase of infection.  Positive  results are indicative of active infection with SARS-CoV-2.  Clinical  correlation with patient history and other diagnostic information is  necessary to determine patient infection status.  Positive results do  not rule out bacterial infection or co-infection with other viruses. If result is PRESUMPTIVE POSTIVE SARS-CoV-2 nucleic acids MAY BE PRESENT.   A presumptive positive result was obtained on the submitted specimen  and confirmed on repeat testing.  While 2019 novel coronavirus  (SARS-CoV-2) nucleic acids may be present in the submitted sample  additional confirmatory testing may be necessary for epidemiological  and / or clinical management purposes  to differentiate between  SARS-CoV-2 and other Sarbecovirus currently known to infect humans.  If clinically indicated additional testing with an alternate test  methodology [phone]) is advised. The SARS-CoV-2 RNA is generally  detectable in upper and lower respiratory sp ecimens during the acute  phase of infection. The expected result is Negative. Fact Sheet for Patients:  StrictlyIdeas.no Fact Sheet for Healthcare Providers: BankingDealers.co.za This test is not yet approved or cleared by the [place] FDA and has been authorized for detection and/or diagnosis of SARS-CoV-2 by FDA under an  Emergency Use Authorization (EUA).  This EUA will remain in effect (meaning this test can be used) for the duration of the COVID-19 declaration under Section 564(b)(1) of the Act, 21 U.S.C. section 360bbb-3(b)(1), unless the authorization is terminated or revoked sooner. Performed at [facility], [place] [address] [place], [place] [address]    Ct Angio Head W Or Wo Contrast  Result Date: 08/25/2019 CLINICAL DATA:  Initial evaluation for acute altered mental status, aphasia. EXAM: CT ANGIOGRAPHY HEAD AND NECK TECHNIQUE: Multidetector CT imaging of the head and neck was performed using the standard protocol during bolus administration of intravenous contrast. Multiplanar CT image reconstructions and MIPs were obtained to evaluate the vascular anatomy. Carotid stenosis measurements (when applicable) are obtained utilizing NASCET criteria, using the distal internal carotid diameter as the denominator. CONTRAST:  149mL OMNIPAQUE IOHEXOL 350 MG/ML SOLN COMPARISON:  Prior CT from earlier same day. FINDINGS: CTA NECK FINDINGS Aortic arch: Visualized aortic arch of normal caliber with normal 3 vessel morphology. Mild atheromatous plaque within the distal aspect of the arch. No hemodynamically significant stenosis seen about the origin of the great vessels. Visualized subclavian arteries widely patent. Right carotid system: Mild atheromatous change within the right CCA which is patent from its origin to the bifurcation without stenosis. Multifocal mixed plaque about the proximal right ICA with associated stenosis of up to 65% by NASCET criteria. Right ICA otherwise widely patent distally to the skull base without stenosis, dissection, or occlusion. Left carotid system: Left CCA patent from its origin to the bifurcation without flow-limiting stenosis. Mixed plaque about the proximal left ICA with associated stenosis of up to 55% by NASCET criteria left ICA otherwise widely patent to the skull base without  stenosis, dissection or occlusion. Vertebral arteries: Both of the vertebral arteries arise from the subclavian arteries. Right vertebral artery slightly dominant. Vertebral arteries patent  within the neck without stenosis, dissection, or occlusion. Skeleton: No acute osseous abnormality. No discrete lytic or blastic osseous lesions. Other neck: No other acute soft tissue abnormality within the neck. Small amount of layering secretions noted layering within the subglottic trachea. Submandibular glands not visualize, and may be surgically absent. Upper chest: Visualized upper chest demonstrates no acute finding. Review of the MIP images confirms the above findings CTA HEAD FINDINGS Anterior circulation: Petrous segments widely patent bilaterally. Minor atheromatous change within the cavernous/supraclinoid ICAs without hemodynamically significant stenosis. A1 segments widely patent. Normal anterior communicating artery. Anterior cerebral arteries widely patent to their distal aspects without stenosis. No M1 stenosis or occlusion. Normal MCA bifurcations. Distal MCA branches well perfused and symmetric. Posterior circulation: Vertebral arteries diminutive but patent to the vertebrobasilar junction without stenosis. Posteroinferior cerebral arteries patent bilaterally. Basilar diminutive but patent to its distal aspect without stenosis. Superior cerebral arteries patent proximally. Predominant fetal type origin of the PCAs, both of which are widely patent to their distal aspects without stenosis. Venous sinuses: Grossly patent allowing for timing the contrast bolus. Anatomic variants: Fetal type origin of the PCAs with diminutive vertebrobasilar system. Review of the MIP images confirms the above findings IMPRESSION: 1. Negative CTA for large vessel occlusion. 2. 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 3. No  hemodynamically significant or correctable stenosis within the intracranial circulation. 4. Fetal type origin of the PCAs with overall diminutive vertebrobasilar system. Electronically Signed   By: Jeannine Boga M.D.   On: 08/25/2019 23:54   Ct Head Wo Contrast  Result Date: 08/25/2019 CLINICAL DATA:  Aphasia right wrist numbness altered mental status EXAM: CT HEAD WITHOUT CONTRAST TECHNIQUE: Contiguous axial images were obtained from the base of the skull through the vertex without intravenous contrast. COMPARISON:  None. FINDINGS: Brain: No hemorrhage or intracranial mass. Mild hypodensity in the white matter consistent with small vessel ischemic change. 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 Possible age indeterminate lacunar infarcts in the right basal ganglia and sub insula. Mild atrophy. Slight prominence of the ventricles likely related to slight degree of atrophy. Vascular: No hyperdense vessels. Scattered calcifications at the carotid siphon Skull: Normal. Negative for fracture or focal lesion. Sinuses/Orbits: No acute finding. Other: None IMPRESSION: 1. Tiny hypodensities within the right basal ganglia and sub insula, possible age indeterminate lacunar infarcts. Otherwise no CT evidence for acute intracranial abnormality. 2. 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 Hypodensity in the white matter consistent with small vessel ischemic change. Electronically Signed   By: Donavan Foil M.D.   On: 08/25/2019 15:23   Ct Angio Neck W Or Wo Contrast  Result Date: 08/25/2019 CLINICAL DATA:  Initial evaluation for acute altered mental status, aphasia. EXAM: CT ANGIOGRAPHY HEAD AND NECK TECHNIQUE: Multidetector CT imaging of the head and neck was performed using the standard protocol during bolus administration of intravenous contrast. Multiplanar CT image reconstructions and MIPs were obtained to evaluate the vascular anatomy. Carotid stenosis measurements (when  applicable) are obtained utilizing NASCET criteria, using the distal internal carotid diameter as the denominator. CONTRAST:  142mL OMNIPAQUE IOHEXOL 350 MG/ML SOLN COMPARISON:  Prior CT from earlier same day. FINDINGS: CTA NECK FINDINGS Aortic arch: Visualized aortic arch of normal caliber with normal 3 vessel morphology. Mild atheromatous plaque within the distal aspect of the arch. No hemodynamically significant stenosis seen about the origin of the great vessels. Visualized subclavian  arteries widely patent. Right carotid system: Mild atheromatous change within the right CCA which is patent from its origin to the bifurcation without stenosis. Multifocal mixed plaque about the proximal right ICA with associated stenosis of up to 65% by NASCET criteria. Right ICA otherwise widely patent distally to the skull base without stenosis, dissection, or occlusion. Left carotid system: Left CCA patent from its origin to the bifurcation without flow-limiting stenosis. Mixed plaque about the proximal left ICA with associated stenosis of up to 55% by NASCET criteria left ICA otherwise widely patent to the skull base without stenosis, dissection or occlusion. Vertebral arteries: Both of the vertebral arteries arise from the subclavian arteries. Right vertebral artery slightly dominant. Vertebral arteries patent within the neck without stenosis, dissection, or occlusion. Skeleton: No acute osseous abnormality. No discrete lytic or blastic osseous lesions. Other neck: No other acute soft tissue abnormality within the neck. Small amount of layering secretions noted layering within the subglottic trachea. Submandibular glands not visualize, and may be surgically absent. Upper chest: Visualized upper chest demonstrates no acute finding. Review of the MIP images confirms the above findings CTA HEAD FINDINGS Anterior circulation: Petrous segments widely patent bilaterally. Minor atheromatous change within the cavernous/supraclinoid  ICAs without hemodynamically significant stenosis. A1 segments widely patent. Normal anterior communicating artery. Anterior cerebral arteries widely patent to their distal aspects without stenosis. No M1 stenosis or occlusion. Normal MCA bifurcations. Distal MCA branches well perfused and symmetric. Posterior circulation: Vertebral arteries diminutive but patent to the vertebrobasilar junction without stenosis. Posteroinferior cerebral arteries patent bilaterally. Basilar diminutive but patent to its distal aspect without stenosis. Superior cerebral arteries patent proximally. Predominant fetal type origin of the PCAs, both of which are widely patent to their distal aspects without stenosis. Venous sinuses: Grossly patent allowing for timing the contrast bolus. Anatomic variants: Fetal type origin of the PCAs with diminutive vertebrobasilar system. Review of the MIP images confirms the above findings IMPRESSION: 1. Negative CTA for large vessel occlusion. 2. 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 3. No hemodynamically significant or correctable stenosis within the intracranial circulation. 4. Fetal type origin of the PCAs with overall diminutive vertebrobasilar system. Electronically Signed   By: Jeannine Boga M.D.   On: 08/25/2019 23:54   Mr Brain Wo Contrast  Result Date: 08/26/2019 CLINICAL DATA:  Initial evaluation for focal neural deficit, stroke suspected. EXAM: MRI HEAD WITHOUT CONTRAST TECHNIQUE: Multiplanar, multiecho pulse sequences of the brain and surrounding structures were obtained without intravenous contrast. COMPARISON:  Prior CT and CTA from earlier the same day. FINDINGS: Brain: Diffuse prominence of the CSF containing spaces compatible with generalized age-related cerebral atrophy. Patchy and confluent T2/FLAIR hyperintensity within the periventricular deep white matter both cerebral  hemispheres most consistent with chronic small vessel ischemic disease, moderate nature. Superimposed remote lacunar infarcts noted at the caudate heads bilaterally. No abnormal foci of restricted diffusion to suggest acute or subacute ischemia. Gray-white matter differentiation maintained. No encephalomalacia to suggest chronic cortical infarction. No foci of susceptibility artifact to suggest acute or chronic intracranial hemorrhage. No mass lesion, midline shift or mass effect. No hydrocephalus. No extra-axial fluid collection. Pituitary gland within normal limits. Midline structures intact. 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 Vascular: Major intracranial vascular flow voids are maintained. Skull and upper cervical spine: Craniocervical junction within normal limits. Bone marrow signal intensity normal. No scalp soft tissue abnormality.  Sinuses/Orbits: Patient status post ocular lens replacement on the right. Globes orbital soft tissues demonstrate no acute finding. Paranasal sinuses are largely clear. Trace bilateral mastoid effusions, of doubtful significance. Negative nasopharynx. Inner ear structures normal. Other: None. IMPRESSION: 1. No acute intracranial infarct or other abnormality identified. 2. 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 Electronically Signed   By: Jeannine Boga M.D.   On: 08/26/2019 00:57   Vas US Carotid  Result Date: 08/26/2019 Carotid Arterial Duplex Study Comparison Study:  No previous study available for comparison. Performing Technologist: Toma Copier RVS  Examination Guidelines: A complete evaluation includes B-mode imaging, spectral Doppler, color Doppler, and power Doppler as needed of all accessible portions of each vessel. Bilateral testing is considered an integral part of a complete  examination. Limited examinations for reoccurring indications may be performed as noted.  Right Carotid Findings: +----------+--------+--------+--------+--------------------+-------------------+           PSV cm/sEDV cm/sStenosisPlaque Description  Comments            +----------+--------+--------+--------+--------------------+-------------------+ CCA Prox  76      11                                                      +----------+--------+--------+--------+--------------------+-------------------+ CCA Distal84      15                                  mild intimal wall                                                         changes with mild                                                         plaque              +----------+--------+--------+--------+--------------------+-------------------+ ICA Prox  154     45      40-59%  heterogenous and    velocities are                                        irregular           elevated primarily                                                        in the proximal to  mid region          +----------+--------+--------+--------+--------------------+-------------------+ ICA Mid   146     24      1-39%   heterogenous        mild plaque with                                                          mildly turbulent                                                          flow                +----------+--------+--------+--------+--------------------+-------------------+ ICA Distal77      24                                  mild turbulent flow +----------+--------+--------+--------+--------------------+-------------------+ ECA       154     48              smooth and          mild plaque on the                                    heterogenous        far wall             +----------+--------+--------+--------+--------------------+-------------------+ +----------+--------+-------+--------+-------------------+           PSV cm/sEDV cmsDescribeArm Pressure (mmHG) +----------+--------+-------+--------+-------------------+ [id number]                                         +----------+--------+-------+--------+-------------------+ +---------+--------+--+--------+-+ VertebralPSV cm/s57EDV cm/s9 +---------+--------+--+--------+-+  Left Carotid Findings: +----------+--------+--------+--------+---------------------+------------------+           PSV cm/sEDV cm/sStenosisPlaque Description   Comments           +----------+--------+--------+--------+---------------------+------------------+ CCA Prox  108     15                                   mild intimal wall                                                         changes            +----------+--------+--------+--------+---------------------+------------------+ CCA Distal75      13                                   mild intimal wall  changes            +----------+--------+--------+--------+---------------------+------------------+ ICA Prox  117     29      1-39%   heterogenous and     mild to moderate                                     smooth               plaque noted on                                                           the near and far                                                          wall, however                                                             primarily on the                                                          far wall           +----------+--------+--------+--------+---------------------+------------------+ ICA Mid   90      25                                   mild intimal wall                                                          changes            +----------+--------+--------+--------+---------------------+------------------+ ICA Distal81      24                                                      +----------+--------+--------+--------+---------------------+------------------+ ECA       144     3               heterogenous and     mild plaque in the                                   smooth  far wall, mild                                                            intimal wall                                                              changes            +----------+--------+--------+--------+---------------------+------------------+ +----------+--------+--------+--------+-------------------+           PSV cm/sEDV cm/sDescribeArm Pressure (mmHG) +----------+--------+--------+--------+-------------------+ [id number]                                         +----------+--------+--------+--------+-------------------+ +---------+--------+--+--------+-+ VertebralPSV cm/s62EDV cm/s9 +---------+--------+--+--------+-+  Summary: Right Carotid: 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 Left Carotid: Velocities in the left ICA are consistent with a 1-39% stenosis. Vertebrals:  Bilateral vertebral arteries demonstrate antegrade flow. Subclavians: Normal flow hemodynamics were seen in bilateral subclavian              arteries. *See table(s) above for measurements and observations.     Preliminary     Pending Labs Unresulted Labs (From admission, onward)    Start     Ordered   09/01/19 0500  Creatinine, serum  (enoxaparin (LOVENOX)    CrCl >/= 30 ml/min)  Weekly,   R    Comments: while on enoxaparin therapy    08/26/19 1123   08/26/19 1124  CBC  (enoxaparin (LOVENOX)    CrCl >/= 30 ml/min)  Once,   STAT    Comments: Baseline for enoxaparin therapy IF NOT ALREADY DRAWN.  Notify MD if PLT < 100 K.    08/26/19 1123   08/26/19 1124   Creatinine, serum  (enoxaparin (LOVENOX)    CrCl >/= 30 ml/min)  Once,   STAT    Comments: Baseline for enoxaparin therapy IF NOT ALREADY DRAWN.    08/26/19 1123   08/26/19 0914  Hemoglobin A1c  Tomorrow morning,   R     08/26/19 0913   08/26/19 0914  Lipid panel  Tomorrow morning,   R    Comments: Fasting    08/26/19 0913   08/26/19 0914  Comprehensive metabolic panel  Tomorrow morning,   R     08/26/19 0913   08/26/19 0914  CBC  Tomorrow morning,   R     08/26/19 0913          Vitals/Pain Today's Vitals   08/26/19 1045 08/26/19 1100 08/26/19 1115 08/26/19 1130  BP: (!) 145/75 (!) 159/73 (!) 143/69 139/83  Pulse:      Resp: 19 18 16 18   Temp:      TempSrc:      SpO2:      Weight:      Height:      PainSc:        Isolation Precautions No active isolations  Medications Medications  sodium chloride flush (NS) 0.9 % injection 3 mL (3 mLs Intravenous Not Given 08/25/19 2235)  lactated ringers infusion ( Intravenous New Bag/Given 08/25/19 2251)   stroke: mapping our early stages of recovery book (has no administration in time range)  acetaminophen (TYLENOL) tablet 650 mg (has no administration in time range)    Or  acetaminophen (TYLENOL) solution 650 mg (has no administration in time range)    Or  acetaminophen (TYLENOL) suppository 650 mg (has no administration in time range)  enoxaparin (LOVENOX) injection 40 mg (40 mg Subcutaneous Not Given 08/26/19 1126)  aspirin EC tablet 81 mg (81 mg Oral Given 08/26/19 0928)  clopidogrel (PLAVIX) tablet 75 mg (75 mg Oral Given 08/26/19 0928)  atorvastatin (LIPITOR) tablet 40 mg (has no administration in time range)  acetaminophen (TYLENOL) tablet 650 mg (650 mg Oral Given 08/25/19 2228)  iohexol (OMNIPAQUE) 350 MG/ML injection 75 mL (100 mLs Intravenous Contrast Given 08/25/19 2317)    Mobility walks Low fall risk   Focused Assessments Neuro Assessment Handoff:  Swallow screen pass? Yes  Cardiac Rhythm: Normal sinus rhythm NIH  Stroke Scale ( + Modified Stroke Scale Criteria)  Interval: Initial Level of Consciousness (1a.)   : Alert, keenly responsive LOC Questions (1b. )   +: Answers one question correctly LOC Commands (1c. )   + : Performs both tasks correctly Best Gaze (2. )  +: Normal Visual (3. )  +: No visual loss Facial Palsy (4. )    : Normal symmetrical movements Motor Arm, Left (5a. )   +: No drift Motor Arm, Right (5b. )   +: No drift Motor Leg, Left (6a. )   +: No drift Motor Leg, Right (6b. )   +: No drift Limb Ataxia (7. ): Absent Sensory (8. )   +: Normal, no sensory loss Best Language (9. )   +: No aphasia Dysarthria (10. ): Normal Extinction/Inattention (11.)   +: No Abnormality Modified SS Total  +: 1 Complete NIHSS TOTAL: 0 Last date known well: 08/23/19   Neuro Assessment: Exceptions to WDL Neuro Checks:   Initial (08/25/19 2016)  Last Documented NIHSS Modified Score: 1 (08/26/19 0935) Has TPA been given? No If patient is a Neuro Trauma and patient is going to OR before floor call report to [place] nurse: [phone] or [phone]     R Recommendations: See Admitting Provider Note  Report given to:   Additional Notes:

## 2019-08-26 NOTE — ED Notes (Signed)
Tele Ordered bfast 

## 2019-08-26 NOTE — ED Notes (Signed)
Lunch Tray Ordered @ 1016. 

## 2019-08-27 ENCOUNTER — Other Ambulatory Visit: Payer: Self-pay | Admitting: Neurology

## 2019-08-27 DIAGNOSIS — G43109 Migraine with aura, not intractable, without status migrainosus: Secondary | ICD-10-CM

## 2019-08-27 DIAGNOSIS — G459 Transient cerebral ischemic attack, unspecified: Secondary | ICD-10-CM | POA: Diagnosis not present

## 2019-08-27 DIAGNOSIS — D72829 Elevated white blood cell count, unspecified: Secondary | ICD-10-CM | POA: Diagnosis not present

## 2019-08-27 DIAGNOSIS — G43909 Migraine, unspecified, not intractable, without status migrainosus: Secondary | ICD-10-CM | POA: Diagnosis present

## 2019-08-27 LAB — COMPREHENSIVE METABOLIC PANEL
ALT: 15 U/L (ref 0–44)
AST: 21 U/L (ref 15–41)
Albumin: 3.1 g/dL — ABNORMAL LOW (ref 3.5–5.0)
Alkaline Phosphatase: 76 U/L (ref 38–126)
Anion gap: 9 (ref 5–15)
BUN: 13 mg/dL (ref 8–23)
CO2: 25 mmol/L (ref 22–32)
Calcium: 8.7 mg/dL — ABNORMAL LOW (ref 8.9–10.3)
Chloride: 105 mmol/L (ref 98–111)
Creatinine, Ser: 1.03 mg/dL (ref 0.61–1.24)
GFR calc Af Amer: >60 mL/min (ref >60)
GFR calc non Af Amer: >60 mL/min (ref >60)
Glucose, Bld: 93 mg/dL (ref 70–99)
Potassium: 3.2 mmol/L — ABNORMAL LOW (ref 3.5–5.1)
Sodium: 139 mmol/L (ref 135–145)
Total Bilirubin: 0.7 mg/dL (ref 0.3–1.2)
Total Protein: 6.5 g/dL (ref 6.5–8.1)

## 2019-08-27 LAB — CBC
HCT: 36.5 % — ABNORMAL LOW (ref 39.0–52.0)
Hemoglobin: 12.9 g/dL — ABNORMAL LOW (ref 13.0–17.0)
MCH: 31.5 pg (ref 26.0–34.0)
MCHC: 35.3 g/dL (ref 30.0–36.0)
MCV: 89.2 fL (ref 80.0–100.0)
Platelets: 188 10*3/uL (ref 150–400)
RBC: 4.09 MIL/uL — ABNORMAL LOW (ref 4.22–5.81)
RDW: 13.1 % (ref 11.5–15.5)
WBC: 7.5 10*3/uL (ref 4.0–10.5)
nRBC: 0 % (ref 0.0–0.2)

## 2019-08-27 LAB — LIPID PANEL
Cholesterol: 169 mg/dL (ref 0–200)
HDL: 31 mg/dL — ABNORMAL LOW (ref >40)
LDL Cholesterol: 101 mg/dL — ABNORMAL HIGH (ref 0–99)
Total CHOL/HDL Ratio: 5.5 RATIO
Triglycerides: 183 mg/dL — ABNORMAL HIGH (ref <150)
VLDL: 37 mg/dL (ref 0–40)

## 2019-08-27 MED ORDER — ASPIRIN 81 MG PO TBEC: 81.0000 mg | DELAYED_RELEASE_TABLET | Freq: Every day | ORAL | Status: DC

## 2019-08-27 MED ORDER — ATORVASTATIN CALCIUM 40 MG PO TABS: 40.0000 mg | ORAL_TABLET | Freq: Every day | ORAL | 1 refills | Status: DC

## 2019-08-27 MED ORDER — DIVALPROEX SODIUM 500 MG PO DR TAB: 500.0000 mg | DELAYED_RELEASE_TABLET | Freq: Every day | ORAL | 1 refills | Status: DC

## 2019-08-27 MED ORDER — CLOPIDOGREL BISULFATE 75 MG PO TABS: 75.0000 mg | ORAL_TABLET | Freq: Every day | ORAL | 0 refills | Status: AC

## 2019-08-27 NOTE — Care Management Obs Status (Signed)
MEDICARE OBSERVATION STATUS NOTIFICATION   Patient Details  Name: Arthur Guzman MRN: [id number] Date of Birth: December 09, 1946   Medicare Observation Status Notification Given:  Yes    Pollie Friar, RN 08/27/2019, 10:21 AM

## 2019-08-27 NOTE — Discharge Instructions (Signed)
LDL: 101 Triglycerides: 183

## 2019-08-27 NOTE — Progress Notes (Signed)
Occupational Therapy Treatment Patient Details Name: Arthur Guzman MRN: [id number] DOB: 01-01-1946 Today's Date: 08/27/2019    History of present illness Pt is a 73 y/o male admitted with TIA symptoms including altered mental status, aphasia, sudden onset headache. 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   OT comments  Pt making great progress towards OT goals this session. Pt up with wife at sink completing oral care with no AD. Supervision for safety for dynamic balance tasks. Pt ambulate to bathroom with supervision and completed hygiene MOD I with use of rails. Discussed home set-up with wife. Wife reports home has been newly renovated with grab bars and raised toilets. [name]. Will continue to follow for acute OT needs.   Follow Up Recommendations  No OT follow up;Supervision/Assistance - 24 hour    Equipment Recommendations  None recommended by OT    Recommendations for Other Services      Precautions / Restrictions Precautions Precautions: Fall Restrictions Weight Bearing Restrictions: No       Mobility Bed Mobility               General bed mobility comments: up at sink upon arrival  Transfers Overall transfer level: Needs assistance Equipment used: None Transfers: Stand Pivot Transfers   Stand pivot transfers: Supervision       General transfer comment: pt ambulate to bathroom from sink with no AD; supervision for safety    Balance Overall balance assessment: Needs assistance Sitting-balance support: Feet supported Sitting balance-Leahy Scale: Good     Standing balance support: No upper extremity supported;During functional activity Standing balance-Leahy Scale: Fair Standing balance comment: supervision for balance standing at sink; no LOB noted  this session                           ADL either performed or assessed with clinical judgement   ADL Overall ADL's : Needs assistance/impaired     Grooming: Standing;Supervision/safety;Oral care Grooming Details (indicate cue type and reason): pt standing at sink upon OT arrival with no AD with wife close by                 Toilet Transfer: Supervision/safety;Regular Toilet;Ambulation Toilet Transfer Details (indicate cue type and reason): pt ambulate to bathroom for toileting with supervision for safety Toileting- Clothing Manipulation and Hygiene: Modified independent;Sit to/from stand Toileting - Clothing Manipulation Details (indicate cue type and reason): pt complete hygiene MOD I using hand rails     Functional mobility during ADLs: Supervision/safety General ADL Comments: supervision for safety this session; better balance this session during ADLs     Vision       Perception     Praxis      Cognition Arousal/Alertness: Awake/alert Behavior During Therapy: WFL for tasks assessed/performed Overall Cognitive Status: Within Functional Limits for tasks assessed Area of Impairment: Problem solving                             Problem Solving: Slow processing;Requires verbal cues;Requires tactile cues General Comments: pt WFL for session, slow to process but overall pleasant, increased time and verbal/ tactile cues        Exercises     Shoulder Instructions       General Comments spouse present  and supportive throughout session    Pertinent Vitals/ Pain       Pain Assessment: No/denies pain  Home Living                                          Prior Functioning/Environment              Frequency  Min 2X/week        Progress Toward Goals  OT Goals(current goals can now be found in the care plan section)  Progress towards OT goals: Progressing toward goals  Acute Rehab OT Goals Time For Goal  Achievement: 09/09/19 Potential to Achieve Goals: Good  Plan      Co-evaluation                 AM-PAC OT "6 Clicks" Daily Activity     Outcome Measure   Help from another person eating meals?: None Help from another person taking care of personal grooming?: None Help from another person toileting, which includes using toliet, bedpan, or urinal?: None Help from another person bathing (including washing, rinsing, drying)?: None Help from another person to put on and taking off regular upper body clothing?: None Help from another person to put on and taking off regular lower body clothing?: A Little 6 Click Score: 23    End of Session    OT Visit Diagnosis: Other symptoms and signs involving the nervous system (R29.898);Unsteadiness on feet (R26.81)   Activity Tolerance Patient tolerated treatment well   Patient Left with family/visitor present;Other (comment)(on toilet with RN aware)   Nurse Communication Mobility status        Time: [phone] OT Time Calculation (min): 13 min  Charges: OT General Charges $OT Visit: 1 Visit OT Treatments $Self Care/Home Management : 8-22 mins  Rachel, [place] [facility] [phone] [place] 08/27/2019, 9:23 AM

## 2019-08-27 NOTE — Progress Notes (Signed)
NURSING PROGRESS NOTE  HAVARD KAMEL [id number] Discharge Data: 08/27/2019 10:55 AM Attending Provider: Geradine Girt, DO [id number], Jama Flavors, MD     Aida Raider to be D/C'd Home per MD order.  Discussed with the patient the After Visit Summary and all questions fully answered. All IV's discontinued with no bleeding noted. All belongings returned to patient for patient to take home.   Last Vital Signs:  Blood pressure 138/69, pulse (!) 59, temperature 97.9 F (36.6 C), temperature source Oral, resp. rate 18, height 5\' 10"  (1.778 m), weight 86.2 kg, SpO2 98 %.  Discharge Medication List Allergies as of 08/27/2019      Reactions   Antihistamines, Chlorpheniramine-type    Antihistamines make me fidgety.      Medication List    STOP taking these medications   HYDROcodone-acetaminophen 10-325 MG tablet Commonly known as: NORCO   HYDROcodone-acetaminophen 5-325 MG tablet Commonly known as: NORCO/VICODIN   ondansetron 4 MG disintegrating tablet Commonly known as: Zofran ODT     TAKE these medications   acetaminophen 500 MG tablet Commonly known as: TYLENOL Take 500 mg by mouth every 6 (six) hours as needed for mild pain or headache.   aspirin 81 MG EC tablet Take 1 tablet (81 mg total) by mouth daily. Notes to patient: 08/27/2019   atorvastatin 40 MG tablet Commonly known as: LIPITOR Take 1 tablet (40 mg total) by mouth daily at 6 PM. Notes to patient: 08/27/2019   clopidogrel 75 MG tablet Commonly known as: PLAVIX Take 1 tablet (75 mg total) by mouth daily for 20 days. Notes to patient: 08/27/2019   divalproex 500 MG DR tablet Commonly known as: DEPAKOTE Take 1 tablet (500 mg total) by mouth at bedtime. Notes to patient: 08/27/2019

## 2019-08-27 NOTE — Progress Notes (Signed)
STROKE TEAM PROGRESS NOTE   INTERVAL HISTORY Pt neuro stable. No HA. Eager to go home.    Vitals:   08/26/19 2052 08/26/19 2348 08/27/19 0356 08/27/19 0803  BP: (!) 142/73 (!) 155/68 128/60 138/69  Pulse: 62 66 (!) 55 (!) 59  Resp: 18 16 17 18   Temp: 97.9 F (36.6 C) 97.8 F (36.6 C) 97.6 F (36.4 C) 97.9 F (36.6 C)  TempSrc: Oral Oral Oral Oral  SpO2: 96% 100% 96% 98%  Weight:      Height:        CBC:  Recent Labs  Lab 08/25/19 1434 08/25/19 1441 08/27/19 0327  WBC 13.7*  --  7.5  NEUTROABS 10.8*  --   --   HGB 13.4 13.6 12.9*  HCT 39.1 40.0 36.5*  MCV 90.9  --  89.2  PLT 202  --  0000000    Basic Metabolic Panel:  Recent Labs  Lab 08/25/19 1434 08/25/19 1441 08/27/19 0327  NA 138 138 139  K 3.8 3.7 3.2*  CL 101 100 105  CO2 29  --  25  GLUCOSE 112* 108* 93  BUN 14 15 13   CREATININE 0.97 1.00 1.03  CALCIUM 9.2  --  8.7*   Lipid Panel:     Component Value Date/Time   CHOL 169 08/27/2019 0327   TRIG 183 (H) 08/27/2019 0327   HDL 31 (L) 08/27/2019 0327   CHOLHDL 5.5 08/27/2019 0327   VLDL 37 08/27/2019 0327   LDLCALC 101 (H) 08/27/2019 0327   HgbA1c: No results found for: HGBA1C Urine Drug Screen: No results found for: LABOPIA, COCAINSCRNUR, LABBENZ, AMPHETMU, THCU, LABBARB  Alcohol Level No results found for: ETH  IMAGING Ct Angio Head W Or Wo Contrast  Result Date: 08/25/2019 CLINICAL DATA:  Initial evaluation for acute altered mental status, aphasia. EXAM: CT ANGIOGRAPHY HEAD AND NECK TECHNIQUE: Multidetector CT imaging of the head and neck was performed using the standard protocol during bolus administration of intravenous contrast. Multiplanar CT image reconstructions and MIPs were obtained to evaluate the vascular anatomy. Carotid stenosis measurements (when applicable) are obtained utilizing NASCET criteria, using the distal internal carotid diameter as the denominator. CONTRAST:  18mL OMNIPAQUE IOHEXOL 350 MG/ML SOLN COMPARISON:  Prior CT from  earlier same day. FINDINGS: CTA NECK FINDINGS Aortic arch: Visualized aortic arch of normal caliber with normal 3 vessel morphology. Mild atheromatous plaque within the distal aspect of the arch. No hemodynamically significant stenosis seen about the origin of the great vessels. Visualized subclavian arteries widely patent. Right carotid system: Mild atheromatous change within the right CCA which is patent from its origin to the bifurcation without stenosis. Multifocal mixed plaque about the proximal right ICA with associated stenosis of up to 65% by NASCET criteria. Right ICA otherwise widely patent distally to the skull base without stenosis, dissection, or occlusion. Left carotid system: Left CCA patent from its origin to the bifurcation without flow-limiting stenosis. Mixed plaque about the proximal left ICA with associated stenosis of up to 55% by NASCET criteria left ICA otherwise widely patent to the skull base without stenosis, dissection or occlusion. Vertebral arteries: Both of the vertebral arteries arise from the subclavian arteries. Right vertebral artery slightly dominant. Vertebral arteries patent within the neck without stenosis, dissection, or occlusion. Skeleton: No acute osseous abnormality. No discrete lytic or blastic osseous lesions. Other neck: No other acute soft tissue abnormality within the neck. Small amount of layering secretions noted layering within the subglottic trachea. Submandibular glands not visualize,  and may be surgically absent. Upper chest: Visualized upper chest demonstrates no acute finding. Review of the MIP images confirms the above findings CTA HEAD FINDINGS Anterior circulation: Petrous segments widely patent bilaterally. Minor atheromatous change within the cavernous/supraclinoid ICAs without hemodynamically significant stenosis. A1 segments widely patent. Normal anterior communicating artery. Anterior cerebral arteries widely patent to their distal aspects without  stenosis. No M1 stenosis or occlusion. Normal MCA bifurcations. Distal MCA branches well perfused and symmetric. Posterior circulation: Vertebral arteries diminutive but patent to the vertebrobasilar junction without stenosis. Posteroinferior cerebral arteries patent bilaterally. Basilar diminutive but patent to its distal aspect without stenosis. Superior cerebral arteries patent proximally. Predominant fetal type origin of the PCAs, both of which are widely patent to their distal aspects without stenosis. Venous sinuses: Grossly patent allowing for timing the contrast bolus. Anatomic variants: Fetal type origin of the PCAs with diminutive vertebrobasilar system. Review of the MIP images confirms the above findings IMPRESSION: 1. Negative CTA for large vessel occlusion. 2. 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 3. No hemodynamically significant or correctable stenosis within the intracranial circulation. 4. Fetal type origin of the PCAs with overall diminutive vertebrobasilar system. Electronically Signed   By: Jeannine Boga M.D.   On: 08/25/2019 23:54   Ct Angio Neck W Or Wo Contrast  Result Date: 08/25/2019 CLINICAL DATA:  Initial evaluation for acute altered mental status, aphasia. EXAM: CT ANGIOGRAPHY HEAD AND NECK TECHNIQUE: Multidetector CT imaging of the head and neck was performed using the standard protocol during bolus administration of intravenous contrast. Multiplanar CT image reconstructions and MIPs were obtained to evaluate the vascular anatomy. Carotid stenosis measurements (when applicable) are obtained utilizing NASCET criteria, using the distal internal carotid diameter as the denominator. CONTRAST:  149mL OMNIPAQUE IOHEXOL 350 MG/ML SOLN COMPARISON:  Prior CT from earlier same day. FINDINGS: CTA NECK FINDINGS Aortic arch: Visualized aortic arch of normal caliber with normal 3 vessel  morphology. Mild atheromatous plaque within the distal aspect of the arch. No hemodynamically significant stenosis seen about the origin of the great vessels. Visualized subclavian arteries widely patent. Right carotid system: Mild atheromatous change within the right CCA which is patent from its origin to the bifurcation without stenosis. Multifocal mixed plaque about the proximal right ICA with associated stenosis of up to 65% by NASCET criteria. Right ICA otherwise widely patent distally to the skull base without stenosis, dissection, or occlusion. Left carotid system: Left CCA patent from its origin to the bifurcation without flow-limiting stenosis. Mixed plaque about the proximal left ICA with associated stenosis of up to 55% by NASCET criteria left ICA otherwise widely patent to the skull base without stenosis, dissection or occlusion. Vertebral arteries: Both of the vertebral arteries arise from the subclavian arteries. Right vertebral artery slightly dominant. Vertebral arteries patent within the neck without stenosis, dissection, or occlusion. Skeleton: No acute osseous abnormality. No discrete lytic or blastic osseous lesions. Other neck: No other acute soft tissue abnormality within the neck. Small amount of layering secretions noted layering within the subglottic trachea. Submandibular glands not visualize, and may be surgically absent. Upper chest: Visualized upper chest demonstrates no acute finding. Review of the MIP images confirms the above findings CTA HEAD FINDINGS Anterior circulation: Petrous segments widely patent bilaterally. Minor atheromatous change within the cavernous/supraclinoid ICAs without hemodynamically significant stenosis. A1 segments widely patent. Normal anterior communicating artery. Anterior cerebral arteries widely patent to their  distal aspects without stenosis. No M1 stenosis or occlusion. Normal MCA bifurcations. Distal MCA branches well perfused and symmetric. Posterior  circulation: Vertebral arteries diminutive but patent to the vertebrobasilar junction without stenosis. Posteroinferior cerebral arteries patent bilaterally. Basilar diminutive but patent to its distal aspect without stenosis. Superior cerebral arteries patent proximally. Predominant fetal type origin of the PCAs, both of which are widely patent to their distal aspects without stenosis. Venous sinuses: Grossly patent allowing for timing the contrast bolus. Anatomic variants: Fetal type origin of the PCAs with diminutive vertebrobasilar system. Review of the MIP images confirms the above findings IMPRESSION: 1. Negative CTA for large vessel occlusion. 2. 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 3. No hemodynamically significant or correctable stenosis within the intracranial circulation. 4. Fetal type origin of the PCAs with overall diminutive vertebrobasilar system. Electronically Signed   By: Jeannine Boga M.D.   On: 08/25/2019 23:54   Mr Brain Wo Contrast  Result Date: 08/26/2019 CLINICAL DATA:  Initial evaluation for focal neural deficit, stroke suspected. EXAM: MRI HEAD WITHOUT CONTRAST TECHNIQUE: Multiplanar, multiecho pulse sequences of the brain and surrounding structures were obtained without intravenous contrast. COMPARISON:  Prior CT and CTA from earlier the same day. FINDINGS: Brain: Diffuse prominence of the CSF containing spaces compatible with generalized age-related cerebral atrophy. Patchy and confluent T2/FLAIR hyperintensity within the periventricular deep white matter both cerebral hemispheres most consistent with chronic small vessel ischemic disease, moderate nature. Superimposed remote lacunar infarcts noted at the caudate heads bilaterally. No abnormal foci of restricted diffusion to suggest acute or subacute ischemia. Gray-white matter differentiation maintained. No encephalomalacia  to suggest chronic cortical infarction. No foci of susceptibility artifact to suggest acute or chronic intracranial hemorrhage. No mass lesion, midline shift or mass effect. No hydrocephalus. No extra-axial fluid collection. Pituitary gland within normal limits. Midline structures intact. 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 Vascular: Major intracranial vascular flow voids are maintained. Skull and upper cervical spine: Craniocervical junction within normal limits. Bone marrow signal intensity normal. No scalp soft tissue abnormality. Sinuses/Orbits: Patient status post ocular lens replacement on the right. Globes orbital soft tissues demonstrate no acute finding. Paranasal sinuses are largely clear. Trace bilateral mastoid effusions, of doubtful significance. Negative nasopharynx. Inner ear structures normal. Other: None. IMPRESSION: 1. No acute intracranial infarct or other abnormality identified. 2. 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 Electronically Signed   By: Jeannine Boga M.D.   On: 08/26/2019 00:57   Vas US Carotid  Result Date: 08/27/2019 Carotid Arterial Duplex Study Comparison Study:  No previous study available for comparison. Performing Technologist: Toma Copier RVS  Examination Guidelines: A complete evaluation includes B-mode imaging, spectral Doppler, color Doppler, and power Doppler as needed of all accessible portions of each vessel. Bilateral testing is considered an integral part of a complete examination. Limited examinations for reoccurring indications may be performed as noted.  Right Carotid Findings: +----------+--------+--------+--------+--------------------+-------------------+           PSV cm/sEDV cm/sStenosisPlaque Description  Comments             +----------+--------+--------+--------+--------------------+-------------------+ CCA Prox  76      11                                                      +----------+--------+--------+--------+--------------------+-------------------+  CCA Distal84      15                                  mild intimal wall                                                         changes with mild                                                         plaque              +----------+--------+--------+--------+--------------------+-------------------+ ICA Prox  154     45      40-59%  heterogenous and    velocities are                                        irregular           elevated primarily                                                        in the proximal to                                                        mid region          +----------+--------+--------+--------+--------------------+-------------------+ ICA Mid   146     24      1-39%   heterogenous        mild plaque with                                                          mildly turbulent                                                          flow                +----------+--------+--------+--------+--------------------+-------------------+ ICA Distal77      24                                  mild turbulent flow +----------+--------+--------+--------+--------------------+-------------------+ ECA       154     48  smooth and          mild plaque on the                                    heterogenous        far wall            +----------+--------+--------+--------+--------------------+-------------------+ +----------+--------+-------+--------+-------------------+           PSV cm/sEDV cmsDescribeArm Pressure (mmHG) +----------+--------+-------+--------+-------------------+ [id number]                                          +----------+--------+-------+--------+-------------------+ +---------+--------+--+--------+-+ VertebralPSV cm/s57EDV cm/s9 +---------+--------+--+--------+-+  Left Carotid Findings: +----------+--------+--------+--------+---------------------+------------------+           PSV cm/sEDV cm/sStenosisPlaque Description   Comments           +----------+--------+--------+--------+---------------------+------------------+ CCA Prox  108     15                                   mild intimal wall                                                         changes            +----------+--------+--------+--------+---------------------+------------------+ CCA Distal75      13                                   mild intimal wall                                                         changes            +----------+--------+--------+--------+---------------------+------------------+ ICA Prox  117     29      1-39%   heterogenous and     mild to moderate                                     smooth               plaque noted on                                                           the near and far                                                          wall, however  primarily on the                                                          far wall           +----------+--------+--------+--------+---------------------+------------------+ ICA Mid   90      25                                   mild intimal wall                                                         changes            +----------+--------+--------+--------+---------------------+------------------+ ICA Distal81      24                                                      +----------+--------+--------+--------+---------------------+------------------+ ECA       144     3               heterogenous and      mild plaque in the                                   smooth               far wall, mild                                                            intimal wall                                                              changes            +----------+--------+--------+--------+---------------------+------------------+ +----------+--------+--------+--------+-------------------+           PSV cm/sEDV cm/sDescribeArm Pressure (mmHG) +----------+--------+--------+--------+-------------------+ [id number]                                         +----------+--------+--------+--------+-------------------+ +---------+--------+--+--------+-+ VertebralPSV cm/s62EDV cm/s9 +---------+--------+--+--------+-+  Summary: Right Carotid: 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 Left Carotid: Velocities in the left ICA are consistent with a 1-39% stenosis. Vertebrals:  Bilateral vertebral arteries demonstrate antegrade flow. Subclavians: Normal flow hemodynamics were seen in bilateral  subclavian              arteries. *See table(s) above for measurements and observations.  Electronically signed by Antony Contras MD on 08/27/2019 at 8:25:36 AM.    Final     PHYSICAL EXAM    Temp:  [97.6 F (36.4 C)-98.2 F (36.8 C)] 97.9 F (36.6 C) (09/10 0803) Pulse Rate:  [55-66] 59 (09/10 0803) Resp:  [16-18] 18 (09/10 0803) BP: (128-155)/(60-73) 138/69 (09/10 0803) SpO2:  [96 %-100 %] 98 % (09/10 0803)  General - Well nourished, well developed, in no apparent distress.  Ophthalmologic - fundi not visualized due to noncooperation.  Cardiovascular - Regular rate and rhythm.  Mental Status -  Level of arousal and orientation to time, place, and person were intact. Language including expression, naming, repetition, comprehension was assessed and found intact. Fund of Knowledge was assessed and was intact.  Cranial Nerves II - XII - II - Visual  field intact OU. III, IV, VI - Extraocular movements intact. V - Facial sensation intact bilaterally. VII - facial symmetrical.  VIII - Hard of hearing & vestibular intact bilaterally. X - Palate elevates symmetrically. XI - Chin turning & shoulder shrug intact bilaterally. XII - Tongue protrusion intact.  Motor Strength - The patient's strength was normal in all extremities and pronator drift was absent.  Bulk was normal and fasciculations were absent.   Motor Tone - Muscle tone was assessed at the neck and appendages and was normal.  Reflexes - The patient's reflexes were symmetrical in all extremities and he had no pathological reflexes.  Sensory - Light touch, temperature/pinprick were assessed and were symmetrical.    Coordination - The patient had normal movements in the hands with no ataxia or dysmetria.  Tremor was absent.  Gait and Station - deferred.   ASSESSMENT/PLAN [name] is a 73 y.o. male with no significant PMH presenting with AMS, aphasia and HA, R wrist numbness.   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 Chronic caudate head lacunes. White matter small vessel disease.  CTA head & neck no LVO. 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 Fetal origin PCAs w. diminuative VB system.  MRI  No acute infarct. Old B caudate head lacunes.   Carotid Doppler  R 40-59% stenosis  2D Echo EF 60-65%. No source of embolus   LDL 101  HgbA1c pending   Lovenox 40 mg sq daily for VTE prophylaxis  No antithrombotic prior to admission, now on aspirin 81 mg daily, aspirin 325 mg daily and clopidogrel 75 mg daily. Continue DAPT at d/c x 3 weeks then aspirin alone    Therapy recommendations:  No PT, No OT   Disposition:  Return home   Migraine   Patient has a history of migraine at a young age, resolved in his 88s  The current 3 episodes all associate with headache, symptoms resolved after headache  resolves.  Concerning for complicated migraine  Start on Depakote 500 mg nightly  Follow-up with neurology clinic.  Elevated BP  Home meds:  none  BP 130-160s today . BP goal normotensive . Consider antihypertensive if BP does not normalize  Hyperlipidemia  Home meds:  No statin  Started on Lipitor 40 from the ED  LDL 101, goal < 70  Continue statin at discharge  Other Stroke Risk Factors  Advanced age  Other Active Problems  Leukocytosis, WBC 13.7, afebrile  Hospital day # 0  Neurology will sign off. Please call with questions. Pt will follow up with stroke clinic Dr. Leonie Man at [facility] in about 4 weeks. Thanks for the consult.   Rosalin Hawking, MD PhD Stroke Neurology 08/27/2019 3:44 PM    To contact Stroke Continuity provider, please refer to [email or web address] After hours, contact General Neurology

## 2019-08-27 NOTE — Discharge Summary (Addendum)
Physician Discharge Summary  Arthur Guzman [id number] DOB: 07-28-1946 DOA: 08/25/2019  PCP: Arthur Sacramento, MD  Admit date: 08/25/2019 Discharge date: 08/27/2019  Time spent: 45 minutes  Recommendations for Outpatient Follow-up:   1. Follow up with neuro 4-5 weeks  2. Follow up with PCP 1-2 weeks for BP monitoring and evaluation of symptoms- check FLP and LFTs 3. Consider repeat carotid dopplar on right in 1 year 4. HgbA1c pending 5. ASA/plavix x 3 weeks then ASA alone   Discharge Diagnoses:  Principal Problem:   TIA (transient ischemic attack) Active Problems:   Migraine   Elevated blood pressure reading   Discharge Condition: stable  Diet recommendation: heart healthy  Filed Weights   08/25/19 1426  Weight: 86.2 kg    History of present illness:   Arthur Guzman is a 73 y.o. male with no significant past medical history presented to the ER 9/8 because of difficulty understanding words and headache.  On August 22 2021  in the evening patient had frontal headache following which patient had an episode of nausea vomiting and had some difficulty understanding words and patient at the time went to sleep.  The following day patient had no symptoms.  Then again this morning around 12 PM patient started having frontal headaches and difficulty understanding think at this time patient's wife also noticed some right-sided weakness with decreased gripping strength.  This lasted for few hours.  Patient presented to the ER.  Also had numbness of the right upper extremity.  On July 05, 2019 patient presented to the ER with similar symptoms with a headache. Hospital Course:   1. TIAvs complicated migraine-patient symptoms are concerning for TIA. Symptoms resolved. CT angiogram of the head and neck did not show any large vessel obstruction. MRI brain is negative for acute infarct. 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 Evaluated by neuro who opined complicated migraine vs L brain TIAs.  recommended DAPT for 3 weeks then asa alone.  will need recheck R carotid one year 2. Elevated blood pressure readings- fair control. No hx of same. at discharge SBP range 130's. OP follow up with PCP 3. Leukocytosis patient is presently afebrile. Urinalysis clear 4. Migraine. Pt with hx migraine at young age and in his 11's. Evaluated by neuro who opined current 3 episodes all associate with headache, symptoms resolved after headache resolves thus concerning for migraine. [place]ote started and will follow up with neuro clinic    Procedures:  echo  Consultations:  Arthur Guzman neurology  Discharge Exam: Vitals:   08/26/19 2348 08/27/19 0356  BP: (!) 155/68 128/60  Pulse: 66 (!) 55  Resp: 16 17  Temp: 97.8 F (36.6 C) 97.6 F (36.4 C)  SpO2: 100% 96%    General: awake alert oriented x3  Cardiovascular: rrr no mgr no LE edema Respiratory: normal effort BS clear bilaterally no wheeze Neuro: oriented x3 speech clear MAE  Discharge Instructions    Allergies as of 08/27/2019      Reactions   Antihistamines, Chlorpheniramine-type    Antihistamines make me fidgety.      Medication List    STOP taking these medications   HYDROcodone-acetaminophen 10-325 MG tablet Commonly known as: NORCO   HYDROcodone-acetaminophen 5-325 MG tablet Commonly known as: NORCO/VICODIN   ondansetron 4 MG disintegrating tablet Commonly known as: Zofran ODT  TAKE these medications   acetaminophen 500 MG tablet Commonly known as: TYLENOL Take 500 mg by mouth every 6 (six) hours as needed for mild pain or headache.   aspirin 81 MG EC tablet Take 1 tablet (81 mg total) by mouth daily.   atorvastatin 40 MG tablet Commonly known as: LIPITOR Take 1 tablet (40 mg total) by mouth daily at 6 PM.    clopidogrel 75 MG tablet Commonly known as: PLAVIX Take 1 tablet (75 mg total) by mouth daily for 20 days.   divalproex 500 MG DR tablet Commonly known as: DEPAKOTE Take 1 tablet (500 mg total) by mouth at bedtime.      Allergies  Allergen Reactions  . Antihistamines, Chlorpheniramine-Type     Antihistamines make me fidgety.      The results of significant diagnostics from this hospitalization (including imaging, microbiology, ancillary and laboratory) are listed below for reference.    Significant Diagnostic Studies: Ct Angio Head W Or Wo Contrast  Result Date: 08/25/2019 CLINICAL DATA:  Initial evaluation for acute altered mental status, aphasia. EXAM: CT ANGIOGRAPHY HEAD AND NECK TECHNIQUE: Multidetector CT imaging of the head and neck was performed using the standard protocol during bolus administration of intravenous contrast. Multiplanar CT image reconstructions and MIPs were obtained to evaluate the vascular anatomy. Carotid stenosis measurements (when applicable) are obtained utilizing NASCET criteria, using the distal internal carotid diameter as the denominator. CONTRAST:  135mL OMNIPAQUE IOHEXOL 350 MG/ML SOLN COMPARISON:  Prior CT from earlier same day. FINDINGS: CTA NECK FINDINGS Aortic arch: Visualized aortic arch of normal caliber with normal 3 vessel morphology. Mild atheromatous plaque within the distal aspect of the arch. No hemodynamically significant stenosis seen about the origin of the great vessels. Visualized subclavian arteries widely patent. Right carotid system: Mild atheromatous change within the right CCA which is patent from its origin to the bifurcation without stenosis. Multifocal mixed plaque about the proximal right ICA with associated stenosis of up to 65% by NASCET criteria. Right ICA otherwise widely patent distally to the skull base without stenosis, dissection, or occlusion. Left carotid system: Left CCA patent from its origin to the bifurcation without  flow-limiting stenosis. Mixed plaque about the proximal left ICA with associated stenosis of up to 55% by NASCET criteria left ICA otherwise widely patent to the skull base without stenosis, dissection or occlusion. Vertebral arteries: Both of the vertebral arteries arise from the subclavian arteries. Right vertebral artery slightly dominant. Vertebral arteries patent within the neck without stenosis, dissection, or occlusion. Skeleton: No acute osseous abnormality. No discrete lytic or blastic osseous lesions. Other neck: No other acute soft tissue abnormality within the neck. Small amount of layering secretions noted layering within the subglottic trachea. Submandibular glands not visualize, and may be surgically absent. Upper chest: Visualized upper chest demonstrates no acute finding. Review of the MIP images confirms the above findings CTA HEAD FINDINGS Anterior circulation: Petrous segments widely patent bilaterally. Minor atheromatous change within the cavernous/supraclinoid ICAs without hemodynamically significant stenosis. A1 segments widely patent. Normal anterior communicating artery. Anterior cerebral arteries widely patent to their distal aspects without stenosis. No M1 stenosis or occlusion. Normal MCA bifurcations. Distal MCA branches well perfused and symmetric. Posterior circulation: Vertebral arteries diminutive but patent to the vertebrobasilar junction without stenosis. Posteroinferior cerebral arteries patent bilaterally. Basilar diminutive but patent to its distal aspect without stenosis. Superior cerebral arteries patent proximally. Predominant fetal type origin of the PCAs, both of which are widely patent to their distal aspects without stenosis.  Venous sinuses: Grossly patent allowing for timing the contrast bolus. Anatomic variants: Fetal type origin of the PCAs with diminutive vertebrobasilar system. Review of the MIP images confirms the above findings IMPRESSION: 1. Negative CTA for large  vessel occlusion. 2. 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 3. No hemodynamically significant or correctable stenosis within the intracranial circulation. 4. Fetal type origin of the PCAs with overall diminutive vertebrobasilar system. Electronically Signed   By: Jeannine Boga M.D.   On: 08/25/2019 23:54   Ct Head Wo Contrast  Result Date: 08/25/2019 CLINICAL DATA:  Aphasia right wrist numbness altered mental status EXAM: CT HEAD WITHOUT CONTRAST TECHNIQUE: Contiguous axial images were obtained from the base of the skull through the vertex without intravenous contrast. COMPARISON:  None. FINDINGS: Brain: No hemorrhage or intracranial mass. Mild hypodensity in the white matter consistent with small vessel ischemic change. 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 Possible age indeterminate lacunar infarcts in the right basal ganglia and sub insula. Mild atrophy. Slight prominence of the ventricles likely related to slight degree of atrophy. Vascular: No hyperdense vessels. Scattered calcifications at the carotid siphon Skull: Normal. Negative for fracture or focal lesion. Sinuses/Orbits: No acute finding. Other: None IMPRESSION: 1. Tiny hypodensities within the right basal ganglia and sub insula, possible age indeterminate lacunar infarcts. Otherwise no CT evidence for acute intracranial abnormality. 2. 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 Hypodensity in the white matter consistent with small vessel ischemic change. Electronically Signed   By: Donavan Foil M.D.   On: 08/25/2019 15:23   Ct Angio Neck W Or Wo Contrast  Result Date: 08/25/2019 CLINICAL DATA:  Initial evaluation for acute altered mental status, aphasia. EXAM: CT ANGIOGRAPHY HEAD AND NECK TECHNIQUE: Multidetector CT imaging of the head and neck was performed using the standard protocol during bolus  administration of intravenous contrast. Multiplanar CT image reconstructions and MIPs were obtained to evaluate the vascular anatomy. Carotid stenosis measurements (when applicable) are obtained utilizing NASCET criteria, using the distal internal carotid diameter as the denominator. CONTRAST:  126mL OMNIPAQUE IOHEXOL 350 MG/ML SOLN COMPARISON:  Prior CT from earlier same day. FINDINGS: CTA NECK FINDINGS Aortic arch: Visualized aortic arch of normal caliber with normal 3 vessel morphology. Mild atheromatous plaque within the distal aspect of the arch. No hemodynamically significant stenosis seen about the origin of the great vessels. Visualized subclavian arteries widely patent. Right carotid system: Mild atheromatous change within the right CCA which is patent from its origin to the bifurcation without stenosis. Multifocal mixed plaque about the proximal right ICA with associated stenosis of up to 65% by NASCET criteria. Right ICA otherwise widely patent distally to the skull base without stenosis, dissection, or occlusion. Left carotid system: Left CCA patent from its origin to the bifurcation without flow-limiting stenosis. Mixed plaque about the proximal left ICA with associated stenosis of up to 55% by NASCET criteria left ICA otherwise widely patent to the skull base without stenosis, dissection or occlusion. Vertebral arteries: Both of the vertebral arteries arise from the subclavian arteries. Right vertebral artery slightly dominant. Vertebral arteries patent within the neck without stenosis, dissection, or occlusion. Skeleton: No acute osseous abnormality. No discrete lytic or blastic osseous lesions. Other neck: No other acute soft tissue abnormality within the neck. Small amount of layering secretions noted layering within the subglottic trachea. Submandibular glands not visualize, and may be surgically absent. Upper chest:  Visualized upper chest demonstrates no acute finding. Review of the MIP images  confirms the above findings CTA HEAD FINDINGS Anterior circulation: Petrous segments widely patent bilaterally. Minor atheromatous change within the cavernous/supraclinoid ICAs without hemodynamically significant stenosis. A1 segments widely patent. Normal anterior communicating artery. Anterior cerebral arteries widely patent to their distal aspects without stenosis. No M1 stenosis or occlusion. Normal MCA bifurcations. Distal MCA branches well perfused and symmetric. Posterior circulation: Vertebral arteries diminutive but patent to the vertebrobasilar junction without stenosis. Posteroinferior cerebral arteries patent bilaterally. Basilar diminutive but patent to its distal aspect without stenosis. Superior cerebral arteries patent proximally. Predominant fetal type origin of the PCAs, both of which are widely patent to their distal aspects without stenosis. Venous sinuses: Grossly patent allowing for timing the contrast bolus. Anatomic variants: Fetal type origin of the PCAs with diminutive vertebrobasilar system. Review of the MIP images confirms the above findings IMPRESSION: 1. Negative CTA for large vessel occlusion. 2. 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 3. No hemodynamically significant or correctable stenosis within the intracranial circulation. 4. Fetal type origin of the PCAs with overall diminutive vertebrobasilar system. Electronically Signed   By: Jeannine Boga M.D.   On: 08/25/2019 23:54   Mr Brain Wo Contrast  Result Date: 08/26/2019 CLINICAL DATA:  Initial evaluation for focal neural deficit, stroke suspected. EXAM: MRI HEAD WITHOUT CONTRAST TECHNIQUE: Multiplanar, multiecho pulse sequences of the brain and surrounding structures were obtained without intravenous contrast. COMPARISON:  Prior CT and CTA from earlier the same day. FINDINGS: Brain: Diffuse prominence of the CSF  containing spaces compatible with generalized age-related cerebral atrophy. Patchy and confluent T2/FLAIR hyperintensity within the periventricular deep white matter both cerebral hemispheres most consistent with chronic small vessel ischemic disease, moderate nature. Superimposed remote lacunar infarcts noted at the caudate heads bilaterally. No abnormal foci of restricted diffusion to suggest acute or subacute ischemia. Gray-white matter differentiation maintained. No encephalomalacia to suggest chronic cortical infarction. No foci of susceptibility artifact to suggest acute or chronic intracranial hemorrhage. No mass lesion, midline shift or mass effect. No hydrocephalus. No extra-axial fluid collection. Pituitary gland within normal limits. Midline structures intact. 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 Vascular: Major intracranial vascular flow voids are maintained. Skull and upper cervical spine: Craniocervical junction within normal limits. Bone marrow signal intensity normal. No scalp soft tissue abnormality. Sinuses/Orbits: Patient status post ocular lens replacement on the right. Globes orbital soft tissues demonstrate no acute finding. Paranasal sinuses are largely clear. Trace bilateral mastoid effusions, of doubtful significance. Negative nasopharynx. Inner ear structures normal. Other: None. IMPRESSION: 1. No acute intracranial infarct or other abnormality identified. 2. 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 Electronically Signed   By: Jeannine Boga M.D.   On: 08/26/2019 00:57   Vas US Carotid  Result Date: 08/26/2019 Carotid Arterial Duplex Study Comparison Study:  No previous study available for comparison. Performing Technologist: Toma Copier RVS  Examination Guidelines: A complete evaluation includes  B-mode imaging, spectral Doppler, color Doppler, and power Doppler as needed of all accessible portions of each vessel. Bilateral testing is considered an integral part of a complete examination. Limited examinations for reoccurring indications may be performed as noted.  Right Carotid Findings: +----------+--------+--------+--------+--------------------+-------------------+           PSV cm/sEDV cm/sStenosisPlaque Description  Comments            +----------+--------+--------+--------+--------------------+-------------------+ CCA Prox  76      11                                                      +----------+--------+--------+--------+--------------------+-------------------+ CCA Distal84      15                                  mild intimal wall                                                         changes with mild                                                         plaque              +----------+--------+--------+--------+--------------------+-------------------+ ICA Prox  154     45      40-59%  heterogenous and    velocities are                                        irregular           elevated primarily                                                        in the proximal to                                                        mid region          +----------+--------+--------+--------+--------------------+-------------------+ ICA Mid   146     24      1-39%   heterogenous        mild plaque with                                                          mildly turbulent                                                          flow                +----------+--------+--------+--------+--------------------+-------------------+  ICA Distal77      24                                  mild turbulent flow +----------+--------+--------+--------+--------------------+-------------------+ ECA       154     48               smooth and          mild plaque on the                                    heterogenous        far wall            +----------+--------+--------+--------+--------------------+-------------------+ +----------+--------+-------+--------+-------------------+           PSV cm/sEDV cmsDescribeArm Pressure (mmHG) +----------+--------+-------+--------+-------------------+ [id number]                                         +----------+--------+-------+--------+-------------------+ +---------+--------+--+--------+-+ VertebralPSV cm/s57EDV cm/s9 +---------+--------+--+--------+-+  Left Carotid Findings: +----------+--------+--------+--------+---------------------+------------------+           PSV cm/sEDV cm/sStenosisPlaque Description   Comments           +----------+--------+--------+--------+---------------------+------------------+ CCA Prox  108     15                                   mild intimal wall                                                         changes            +----------+--------+--------+--------+---------------------+------------------+ CCA Distal75      13                                   mild intimal wall                                                         changes            +----------+--------+--------+--------+---------------------+------------------+ ICA Prox  117     29      1-39%   heterogenous and     mild to moderate                                     smooth               plaque noted on  the near and far                                                          wall, however                                                             primarily on the                                                          far wall           +----------+--------+--------+--------+---------------------+------------------+ ICA Mid   90       25                                   mild intimal wall                                                         changes            +----------+--------+--------+--------+---------------------+------------------+ ICA Distal81      24                                                      +----------+--------+--------+--------+---------------------+------------------+ ECA       144     3               heterogenous and     mild plaque in the                                   smooth               far wall, mild                                                            intimal wall                                                              changes            +----------+--------+--------+--------+---------------------+------------------+ +----------+--------+--------+--------+-------------------+  PSV cm/sEDV cm/sDescribeArm Pressure (mmHG) +----------+--------+--------+--------+-------------------+ [id number]                                         +----------+--------+--------+--------+-------------------+ +---------+--------+--+--------+-+ VertebralPSV cm/s62EDV cm/s9 +---------+--------+--+--------+-+  Summary: Right Carotid: 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 Left Carotid: Velocities in the left ICA are consistent with a 1-39% stenosis. Vertebrals:  Bilateral vertebral arteries demonstrate antegrade flow. Subclavians: Normal flow hemodynamics were seen in bilateral subclavian              arteries. *See table(s) above for measurements and observations.     Preliminary     Microbiology: Recent Results (from the past 240 hour(s))  SARS Coronavirus 2 [facility] order, Performed in [facility] hospital lab) Nasopharyngeal Nasopharyngeal Swab     Status: None   Collection Time: 08/25/19 10:18 PM   Specimen: Nasopharyngeal Swab  Result Value Ref Range Status   SARS Coronavirus 2 NEGATIVE  NEGATIVE Final    Comment: (NOTE) If result is NEGATIVE SARS-CoV-2 target nucleic acids are NOT DETECTED. The SARS-CoV-2 RNA is generally detectable in upper and lower  respiratory specimens during the acute phase of infection. The lowest  concentration of SARS-CoV-2 viral copies this assay can detect is 250  copies / mL. A negative result does not preclude SARS-CoV-2 infection  and should not be used as the sole basis for treatment or other  patient management decisions.  A negative result may occur with  improper specimen collection / handling, submission of specimen other  than nasopharyngeal swab, presence of viral mutation(s) within the  areas targeted by this assay, and inadequate number of viral copies  (<250 copies / mL). A negative result must be combined with clinical  observations, patient history, and epidemiological information. If result is POSITIVE SARS-CoV-2 target nucleic acids are DETECTED. The SARS-CoV-2 RNA is generally detectable in upper and lower  respiratory specimens dur ing the acute phase of infection.  Positive  results are indicative of active infection with SARS-CoV-2.  Clinical  correlation with patient history and other diagnostic information is  necessary to determine patient infection status.  Positive results Guzman  not rule out bacterial infection or co-infection with other viruses. If result is PRESUMPTIVE POSTIVE SARS-CoV-2 nucleic acids MAY BE PRESENT.   A presumptive positive result was obtained on the submitted specimen  and confirmed on repeat testing.  While 2019 novel coronavirus  (SARS-CoV-2) nucleic acids may be present in the submitted sample  additional confirmatory testing may be necessary for epidemiological  and / or clinical management purposes  to differentiate between  SARS-CoV-2 and other Sarbecovirus currently known to infect humans.  If clinically indicated additional testing with an alternate test  methodology [phone]) is  advised. The SARS-CoV-2 RNA is generally  detectable in upper and lower respiratory sp ecimens during the acute  phase of infection. The expected result is Negative. Fact Sheet for Patients:  StrictlyIdeas.no Fact Sheet for Healthcare Providers: BankingDealers.co.za This test is not yet approved or cleared by the [place] FDA and has been authorized for detection and/or diagnosis of SARS-CoV-2 by FDA under an Emergency Use Authorization (EUA).  This EUA will remain in effect (meaning this test can be used) for the duration of the COVID-19 declaration under Section 564(b)(1) of the Act, 21 U.S.C.  section 360bbb-3(b)(1), unless the authorization is terminated or revoked sooner. Performed at [facility], [place] [address], [place], [place] [address]      Labs: Basic Metabolic Panel: Recent Labs  Lab 08/25/19 1434 08/25/19 1441 08/27/19 0327  NA 138 138 139  K 3.8 3.7 3.2*  CL 101 100 105  CO2 29  --  25  GLUCOSE 112* 108* 93  BUN 14 15 13   CREATININE 0.97 1.00 1.03  CALCIUM 9.2  --  8.7*   Liver Function Tests: Recent Labs  Lab 08/25/19 1434 08/27/19 0327  AST 20 21  ALT 17 15  ALKPHOS 70 76  BILITOT 0.6 0.7  PROT 7.3 6.5  ALBUMIN 3.7 3.1*   No results for input(s): LIPASE, AMYLASE in the last 168 hours. No results for input(s): AMMONIA in the last 168 hours. CBC: Recent Labs  Lab 08/25/19 1434 08/25/19 1441 08/27/19 0327  WBC 13.7*  --  7.5  NEUTROABS 10.8*  --   --   HGB 13.4 13.6 12.9*  HCT 39.1 40.0 36.5*  MCV 90.9  --  89.2  PLT 202  --  188   Cardiac Enzymes: No results for input(s): CKTOTAL, CKMB, CKMBINDEX, TROPONINI in the last 168 hours. BNP: BNP (last 3 results) No results for input(s): BNP in the last 8760 hours.  ProBNP (last 3 results) No results for input(s): PROBNP in the last 8760 hours.  CBG: Recent Labs  Lab 08/25/19 2020  GLUCAP 136*       Signed:  Eulogio Bear  [facility] 08/27/2019, 7:37 AM

## 2019-08-28 LAB — HEMOGLOBIN A1C
Hgb A1c MFr Bld: 5.4 % (ref 4.8–5.6)
Mean Plasma Glucose: 108 mg/dL

## 2019-09-21 ENCOUNTER — Encounter: Payer: Self-pay | Admitting: Cardiology

## 2019-09-21 ENCOUNTER — Ambulatory Visit (INDEPENDENT_AMBULATORY_CARE_PROVIDER_SITE_OTHER): Payer: Medicare Other | Admitting: Cardiology

## 2019-09-21 ENCOUNTER — Other Ambulatory Visit: Payer: Self-pay

## 2019-09-21 VITALS — BP 136/77 | HR 70 | Temp 97.5°F | Ht 69.0 in | Wt 149.0 lb

## 2019-09-21 DIAGNOSIS — I6523 Occlusion and stenosis of bilateral carotid arteries: Secondary | ICD-10-CM | POA: Insufficient documentation

## 2019-09-21 DIAGNOSIS — I10 Essential (primary) hypertension: Secondary | ICD-10-CM | POA: Diagnosis not present

## 2019-09-21 DIAGNOSIS — I6521 Occlusion and stenosis of right carotid artery: Secondary | ICD-10-CM | POA: Diagnosis not present

## 2019-09-21 NOTE — Progress Notes (Signed)
Patient referred by Christain Sacramento, MD for carotid artery stenosis.  Subjective:   Arthur Guzman, male    DOB: 1946-03-30, 73 y.o.   MRN: [id number]   Chief Complaint  Patient presents with  . Carotid stenosis    right  . New Patient (Initial Visit)   HPI  73 year old Caucasian male referred for management of carotid stenosis.  Patient was admitted to [facility] in September 2020 with complaints of headache and difficulty understanding words, associated with nausea and vomiting. He also had right forearm tingling, numbness. Patient was seen by neurology and had complete neurological work-up.  All CT angiogram did not show any significant large vessel obstruction, carotid ultrasound was suggestive of right-sided moderate carotid stenosis.  Echocardiogram showed structurally normal heart.  Neurology felt his symptoms were either due to complicated migraines or TIAs.  DAPT was recommended for 3 weeks, followed by aspirin alone. He has not had any recurrence of the above episodes.    Past Medical History:  Diagnosis Date  . Hyperlipidemia      Past Surgical History:  Procedure Laterality Date  . BACK SURGERY     1995  . PATELLA FRACTURE SURGERY Left      Social History   Socioeconomic History  . Marital status: Married    Spouse name: Not on file  . Number of children: Not on file  . Years of education: Not on file  . Highest education level: Not on file  Occupational History  . Not on file  Social Needs  . Financial resource strain: Not on file  . Food insecurity    Worry: Not on file    Inability: Not on file  . Transportation needs    Medical: Not on file    Non-medical: Not on file  Tobacco Use  . Smoking status: Never Smoker  . Smokeless tobacco: Never Used  Substance and Sexual Activity  . Alcohol use: No  . Drug use: No  . Sexual activity: Not on file  Lifestyle  . Physical activity    Days per week: Not on file    Minutes per  session: Not on file  . Stress: Not on file  Relationships  . Social Herbalist on phone: Not on file    Gets together: Not on file    Attends religious service: Not on file    Active member of club or organization: Not on file    Attends meetings of clubs or organizations: Not on file    Relationship status: Not on file  . Intimate partner violence    Fear of current or ex partner: Not on file    Emotionally abused: Not on file    Physically abused: Not on file    Forced sexual activity: Not on file  Other Topics Concern  . Not on file  Social History Narrative  . Not on file     Family History  Problem Relation Age of Onset  . Cancer Mother   . Heart disease Mother      Current Outpatient Medications on File Prior to Visit  Medication Sig Dispense Refill  . acetaminophen (TYLENOL) 500 MG tablet Take 500 mg by mouth every 6 (six) hours as needed for mild pain or headache.    Marland Kitchen aspirin EC 81 MG EC tablet Take 1 tablet (81 mg total) by mouth daily.    Marland Kitchen atorvastatin (LIPITOR) 40 MG tablet Take 1 tablet (40 mg total)  by mouth daily at 6 PM. 30 tablet 1  . divalproex (DEPAKOTE) 500 MG DR tablet Take 1 tablet (500 mg total) by mouth at bedtime. 30 tablet 1  . lisinopril (ZESTRIL) 20 MG tablet Take 20 mg by mouth daily. Patient takes it prn if BP is high     No current facility-administered medications on file prior to visit.     Cardiovascular studies:  EKG 08/25/2019: Sinus rhythm 62 bpm.  Incomplete right bundle branch block.  Leftward axis.  MRI brain 08/26/2019: 1. No acute intracranial infarct or other abnormality identified. 2. 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  CTA head/neck 08/26/2019: 1. Negative CTA for large vessel occlusion. 2. Atherosclerotic change about the proximal cervical ICAs bilaterally, with associated  stenoses of up to 65% on the right and 55% on the left. 3. No hemodynamically significant or correctable stenosis within the intracranial circulation. 4. Fetal type origin of the PCAs with overall diminutive vertebrobasilar system.  Echocardiogram 08/25/2018:  1. The left ventricle has normal systolic function with an ejection fraction of 60-65%. The cavity size was normal. There is moderately increased left ventricular wall thickness. Left ventricular diastolic Doppler parameters are consistent with impaired  relaxation. Indeterminate filling pressures The E/e' is 8-15. No evidence of left ventricular regional wall motion abnormalities.  2. The right ventricle has normal systolic function. The cavity was normal. There is no increase in right ventricular wall thickness.  3. The mitral valve is grossly normal.  4. The tricuspid valve is grossly normal.  5. The aortic valve is tricuspid. No stenosis of the aortic valve.  6. The aorta is normal unless otherwise noted.  Vascular ultrasound 08/25/2018: Right Carotid: Velocities in the right ICA are consistent with a 40-59% stenosis. Left Carotid: Velocities in the left ICA are consistent with a 1-39% stenosis. Vertebrals:  Bilateral vertebral arteries demonstrate antegrade flow. Subclavians: Normal flow hemodynamics were seen in bilateral subclavian arteries.   Recent labs: Results for Arthur Guzman, Arthur Guzman (MRN [id number]) as of 09/21/2019 08:56  Ref. Range 08/27/2019 03:27  Sodium Latest Ref Range: 135 - 145 mmol/L 139  Potassium Latest Ref Range: 3.5 - 5.1 mmol/L 3.2 (L)  Chloride Latest Ref Range: 98 - 111 mmol/L 105  CO2 Latest Ref Range: 22 - 32 mmol/L 25  Glucose Latest Ref Range: 70 - 99 mg/dL 93  Mean Plasma Glucose Latest Units: mg/dL 108  BUN Latest Ref Range: 8 - 23 mg/dL 13  Creatinine Latest Ref Range: 0.61 - 1.24 mg/dL 1.03  Calcium Latest Ref Range: 8.9 - 10.3 mg/dL 8.7 (L)  Anion gap Latest Ref Range: 5 - 15  9  Alkaline  Phosphatase Latest Ref Range: 38 - 126 U/L 76  Albumin Latest Ref Range: 3.5 - 5.0 g/dL 3.1 (L)  AST Latest Ref Range: 15 - 41 U/L 21  ALT Latest Ref Range: 0 - 44 U/L 15  Total Protein Latest Ref Range: 6.5 - 8.1 g/dL 6.5  Total Bilirubin Latest Ref Range: 0.3 - 1.2 mg/dL 0.7  GFR, Est Non African American Latest Ref Range: >60 mL/min >60  GFR, Est African American Latest Ref Range: >60 mL/min >60   Results for Arthur Guzman, Arthur Guzman (MRN [id number]) as of 09/21/2019 08:56  Ref. Range 08/27/2019 03:27  WBC Latest Ref Range: 4.0 - 10.5 K/uL 7.5  RBC Latest Ref Range: 4.22 - 5.81 MIL/uL 4.09 (L)  Hemoglobin Latest Ref Range: 13.0 -  17.0 g/dL 12.9 (L)  HCT Latest Ref Range: 39.0 - 52.0 % 36.5 (L)  MCV Latest Ref Range: 80.0 - 100.0 fL 89.2  MCH Latest Ref Range: 26.0 - 34.0 pg 31.5  MCHC Latest Ref Range: 30.0 - 36.0 g/dL 35.3  RDW Latest Ref Range: 11.5 - 15.5 % 13.1  Platelets Latest Ref Range: 150 - 400 K/uL 188  nRBC Latest Ref Range: 0.0 - 0.2 % 0.0    Results for Arthur Guzman, Arthur Guzman (MRN [id number]) as of 09/21/2019 08:56  Ref. Range 08/27/2019 03:27  Total CHOL/HDL Ratio Latest Units: RATIO 5.5  Cholesterol Latest Ref Range: 0 - 200 mg/dL 169  HDL Cholesterol Latest Ref Range: >40 mg/dL 31 (L)  LDL (calc) Latest Ref Range: 0 - 99 mg/dL 101 (H)  Triglycerides Latest Ref Range: <150 mg/dL 183 (H)  VLDL Latest Ref Range: 0 - 40 mg/dL 37   Results for Arthur Guzman, Arthur Guzman (MRN [id number]) as of 09/21/2019 08:56  Ref. Range 08/27/2019 03:27  Glucose Latest Ref Range: 70 - 99 mg/dL 93  Hemoglobin A1C Latest Ref Range: 4.8 - 5.6 % 5.4     Review of Systems  Constitution: Negative for decreased appetite, malaise/fatigue, weight gain and weight loss.  HENT: Negative for congestion.   Eyes: Negative for visual disturbance.  Cardiovascular: Negative for chest pain, dyspnea on exertion, leg swelling, palpitations and syncope.  Respiratory: Negative for cough.   Endocrine: Negative for  cold intolerance.  Hematologic/Lymphatic: Does not bruise/bleed easily.  Skin: Negative for itching and rash.  Musculoskeletal: Negative for myalgias.  Gastrointestinal: Negative for abdominal pain, nausea and vomiting.  Genitourinary: Negative for dysuria.  Neurological: Negative for dizziness and weakness.  Psychiatric/Behavioral: The patient is not nervous/anxious.   All other systems reviewed and are negative.        Vitals:   09/21/19 1027  BP: (!) 151/72  Pulse: 71  Temp: (!) 97.5 F (36.4 C)  SpO2: 98%     Body mass index is 22 kg/m. Filed Weights   09/21/19 1027  Weight: 149 lb (67.6 kg)     Objective:   Physical Exam  Constitutional: He is oriented to person, place, and time. He appears well-developed and well-nourished. No distress.  HENT:  Head: Normocephalic and atraumatic.  Eyes: Pupils are equal, round, and reactive to light. Conjunctivae are normal.  Neck: No JVD present.  Cardiovascular: Normal rate, regular rhythm and intact distal pulses.  No murmur heard. Pulses:      Carotid pulses are on the right side with bruit. Pulmonary/Chest: Effort normal and breath sounds normal. He has no wheezes. He has no rales.  Abdominal: Soft. Bowel sounds are normal. There is no rebound.  Musculoskeletal:        General: No edema.  Lymphadenopathy:    He has no cervical adenopathy.  Neurological: He is alert and oriented to person, place, and time. No cranial nerve deficit.  Skin: Skin is warm and dry.  Psychiatric: He has a normal mood and affect.  Nursing note and vitals reviewed.         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   Moderate carotid stenosis: But CTA did not show any significant large vessel narrowing, carotid ultrasound showed 40-69% right carotid artery stenosis. This is consistent with his physical exam finding of right carotid bruit.  However, I do not think his symptoms can be explained by modest carotid stenosis alone. Also, his  numbness was on right hand, which again cannot be explained by Rt carotid stenosis. As such, I believe moderate stenosis is asymptomatic.  Continue risk factor modification and medical management.  He has completed 3 weeks of dual antiplatelet therapy after his episode in September 2020.  Continue aspirin 81 mg daily. Continue Lipitor 40 mg daily.  Recommend using lisinopril 20 mg daily, instead of prn. We will repeat carotid ultrasound in 6 months, along with lipid panel check.    Thank you for referring the patient to [place]. Please feel free to contact with any questions.  Nigel Mormon, MD [facility] Pager: [phone] Office: [phone] If no answer Cell [phone]

## 2019-09-22 ENCOUNTER — Encounter: Payer: Self-pay | Admitting: Neurology

## 2019-09-22 ENCOUNTER — Other Ambulatory Visit: Payer: Self-pay

## 2019-09-22 ENCOUNTER — Ambulatory Visit: Payer: Medicare Other | Admitting: Neurology

## 2019-09-22 VITALS — BP 120/62 | HR 65 | Temp 97.5°F | Ht 69.0 in | Wt 169.0 lb

## 2019-09-22 DIAGNOSIS — G43009 Migraine without aura, not intractable, without status migrainosus: Secondary | ICD-10-CM | POA: Diagnosis not present

## 2019-09-22 DIAGNOSIS — G441 Vascular headache, not elsewhere classified: Secondary | ICD-10-CM

## 2019-09-22 NOTE — Patient Instructions (Signed)
I had a long discussion with the patient and his wife regarding his recurrent episodes of headaches which likely represent migraine and the recent episode likely episode of complicated migraine.  I recommend he discontinue Depakote since he is not had any headaches for more than a month.  Check EEG.  I have advised him to avoid headache triggers and to use over-the-counter medications at the earliest onset of his headaches but to limit to not more than 2 to 3 days/week.  He will continue aspirin for stroke prevention and maintain strict control of lipids with LDL cholesterol goal below 70 mg percent and hypertension with blood pressure goal below 130/90.  He will return for follow-up in the future in 3 months or call earlier if necessary.

## 2019-09-22 NOTE — Progress Notes (Signed)
[facility] [address] [place]. [place] [address] [phone]       OFFICE CONSULT NOTE  Mr. CARSIN GIANNINI Date of Birth:  14-Aug-1946 Medical Record Number:  [id number]   Referring MD: Rosalin Hawking Reason for Referral: Complicated migraine HPI: Mr. Trimarco is a 73 year old pleasant Caucasian male seen today for initial office consultation visit.  He is accompanied by his wife.  History is obtained from them and review of electronic medical records and I personally reviewed imaging films in PACS.  Mr. Steltz has no significant past medical history.  He presented to [facility] on 08/25/2019 with complaint of difficulty speaking.  His symptoms began around 4 PM where he felt as though he needed something to say but was unable to get the words out.  His wife was next to him and noticed that he had a glassy appearance of his eyes and seemed a little bit disoriented.  He also subsequently complained of numbness sensation in his right forearm and headache.  Patient also had trouble squeezing with his right hand.  He had trouble focusing.  His headache lasted for around 10 to 12 hours while he was in the ER he got couple of medications which finally got rid of it.  Patient admits he had not slept well for several nights prior to this episode.  CT scan of the head showed no acute abnormality and CT angiogram of the brain was unremarkable and of the neck showed 65% right ICA and 55% left ICA stenosis.  However carotid ultrasound showed only 40 to 59% right ICA and 1 to 39% left ICA stenosis.  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  LDL cholesterol was 101 mg percent and hemoglobin A1c was 5.4.  Transthoracic echo showed normal ejection fraction. The patient also states that 2 weeks prior also had somewhat similar episode when he  was mowing the lawn he complained of a headache he had slow thinking and difficulty speaking.  His wife gave him Vistaril which made the patient sleepy and when he woke up the headache was gone.  Patient does have some intermittent headaches he states he gets some nauseous with those headaches but denies visual symptoms accompanying the headaches.  These headaches are often brought on by exposure to heat or lack of sleep.  He has never been formally diagnosed with migraines.  He has done well since this episode and not had any further headache or speech disturbance episodes in the last 4 weeks.  He was discharged on Depakote which is tolerating well without side effects.  He did not have an EEG in the hospital ROS:   14 system review of systems is positive for headache, staring, numbness, speech difficulty and nausea and all other systems negative  PMH:  Past Medical History:  Diagnosis Date  . Hyperlipidemia     Social History:  Social History   Socioeconomic History  . Marital status: Married    Spouse name: Not on file  . Number of children: 2  . Years of education: Not on file  . Highest education level: Not on file  Occupational History  . Not on file  Social Needs  . Financial resource strain: Not on file  . Food insecurity    Worry: Not on file    Inability: Not on file  . Transportation needs  Medical: Not on file    Non-medical: Not on file  Tobacco Use  . Smoking status: Never Smoker  . Smokeless tobacco: Never Used  Substance and Sexual Activity  . Alcohol use: No  . Drug use: No  . Sexual activity: Not on file  Lifestyle  . Physical activity    Days per week: Not on file    Minutes per session: Not on file  . Stress: Not on file  Relationships  . Social Herbalist on phone: Not on file    Gets together: Not on file    Attends religious service: Not on file    Active member of club or organization: Not on file    Attends meetings of clubs or  organizations: Not on file    Relationship status: Not on file  . Intimate partner violence    Fear of current or ex partner: Not on file    Emotionally abused: Not on file    Physically abused: Not on file    Forced sexual activity: Not on file  Other Topics Concern  . Not on file  Social History Narrative  . Not on file    Medications:   Current Outpatient Medications on File Prior to Visit  Medication Sig Dispense Refill  . acetaminophen (TYLENOL) 500 MG tablet Take 500 mg by mouth every 6 (six) hours as needed for mild pain or headache.    Marland Kitchen aspirin EC 81 MG EC tablet Take 1 tablet (81 mg total) by mouth daily.    Marland Kitchen atorvastatin (LIPITOR) 40 MG tablet Take 1 tablet (40 mg total) by mouth daily at 6 PM. 30 tablet 1  . lisinopril (ZESTRIL) 20 MG tablet Take 20 mg by mouth daily. Patient takes it prn if BP is high     No current facility-administered medications on file prior to visit.     Allergies:   Allergies  Allergen Reactions  . Antihistamines, Chlorpheniramine-Type     Antihistamines make me fidgety.    Physical Exam General: well developed, well nourished elderly Caucasian male, seated, in no evident distress Head: head normocephalic and atraumatic.   Neck: supple with no carotid or supraclavicular bruits Cardiovascular: regular rate and rhythm, no murmurs Musculoskeletal: no deformity Skin:  no rash/petichiae Vascular:  Normal pulses all extremities  Neurologic Exam Mental Status: Awake and fully alert. Oriented to place and time. Recent and remote memory intact. Attention span, concentration and fund of knowledge appropriate. Mood and affect appropriate.  Cranial Nerves: Fundoscopic exam reveals sharp disc margins. Pupils equal, briskly reactive to light. Extraocular movements full without nystagmus. Visual fields full to confrontation. Hearing diminished bilaterally. Facial sensation intact. Face, tongue, palate moves normally and symmetrically.  Motor: Normal  bulk and tone. Normal strength in all tested extremity muscles. Sensory.: intact to touch , pinprick , position and vibratory sensation.  Coordination: Rapid alternating movements normal in all extremities. Finger-to-nose and heel-to-shin performed accurately bilaterally. Gait and Station: Arises from chair without difficulty. Stance is normal. Gait demonstrates normal stride length and balance . Able to heel, toe and tandem walk without difficulty.  Reflexes: 1+ and symmetric. Toes downgoing.       ASSESSMENT: 73 year old Caucasian male with recurrent episodes of headaches with recent episode of language disturbance possibly a complicated migraine episode.  Brain imaging is negative for stroke.     PLAN: I had a long discussion with the patient and his wife regarding his recurrent episodes of headaches which likely represent  migraine and the recent episode likely episode of complicated migraine.  I recommend he discontinue Depakote since he is not had any headaches for more than a month.  Check EEG.  I have advised him to avoid headache triggers and to use over-the-counter medications at the earliest onset of his headaches but to limit to not more than 2 to 3 days/week.  He will continue aspirin for stroke prevention and maintain strict control of lipids with LDL cholesterol goal below 70 mg percent and hypertension with blood pressure goal below 130/90.  Greater than 50% time during this 45-minute consultation visit was spent on counseling and coordination of care about episode of possible complicated migraine versus seizure and TIA he will return for follow-up in the future in 3 months or call earlier if necessary. Antony Contras, MD  [facility] [address] [place], [place] [address]  Phone [phone] Fax [phone] Note: This document was prepared with digital dictation and possible smart phrase technology. Any transcriptional errors that result from  this process are unintentional.

## 2019-09-29 ENCOUNTER — Encounter: Payer: Self-pay | Admitting: Gastroenterology

## 2019-09-29 ENCOUNTER — Ambulatory Visit (INDEPENDENT_AMBULATORY_CARE_PROVIDER_SITE_OTHER): Payer: Medicare Other | Admitting: Gastroenterology

## 2019-09-29 ENCOUNTER — Other Ambulatory Visit: Payer: Self-pay

## 2019-09-29 VITALS — BP 120/60 | HR 72 | Temp 98.1°F | Ht 68.5 in | Wt 167.2 lb

## 2019-09-29 DIAGNOSIS — R142 Eructation: Secondary | ICD-10-CM | POA: Diagnosis not present

## 2019-09-29 DIAGNOSIS — R1013 Epigastric pain: Secondary | ICD-10-CM | POA: Diagnosis not present

## 2019-09-29 NOTE — Progress Notes (Signed)
[facility] Consult Note:  History: Arthur Guzman 09/29/2019  Referring provider: Nickola Major, MD  Reason for consult/chief complaint: Abdominal Pain (intermittent), nausea and vomiting (intermittent, empty nausea feeling), and Gas   Subjective  HPI: Recent TIA manifested as frontal headache right-sided weakness and a aphasia lasting several hours.  Discharge summary reviewed, indicates MRI brain negative for acute infarct, CT angiogram of head and neck without large vessel obstruction, 2D echocardiogram LVEF 60%.  Discharged on aspirin and Plavix for 3 weeks, then just aspirin as well as Depakote for question of complicated migraine. Follow-up neurology clinic appointment on 09/22/2019 thought this may have been complicated migraine, and recommended discontinuing Depakote.  He is here for several years of an upper abdominal discomfort that he has a difficult time describing.  It has occurred intermittently, then will be gone for long periods.  It often occurs in the morning, where he will have a "weak and empty feeling" perhaps with some nausea as well, then it resolves after he eats.  He denies vomiting, dysphagia, anorexia or weight loss.  His bowel habits are regular without rectal bleeding.  He belches intermittently .  Overall, the symptoms have remained intermittent and nonprogressive as long as he has had them.  When asked about colon cancer screening, he thinks perhaps he has done some stool testing in the past but had a colonoscopy.  He thinks he may have declined that request by primary care in the past because he feels well.  ROS:  Review of Systems  Constitutional: Negative for appetite change and unexpected weight change.  HENT: Negative for mouth sores and voice change.   Eyes: Negative for pain and redness.  Respiratory: Negative for cough and shortness of breath.   Cardiovascular: Negative for chest pain and palpitations.  Genitourinary:  Negative for dysuria and hematuria.  Musculoskeletal: Negative for arthralgias and myalgias.  Skin: Negative for pallor and rash.  Neurological: Positive for headaches. Negative for weakness.  Hematological: Negative for adenopathy.     Past Medical History: Past Medical History:  Diagnosis Date  . HTN (hypertension)   . Hyperlipidemia   . Migraine   Recent neurologic event as noted above   Past Surgical History: Past Surgical History:  Procedure Laterality Date  . CATARACT EXTRACTION Right 2017  . [place] SURGERY     1995  . PATELLA FRACTURE SURGERY Left      Family History: Family History  Problem Relation Age of Onset  . Cancer Mother        type unknown  . Heart disease Mother   . Stroke Brother     Social History: Social History   Socioeconomic History  . Marital status: Married    Spouse name: Not on file  . Number of children: 2  . Years of education: Not on file  . Highest education level: Not on file  Occupational History  . Occupation: Dealer  Social Needs  . Financial resource strain: Not on file  . Food insecurity    Worry: Not on file    Inability: Not on file  . Transportation needs    Medical: Not on file    Non-medical: Not on file  Tobacco Use  . Smoking status: Never Smoker  . Smokeless tobacco: Never Used  Substance and Sexual Activity  . Alcohol use: No  . Drug use: No  . Sexual activity: Not on file  Lifestyle  . Physical activity    Days per week: Not  on file    Minutes per session: Not on file  . Stress: Not on file  Relationships  . Social Herbalist on phone: Not on file    Gets together: Not on file    Attends religious service: Not on file    Active member of club or organization: Not on file    Attends meetings of clubs or organizations: Not on file    Relationship status: Not on file  Other Topics Concern  . Not on file  Social History Narrative  . Not on file    Allergies: Allergies   Allergen Reactions  . Antihistamines, Chlorpheniramine-Type     Antihistamines make me fidgety.    Outpatient Meds: Current Outpatient Medications  Medication Sig Dispense Refill  . acetaminophen (TYLENOL) 500 MG tablet Take 500 mg by mouth as needed for mild pain or headache.     Marland Kitchen aspirin EC 81 MG EC tablet Take 1 tablet (81 mg total) by mouth daily.    Marland Kitchen atorvastatin (LIPITOR) 40 MG tablet Take 1 tablet (40 mg total) by mouth daily at 6 PM. 30 tablet 1  . lisinopril (ZESTRIL) 20 MG tablet Take 20 mg by mouth daily. Patient takes it prn if BP is high     No current facility-administered medications for this visit.       ___________________________________________________________________ Objective   Exam:  BP 120/60 (BP Location: Left Arm, Patient Position: Sitting, Cuff Size: Normal)   Pulse 72   Temp 98.1 F (36.7 C)   Ht 5' 8.5" (1.74 m) Comment: height measured without shoes  Wt 167 lb 4 oz (75.9 kg)   BMI 25.06 kg/m    General: He is well-appearing, pleasant and conversational without muscle wasting  Eyes: sclera anicteric, no redness  ENT: oral mucosa moist without lesions, no cervical or supraclavicular lymphadenopathy  CV: RRR without murmur, S1/S2, no JVD, no peripheral edema  Resp: clear to auscultation bilaterally, normal RR and effort noted  GI: soft, no tenderness, with active bowel sounds. No guarding or palpable organomegaly noted.  No bruit  Skin; warm and dry, no rash or jaundice noted  Neuro: awake, alert and oriented x 3. Normal gross motor function and fluent speech  Labs:  CBC Latest Ref Rng & Units 08/27/2019 08/25/2019 08/25/2019  WBC 4.0 - 10.5 K/uL 7.5 - 13.7(H)  Hemoglobin 13.0 - 17.0 g/dL 12.9(L) 13.6 13.4  Hematocrit 39.0 - 52.0 % 36.5(L) 40.0 39.1  Platelets 150 - 400 K/uL 188 - 202   CMP Latest Ref Rng & Units 08/27/2019 08/25/2019 08/25/2019  Glucose 70 - 99 mg/dL 93 108(H) 112(H)  BUN 8 - 23 mg/dL 13 15 14   Creatinine 0.61 - 1.24  mg/dL 1.03 1.00 0.97  Sodium 135 - 145 mmol/L 139 138 138  Potassium 3.5 - 5.1 mmol/L 3.2(L) 3.7 3.8  Chloride 98 - 111 mmol/L 105 100 101  CO2 22 - 32 mmol/L 25 - 29  Calcium 8.9 - 10.3 mg/dL 8.7(L) - 9.2  Total Protein 6.5 - 8.1 g/dL 6.5 - 7.3  Total Bilirubin 0.3 - 1.2 mg/dL 0.7 - 0.6  Alkaline Phos 38 - 126 U/L 76 - 70  AST 15 - 41 U/L 21 - 20  ALT 0 - 44 U/L 15 - 17   Albumin 3.7 on admission to the hospital on 08/25/2019  Radiologic Studies:  No abdominal imaging on file  Assessment: Encounter Diagnoses  Name Primary?  . Dyspepsia Yes  . Belching  Years of intermittent nonprogressive somewhat vague upper GI symptoms.  His appetite remains good, he denies dysphagia or weight loss.  Overall, it sounds benign and the yield of further testing such as imaging or endoscopy seems low. I again brought up the question of colon cancer screening, and he politely declines since he feels well.  Plan:  He will see me as needed.  Thank you for the courtesy of this consult.  Please call me with any questions or concerns.  Nelida Meuse III  CC: Referring provider noted above

## 2019-09-29 NOTE — Patient Instructions (Signed)
If you are age 74 or older, your body mass index should be between 23-30. Your Body mass index is 25.06 kg/m. If this is out of the aforementioned range listed, please consider follow up with your Primary Care Provider.  If you are age 42 or younger, your body mass index should be between 19-25. Your Body mass index is 25.06 kg/m. If this is out of the aformentioned range listed, please consider follow up with your Primary Care Provider.   Follow up as needed.  It was a pleasure to see you today!  Dr. Loletha Carrow

## 2019-09-30 ENCOUNTER — Inpatient Hospital Stay: Payer: Medicare Other | Admitting: Adult Health

## 2019-10-08 ENCOUNTER — Ambulatory Visit: Payer: Medicare Other | Admitting: Neurology

## 2019-10-08 ENCOUNTER — Other Ambulatory Visit: Payer: Self-pay

## 2019-10-08 DIAGNOSIS — R2990 Unspecified symptoms and signs involving the nervous system: Secondary | ICD-10-CM | POA: Diagnosis not present

## 2019-10-08 DIAGNOSIS — G43009 Migraine without aura, not intractable, without status migrainosus: Secondary | ICD-10-CM

## 2019-12-30 ENCOUNTER — Ambulatory Visit: Payer: Medicare Other | Admitting: Neurology

## 2019-12-30 ENCOUNTER — Encounter: Payer: Self-pay | Admitting: Neurology

## 2019-12-30 ENCOUNTER — Other Ambulatory Visit: Payer: Self-pay

## 2019-12-30 VITALS — BP 149/72 | HR 66 | Temp 97.2°F | Ht 69.0 in | Wt 161.4 lb

## 2019-12-30 DIAGNOSIS — G43009 Migraine without aura, not intractable, without status migrainosus: Secondary | ICD-10-CM | POA: Diagnosis not present

## 2019-12-30 NOTE — Patient Instructions (Signed)
I had a long discussion with the patient regarding his episode of TIA versus atypical migraine and recommend he continue aggressive risk factor modification and stay on aspirin for stroke prevention.  He is currently off his blood pressure and cholesterol medications and recommend a low-salt diet as well as low-fat diet and he will have follow-up lab work in March at his upcoming visit with primary care physician consider restarting the medications if cholesterol is not satisfactory.  No routine schedule appointment is necessary but he may be referred back in the future as needed.

## 2019-12-30 NOTE — Progress Notes (Signed)
[facility] [address] [place]. [place] [address] [phone]       OFFICE FOLLW UP VISIT NOTE  [name] Date of Birth:  September 16, 1946 Medical Record Number:  [id number]   Referring MD: Rosalin Hawking Reason for Referral: Complicated migraine [id number] visit 09/22/2019: Arthur Guzman is a 74 year old pleasant Caucasian male seen today for initial office consultation visit.  He is accompanied by his wife.  History is obtained from them and review of electronic medical records and I personally reviewed imaging films in PACS.  Arthur Guzman has no significant past medical history.  He presented to [facility] ER on 08/25/2019 with complaint of difficulty speaking.  His symptoms began around 4 PM where he felt as though he needed something to say but was unable to get the words out.  His wife was next to him and noticed that he had a glassy appearance of his eyes and seemed a little bit disoriented.  He also subsequently complained of numbness sensation in his right forearm and headache.  Patient also had trouble squeezing with his right hand.  He had trouble focusing.  His headache lasted for around 10 to 12 hours while he was in the ER he got couple of medications which finally got rid of it.  Patient admits he had not slept well for several nights prior to this episode.  CT scan of the head showed no acute abnormality and CT angiogram of the brain was unremarkable and of the neck showed 65% right ICA and 55% left ICA stenosis.  However carotid ultrasound showed only 40 to 59% right ICA and 1 to 39% left ICA stenosis.  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  LDL cholesterol was 101 mg percent and hemoglobin A1c was 5.4.  Transthoracic echo showed normal ejection fraction. The patient also states that 2 weeks prior also had  somewhat similar episode when he was mowing the lawn he complained of a headache he had slow thinking and difficulty speaking.  His wife gave him Vistaril which made the patient sleepy and when he woke up the headache was gone.  Patient does have some intermittent headaches he states he gets some nauseous with those headaches but denies visual symptoms accompanying the headaches.  These headaches are often brought on by exposure to heat or lack of sleep.  He has never been formally diagnosed with migraines.  He has done well since this episode and not had any further headache or speech disturbance episodes in the last 4 weeks.  He was discharged on Depakote which is tolerating well without side effects.  He did not have an EEG in the hospital Update 12/30/2019 : He returns for follow-up after last visit 3 months ago.  He states he is doing well has had no further episodes of speech disturbance numbness or weakness in his hand.  He is also not had any headache episodes.  The patient states he discontinued his aspirin and Lipitor as he had some irritation in his throat which went away after stopping the medicines.  He says he is watching his diet and blood pressure is usually in the 130s at home though today it is elevated at 149/72 in the office.  He does have a upcoming primary care physician visit where he will have lab work in March.  He denies any stroke or TIA  symptoms.  He has no new complaints. ROS:   14 system review of systems is positive for no complaints today and all other systems negative  PMH:  Past Medical History:  Diagnosis Date  . HTN (hypertension)   . Hyperlipidemia   . Migraine     Social History:  Social History   Socioeconomic History  . Marital status: Married    Spouse name: Not on file  . Number of children: 2  . Years of education: Not on file  . Highest education level: Not on file  Occupational History  . Occupation: Dealer  Tobacco Use  . Smoking status: Never  Smoker  . Smokeless tobacco: Never Used  Substance and Sexual Activity  . Alcohol use: No  . Drug use: No  . Sexual activity: Not on file  Other Topics Concern  . Not on file  Social History Narrative  . Not on file   Social Determinants of Health   Financial Resource Strain:   . Difficulty of Paying Living Expenses: Not on file  Food Insecurity:   . Worried About Charity fundraiser in the Last Year: Not on file  . Ran Out of Food in the Last Year: Not on file  Transportation Needs:   . Lack of Transportation (Medical): Not on file  . Lack of Transportation (Non-Medical): Not on file  Physical Activity:   . Days of Exercise per Week: Not on file  . Minutes of Exercise per Session: Not on file  Stress:   . Feeling of Stress : Not on file  Social Connections:   . Frequency of Communication with Friends and Family: Not on file  . Frequency of Social Gatherings with Friends and Family: Not on file  . Attends Religious Services: Not on file  . Active Member of Clubs or Organizations: Not on file  . Attends Archivist Meetings: Not on file  . Marital Status: Not on file  Intimate Partner Violence:   . Fear of Current or Ex-Partner: Not on file  . Emotionally Abused: Not on file  . Physically Abused: Not on file  . Sexually Abused: Not on file    Medications:   Current Outpatient Medications on File Prior to Visit  Medication Sig Dispense Refill  . aspirin EC 81 MG EC tablet Take 1 tablet (81 mg total) by mouth daily.     No current facility-administered medications on file prior to visit.    Allergies:   Allergies  Allergen Reactions  . Antihistamines, Chlorpheniramine-Type     Antihistamines make me fidgety.    Physical Exam General: well developed, well nourished elderly Caucasian male, seated, in no evident distress Head: head normocephalic and atraumatic.   Neck: supple with no carotid or supraclavicular bruits Cardiovascular: regular rate and  rhythm, no murmurs Musculoskeletal: no deformity Skin:  no rash/petichiae Vascular:  Normal pulses all extremities  Neurologic Exam Mental Status: Awake and fully alert. Oriented to place and time. Recent and remote memory intact. Attention span, concentration and fund of knowledge appropriate. Mood and affect appropriate.  Cranial Nerves: Fundoscopic exam not done pupils equal, briskly reactive to light. Extraocular movements full without nystagmus. Visual fields full to confrontation. Hearing diminished bilaterally. Facial sensation intact. Face, tongue, palate moves normally and symmetrically.  Motor: Normal bulk and tone. Normal strength in all tested extremity muscles. Sensory.: intact to touch , pinprick , position and vibratory sensation.  Coordination: Rapid alternating movements normal in all extremities. Finger-to-nose and heel-to-shin performed  accurately bilaterally. Gait and Station: Arises from chair without difficulty. Stance is normal. Gait demonstrates normal stride length and balance . Able to heel, toe and tandem walk without difficulty.  Reflexes: 1+ and symmetric. Toes downgoing.       ASSESSMENT: 74 year old Caucasian male with recurrent episodes of headaches with recent episode of language disturbance possibly a complicated migraine episode.  Brain imaging is negative for stroke.     PLAN: I had a long discussion with the patient regarding his episode of TIA versus atypical migraine and recommend he continue aggressive risk factor modification and stay on aspirin for stroke prevention.  He is currently off his blood pressure and cholesterol medications and recommend a low-salt diet as well as low-fat diet and he will have follow-up lab work in March at his upcoming visit with primary care physician consider restarting the medications if cholesterol is not satisfactory.  No routine schedule appointment is necessary but he may be referred back in the future as needed.   Greater than 50% time during this 25-minute visit was spent on counseling and coordination of care about episode of possible complicated migraine versus seizure and TIA . Antony Contras, MD  [facility] [address] [place], [place] [address]  Phone [phone] Fax [phone] Note: This document was prepared with digital dictation and possible smart phrase technology. Any transcriptional errors that result from this process are unintentional.

## 2020-03-10 LAB — LIPID PANEL
Chol/HDL Ratio: 3 ratio (ref 0.0–5.0)
Cholesterol, Total: 106 mg/dL (ref 100–199)
HDL: 35 mg/dL — ABNORMAL LOW (ref >39)
LDL Chol Calc (NIH): 49 mg/dL (ref 0–99)
Triglycerides: 125 mg/dL (ref 0–149)
VLDL Cholesterol Cal: 22 mg/dL (ref 5–40)

## 2020-03-14 ENCOUNTER — Ambulatory Visit: Payer: Medicare Other

## 2020-03-14 ENCOUNTER — Other Ambulatory Visit: Payer: Self-pay

## 2020-03-14 DIAGNOSIS — I6521 Occlusion and stenosis of right carotid artery: Secondary | ICD-10-CM

## 2020-03-14 DIAGNOSIS — I6523 Occlusion and stenosis of bilateral carotid arteries: Secondary | ICD-10-CM

## 2020-03-15 ENCOUNTER — Other Ambulatory Visit: Payer: Self-pay | Admitting: Cardiology

## 2020-03-15 DIAGNOSIS — I6523 Occlusion and stenosis of bilateral carotid arteries: Secondary | ICD-10-CM

## 2020-03-21 ENCOUNTER — Ambulatory Visit: Payer: Medicare Other | Admitting: Cardiology

## 2020-03-21 ENCOUNTER — Encounter: Payer: Self-pay | Admitting: Cardiology

## 2020-03-21 ENCOUNTER — Other Ambulatory Visit: Payer: Self-pay

## 2020-03-21 VITALS — BP 146/77 | HR 79 | Temp 98.6°F | Ht 69.0 in | Wt 163.0 lb

## 2020-03-21 DIAGNOSIS — I6521 Occlusion and stenosis of right carotid artery: Secondary | ICD-10-CM

## 2020-03-21 DIAGNOSIS — R030 Elevated blood-pressure reading, without diagnosis of hypertension: Secondary | ICD-10-CM

## 2020-03-21 NOTE — Progress Notes (Signed)
Patient referred by Christain Sacramento, MD for carotid artery stenosis.  Subjective:   Arthur Guzman, male    DOB: [birth year], 74 y.o.   MRN: [id number]   Chief Complaint  Patient presents with  . Carotid artery stenosis  . Follow-up   HPI  74 year old Caucasian male with h/o complex migraine/>TIA, moderate asymptomatic right carotid stenosis.  Patient was admitted to [facility] in September 2020 with complaints of headache and difficulty understanding words, associated with nausea and vomiting. He also had right forearm tingling, numbness. Patient was seen by neurology and had complete neurological work-up.  While CT angiogram did not show any significant large vessel obstruction, carotid ultrasound was suggestive of right-sided moderate carotid stenosis.  Echocardiogram showed structurally normal heart.  Neurology felt his symptoms were either due to complicated migraines or TIAs.  DAPT was recommended for 3 weeks, followed by aspirin alone. He has not had any recurrence of the above episodes.   Recent carotid US again showed moderate right and mild left carotid stenosis. He denies chest pain, shortness of breath, palpitations, leg edema, orthopnea, PND, TIA/syncope. Blood pressure is elevated today.    Current Outpatient Medications on File Prior to Visit  Medication Sig Dispense Refill  . aspirin EC 81 MG EC tablet Take 1 tablet (81 mg total) by mouth daily.    Marland Kitchen atorvastatin (LIPITOR) 40 MG tablet Take 40 mg by mouth daily.     No current facility-administered medications on file prior to visit.    Cardiovascular studies:  EKG 03/21/2020: Sinus rhythm 75 bpm. Left anterior fascicular block.   Carotid artery duplex 03/14/2020:  Stenosis in the right internal carotid artery (50-69%). Stenosis in the  right common carotid artery (<50%).  Stenosis in the left internal carotid artery (16-49%). Stenosis in the  left external carotid artery (<50%).  Antegrade right  vertebral artery flow. Antegrade left vertebral artery  flow.  Follow up in six months is appropriate if clinically indicated.  MRI brain 08/26/2019: 1. No acute intracranial infarct or other abnormality identified. 2. 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  CTA head/neck 08/26/2019: 1. Negative CTA for large vessel occlusion. 2. 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 3. No hemodynamically significant or correctable stenosis within the intracranial circulation. 4. Fetal type origin of the PCAs with overall diminutive vertebrobasilar system.  Echocardiogram 08/25/2018:  1. The left ventricle has normal systolic function with an ejection fraction of 60-65%. The cavity size was normal. There is moderately increased left ventricular wall thickness. Left ventricular diastolic Doppler parameters are consistent with impaired  relaxation. Indeterminate filling pressures The E/e' is 8-15. No evidence of left ventricular regional wall motion abnormalities.  2. The right ventricle has normal systolic function. The cavity was normal. There is no increase in right ventricular wall thickness.  3. The mitral valve is grossly normal.  4. The tricuspid valve is grossly normal.  5. The aortic valve is tricuspid. No stenosis of the aortic valve.  6. The aorta is normal unless otherwise noted.  Recent labs: 03/09/2020: Chol 106, TG 125, HDL 35, LDL 101  08/27/2019: Glucose 93, BUN/Cr 13/1.03. EGFR >60. Na/K 139/3.2. Albumin 3.1. Rest of the CMP normal H/H 12.9/36.5. MCV 89. Platelets 188 HbA1C 5.4% Chol 169, TG 183, HDL 31, LDL 101    Review  of Systems  Cardiovascular: Negative for chest pain, dyspnea on exertion, leg swelling, palpitations and syncope.          Vitals:   03/21/20 1049 03/21/20 1107  BP: (!) 154/70 (!) 146/77  Pulse: 77 79  Temp: 98.6 F (37 C)   SpO2: 99%      Body mass index is 24.07 kg/m. Filed Weights   03/21/20 1049  Weight: 163 lb (73.9 kg)     Objective:   Physical Exam  Constitutional: He appears well-developed and well-nourished.  Neck: No JVD present.  Cardiovascular: Normal rate, regular rhythm, normal heart sounds and intact distal pulses.  No murmur heard. Pulses:      Carotid pulses are on the right side with bruit. Pulmonary/Chest: Effort normal and breath sounds normal. He has no wheezes. He has no rales.  Musculoskeletal:        General: No edema.  Nursing note and vitals reviewed.         Assessment & Recommendations:   74 year old Caucasian male with h/o complex migraine/>TIA, moderate asymptomatic right carotid stenosis.  Moderate Rt carotid stenosis: Stable, asymptomatic. Continue Aspirin, statin.  Elevated blood pressure without diagnosis of hypertension: He is reluctant to participate in remote patient monitoring program for hypertension. He would like to f/u w/his PCP re: blood pressure management.  F/u w/me in 1 year.     Nigel Mormon, MD [facility] Pager: [phone] Office: [phone] If no answer Cell [phone]

## 2020-09-15 ENCOUNTER — Ambulatory Visit: Payer: Medicare Other

## 2020-09-15 ENCOUNTER — Other Ambulatory Visit: Payer: Self-pay

## 2020-09-15 DIAGNOSIS — I6523 Occlusion and stenosis of bilateral carotid arteries: Secondary | ICD-10-CM

## 2020-09-18 ENCOUNTER — Other Ambulatory Visit: Payer: Self-pay | Admitting: Cardiology

## 2020-09-18 DIAGNOSIS — I6523 Occlusion and stenosis of bilateral carotid arteries: Secondary | ICD-10-CM

## 2020-09-18 DIAGNOSIS — I6521 Occlusion and stenosis of right carotid artery: Secondary | ICD-10-CM

## 2020-12-05 ENCOUNTER — Inpatient Hospital Stay (HOSPITAL_COMMUNITY)
Admission: EM | Admit: 2020-12-05 | Discharge: 2020-12-08 | DRG: 419 | Disposition: A | Discharge status: Home / Self Care | Payer: Medicare Other | Attending: Internal Medicine | Admitting: Internal Medicine

## 2020-12-05 ENCOUNTER — Encounter (HOSPITAL_COMMUNITY): Payer: Self-pay | Admitting: Emergency Medicine

## 2020-12-05 ENCOUNTER — Observation Stay (HOSPITAL_COMMUNITY): Payer: Medicare Other

## 2020-12-05 ENCOUNTER — Other Ambulatory Visit: Payer: Self-pay | Admitting: Family Medicine

## 2020-12-05 ENCOUNTER — Emergency Department (HOSPITAL_COMMUNITY): Payer: Medicare Other

## 2020-12-05 DIAGNOSIS — Z20822 Contact with and (suspected) exposure to covid-19: Secondary | ICD-10-CM | POA: Diagnosis present

## 2020-12-05 DIAGNOSIS — R1011 Right upper quadrant pain: Secondary | ICD-10-CM

## 2020-12-05 DIAGNOSIS — I10 Essential (primary) hypertension: Secondary | ICD-10-CM | POA: Diagnosis present

## 2020-12-05 DIAGNOSIS — C8590 Non-Hodgkin lymphoma, unspecified, unspecified site: Secondary | ICD-10-CM

## 2020-12-05 DIAGNOSIS — K8012 Calculus of gallbladder with acute and chronic cholecystitis without obstruction: Secondary | ICD-10-CM | POA: Diagnosis not present

## 2020-12-05 DIAGNOSIS — Z79899 Other long term (current) drug therapy: Secondary | ICD-10-CM

## 2020-12-05 DIAGNOSIS — R590 Localized enlarged lymph nodes: Secondary | ICD-10-CM | POA: Diagnosis present

## 2020-12-05 DIAGNOSIS — E785 Hyperlipidemia, unspecified: Secondary | ICD-10-CM | POA: Diagnosis present

## 2020-12-05 DIAGNOSIS — Z7982 Long term (current) use of aspirin: Secondary | ICD-10-CM

## 2020-12-05 DIAGNOSIS — K8000 Calculus of gallbladder with acute cholecystitis without obstruction: Secondary | ICD-10-CM

## 2020-12-05 DIAGNOSIS — R918 Other nonspecific abnormal finding of lung field: Secondary | ICD-10-CM

## 2020-12-05 DIAGNOSIS — K828 Other specified diseases of gallbladder: Secondary | ICD-10-CM | POA: Diagnosis present

## 2020-12-05 DIAGNOSIS — N5089 Other specified disorders of the male genital organs: Secondary | ICD-10-CM | POA: Diagnosis present

## 2020-12-05 DIAGNOSIS — D72829 Elevated white blood cell count, unspecified: Secondary | ICD-10-CM

## 2020-12-05 DIAGNOSIS — E876 Hypokalemia: Secondary | ICD-10-CM | POA: Diagnosis present

## 2020-12-05 DIAGNOSIS — R932 Abnormal findings on diagnostic imaging of liver and biliary tract: Secondary | ICD-10-CM

## 2020-12-05 DIAGNOSIS — R52 Pain, unspecified: Secondary | ICD-10-CM

## 2020-12-05 DIAGNOSIS — K8010 Calculus of gallbladder with chronic cholecystitis without obstruction: Secondary | ICD-10-CM

## 2020-12-05 DIAGNOSIS — Z8673 Personal history of transient ischemic attack (TIA), and cerebral infarction without residual deficits: Secondary | ICD-10-CM

## 2020-12-05 LAB — MAGNESIUM: Magnesium: 1.6 mg/dL — ABNORMAL LOW (ref 1.7–2.4)

## 2020-12-05 LAB — COMPREHENSIVE METABOLIC PANEL
ALT: 23 U/L (ref 0–44)
AST: 24 U/L (ref 15–41)
Albumin: 3.8 g/dL (ref 3.5–5.0)
Alkaline Phosphatase: 89 U/L (ref 38–126)
Anion gap: 9 (ref 5–15)
BUN: 14 mg/dL (ref 8–23)
CO2: 27 mmol/L (ref 22–32)
Calcium: 9 mg/dL (ref 8.9–10.3)
Chloride: 101 mmol/L (ref 98–111)
Creatinine, Ser: 0.84 mg/dL (ref 0.61–1.24)
GFR, Estimated: >60 mL/min (ref >60)
Glucose, Bld: 103 mg/dL — ABNORMAL HIGH (ref 70–99)
Potassium: 3.4 mmol/L — ABNORMAL LOW (ref 3.5–5.1)
Sodium: 137 mmol/L (ref 135–145)
Total Bilirubin: 0.7 mg/dL (ref 0.3–1.2)
Total Protein: 7.2 g/dL (ref 6.5–8.1)

## 2020-12-05 LAB — CBC WITH DIFFERENTIAL/PLATELET
Abs Immature Granulocytes: 0.04 10*3/uL (ref 0.00–0.07)
Basophils Absolute: 0 10*3/uL (ref 0.0–0.1)
Basophils Relative: 0 %
Eosinophils Absolute: 0 10*3/uL (ref 0.0–0.5)
Eosinophils Relative: 0 %
HCT: 39.2 % (ref 39.0–52.0)
Hemoglobin: 13.6 g/dL (ref 13.0–17.0)
Immature Granulocytes: 0 %
Lymphocytes Relative: 20 %
Lymphs Abs: 2.5 10*3/uL (ref 0.7–4.0)
MCH: 31.8 pg (ref 26.0–34.0)
MCHC: 34.7 g/dL (ref 30.0–36.0)
MCV: 91.6 fL (ref 80.0–100.0)
Monocytes Absolute: 1.4 10*3/uL — ABNORMAL HIGH (ref 0.1–1.0)
Monocytes Relative: 11 %
Neutro Abs: 8.8 10*3/uL — ABNORMAL HIGH (ref 1.7–7.7)
Neutrophils Relative %: 69 %
Platelets: 174 10*3/uL (ref 150–400)
RBC: 4.28 MIL/uL (ref 4.22–5.81)
RDW: 13.2 % (ref 11.5–15.5)
WBC: 12.7 10*3/uL — ABNORMAL HIGH (ref 4.0–10.5)
nRBC: 0 % (ref 0.0–0.2)

## 2020-12-05 LAB — LACTIC ACID, PLASMA
Lactic Acid, Venous: 1 mmol/L (ref 0.5–1.9)
Lactic Acid, Venous: 1 mmol/L (ref 0.5–1.9)

## 2020-12-05 LAB — RESP PANEL BY RT-PCR (FLU A&B, COVID) ARPGX2
Influenza A by PCR: NEGATIVE
Influenza B by PCR: NEGATIVE
SARS Coronavirus 2 by RT PCR: NEGATIVE

## 2020-12-05 LAB — LIPASE, BLOOD: Lipase: 27 U/L (ref 11–51)

## 2020-12-05 IMAGING — CT CT ABD-PELV W/ CM
2 of 5 series · 15 of 46 positions shown, 17 images · IV contrast (APPLIED)
Comparison: Abdominal ultrasound dated [DATE].

CLINICAL DATA: 74-year-old male with abdominal pain.

EXAM:
CT ABDOMEN AND PELVIS WITH CONTRAST
TECHNIQUE: Multidetector CT imaging of the abdomen and pelvis was performed
using the standard protocol following bolus administration of
intravenous contrast.
CONTRAST:  100mL OMNIPAQUE IOHEXOL 300 MG/ML  SOLN

[Series 2: axial st · axial · 0.76mm/px · z∈[-389,-9]mm · 12 of 88 slices shown, 14 images]
[im 6/88  soft-tissue]
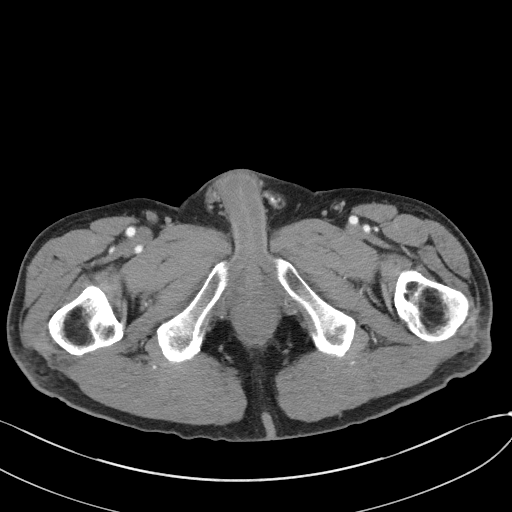
[im 6/88  bone]
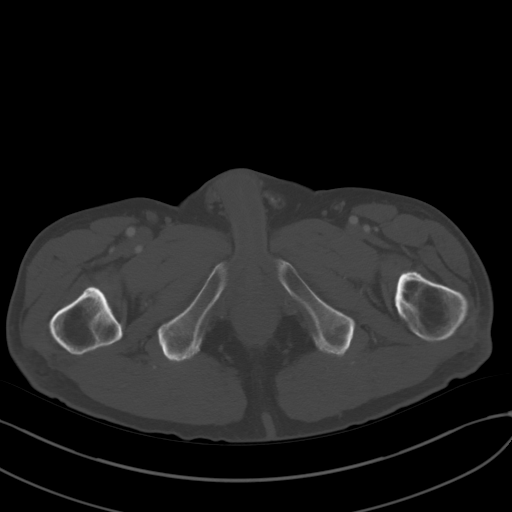
[im 12/88  soft-tissue]
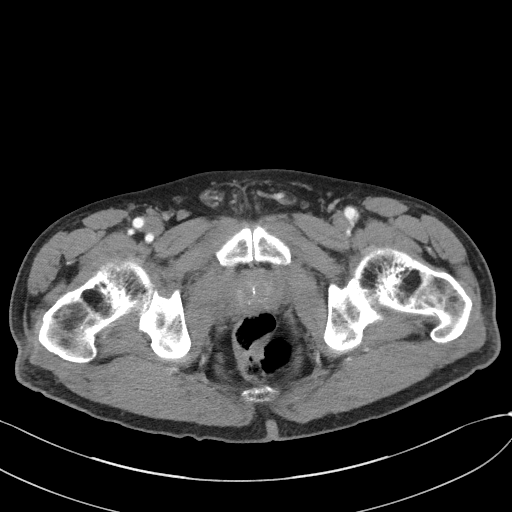
[im 18/88  soft-tissue]
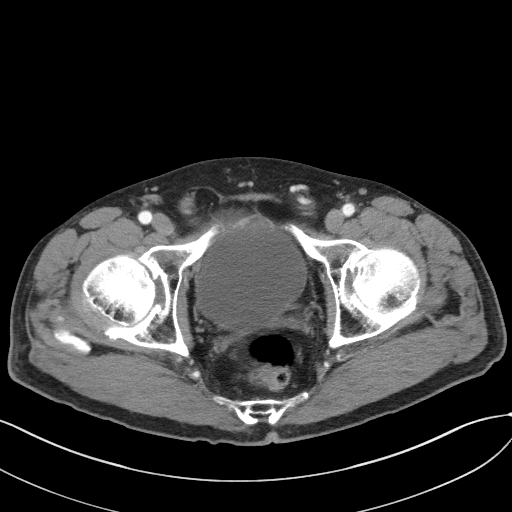
[im 30/88  soft-tissue]
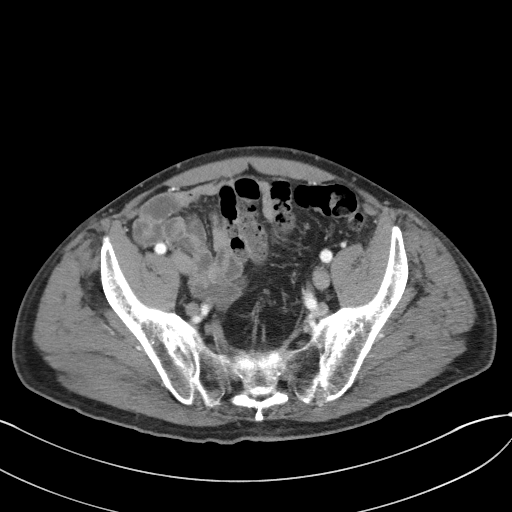
[im 35/88  soft-tissue]
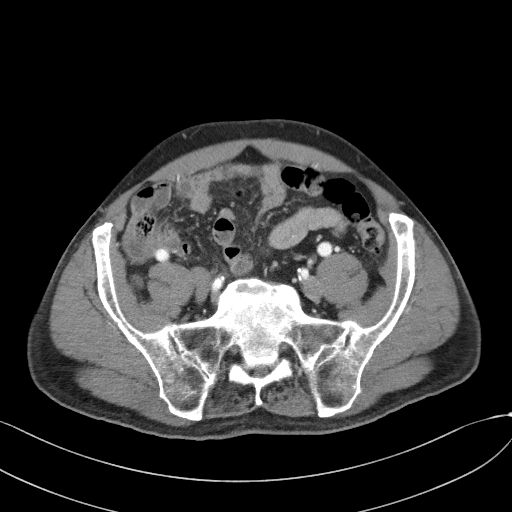
[im 41/88  soft-tissue]
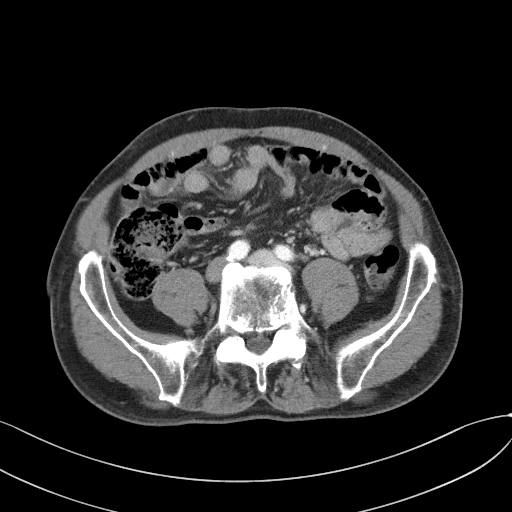
[im 47/88  soft-tissue]
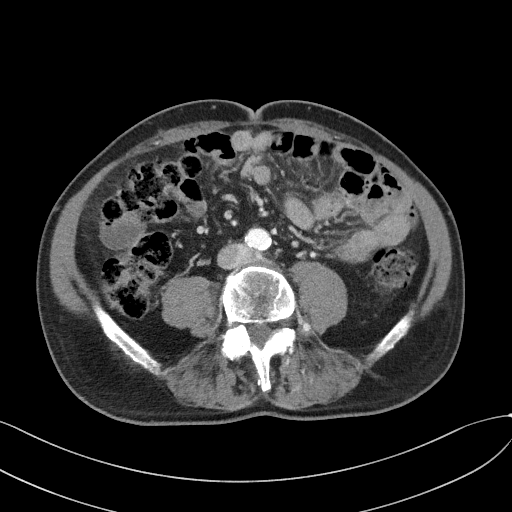
[im 53/88  soft-tissue]
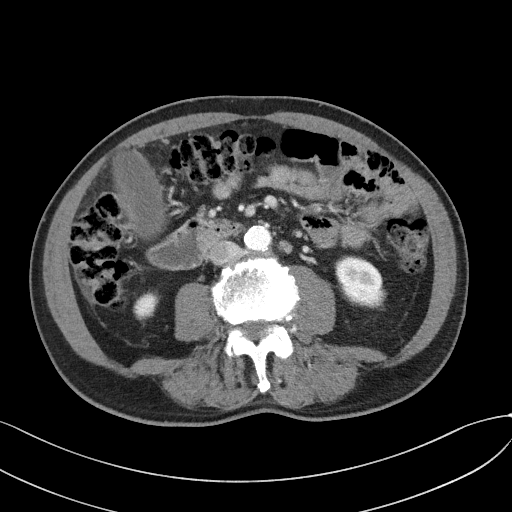
[im 59/88  soft-tissue]
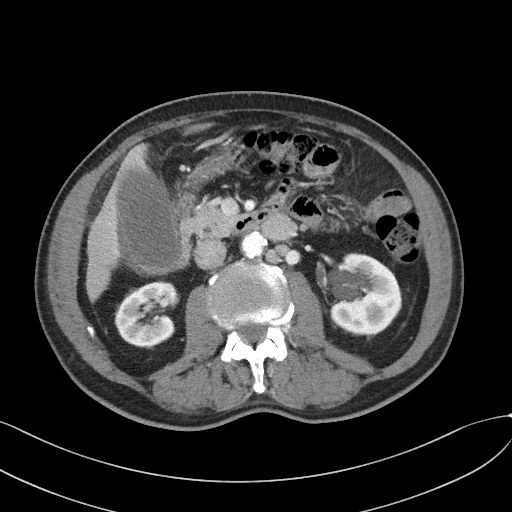
[im 59/88  bone]
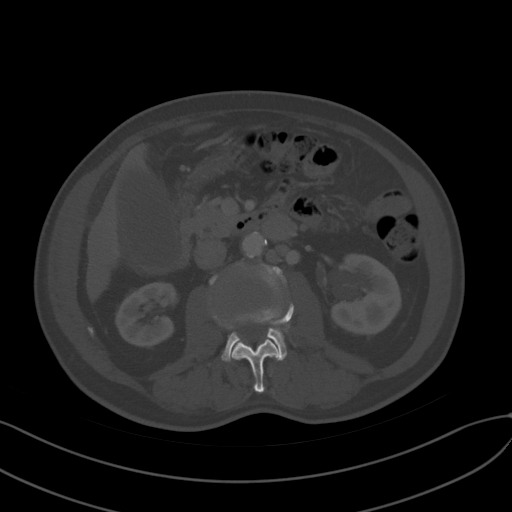
[im 70/88  soft-tissue]
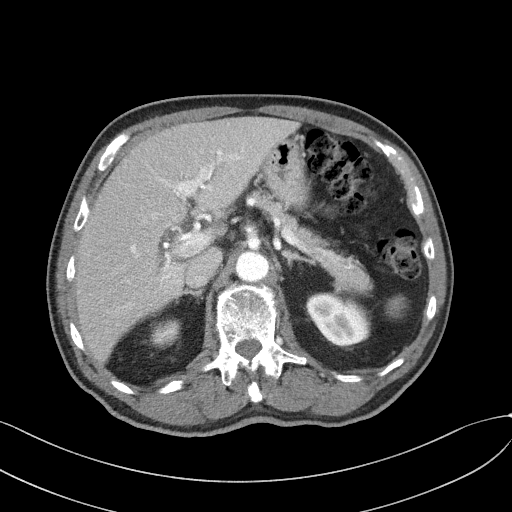
[im 76/88  soft-tissue]
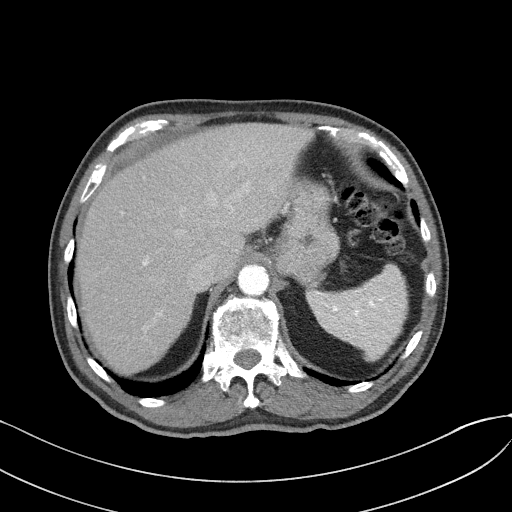
[im 82/88  soft-tissue]
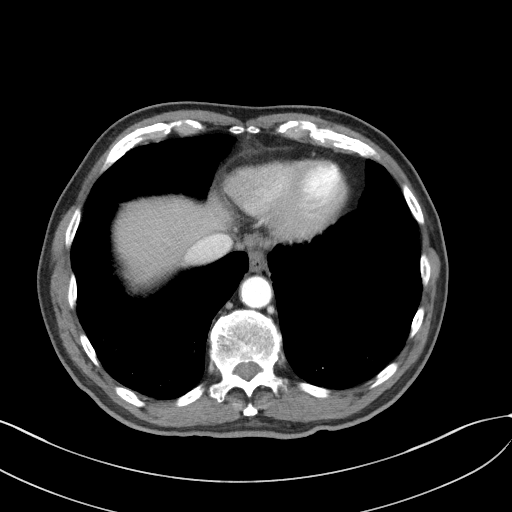

[Series 5: coronal st · coronal · 0.72mm/px · 3 of 89 slices shown]
[im 30/89  soft-tissue]
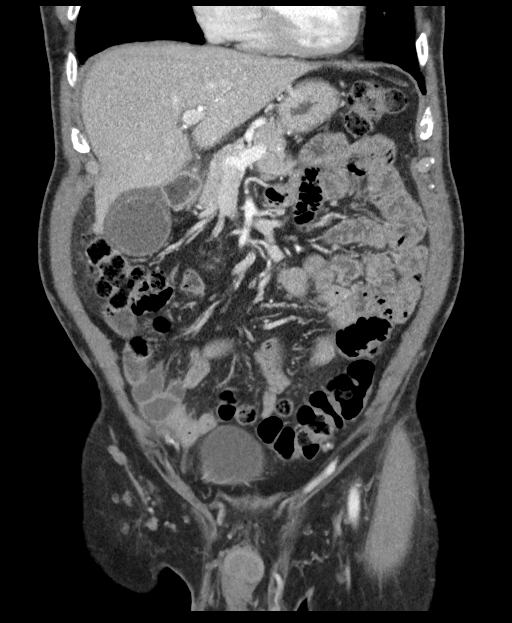
[im 40/89  soft-tissue]
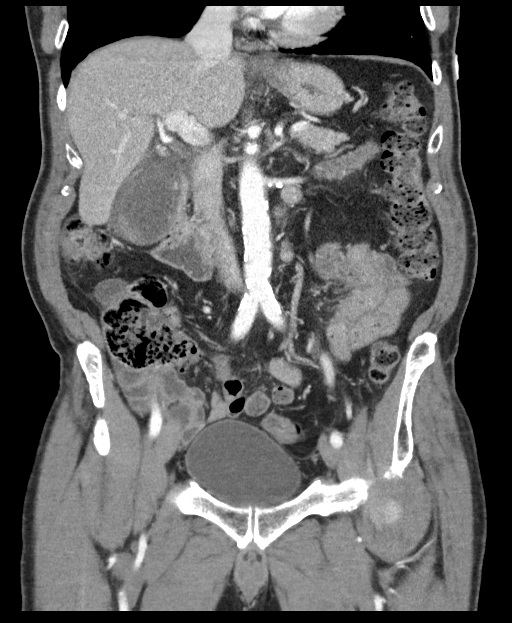
[im 49/89  soft-tissue]
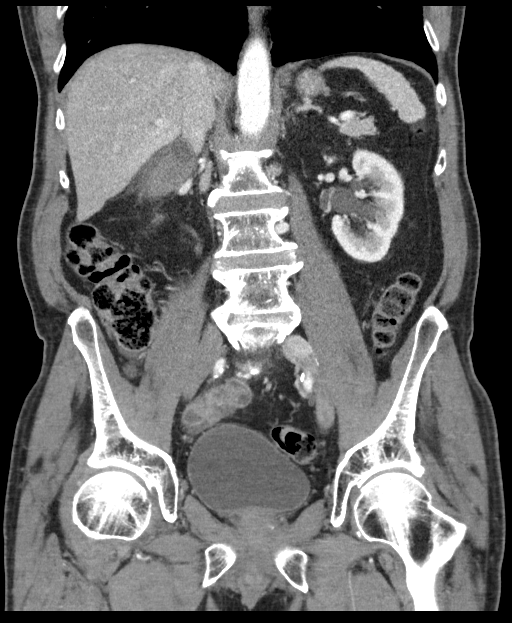

[15 of 46 positions shown; findings below may reference images not displayed]

FINDINGS: Lower chest: The visualized lung bases are clear.

No intra-abdominal free air or free fluid.

Hepatobiliary: The liver is unremarkable. There is mild intrahepatic
biliary dilatation or mild periportal edema. The gallbladder
contains sludge or noncalcified stones. There is a 4 mm stone in the
neck of the gallbladder. There is pericholecystic fluid and diffuse
gallbladder wall edema. Findings are most consistent with acute
cholecystitis. Clinical correlation is recommended.

Pancreas: Unremarkable. No pancreatic ductal dilatation or
surrounding inflammatory changes.

Spleen: Normal in size without focal abnormality.

Adrenals/Urinary Tract: The adrenal glands unremarkable. There is no
hydronephrosis on either side. There is symmetric enhancement and
excretion of contrast by both kidneys. Left renal parapelvic cysts.
The visualized ureters and urinary bladder appear unremarkable.

Stomach/Bowel: There is moderate stool throughout the colon. There
is no bowel obstruction or active inflammation. The appendix is
normal.

Vascular/Lymphatic: Mild aortoiliac atherosclerotic disease. The IVC
is unremarkable. No portal venous gas. Left
para-aortic/retroperitoneal adenopathy measuring up to 2.6 x 2.2 cm
of indeterminate etiology but concerning for malignancy/metastatic
disease.

There is a trace fluid or a mildly enlarged nodule/lymph node or
serosal implant along the inferior aspect of the cecum measuring 13
x 9 mm (coronal 50/5 and axial 53/2).

Reproductive: The prostate and seminal vesicles are grossly
unremarkable.

Other: None

Musculoskeletal: Osteopenia with degenerative changes of the spine.
No acute osseous pathology.
IMPRESSION: 1. Cholelithiasis with findings most consistent with acute
cholecystitis. Clinical correlation is recommended.
2. Left para-aortic/retroperitoneal adenopathy of indeterminate
etiology but concerning for malignancy/metastatic disease.
3. Moderate colonic stool burden. No bowel obstruction. Normal
appendix.
4. Aortic Atherosclerosis ([GQ]-[GQ]).

## 2020-12-05 IMAGING — US US ABDOMEN LIMITED
1 series · 14 of 25 positions shown · non-contrast
Comparison: None.

CLINICAL DATA: Right upper quadrant pain

EXAM:
ULTRASOUND ABDOMEN LIMITED RIGHT UPPER QUADRANT

[Series 1: us abdomen limited · 14 of 86 slices shown]
[im 1/86]
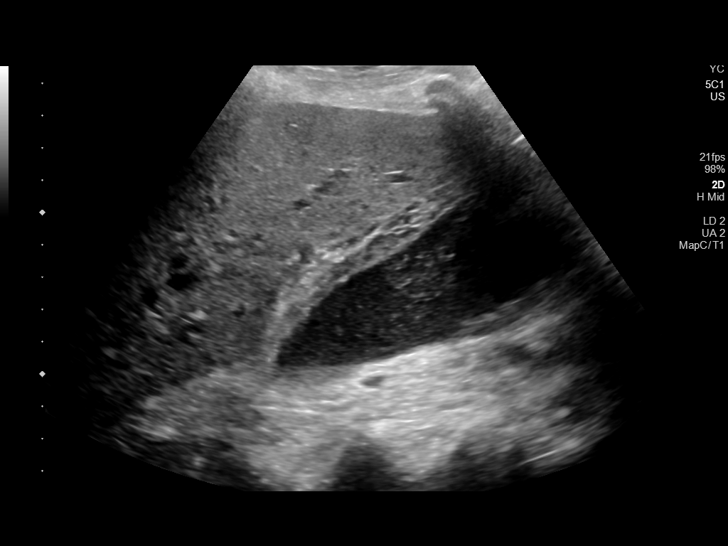
[im 8/86]
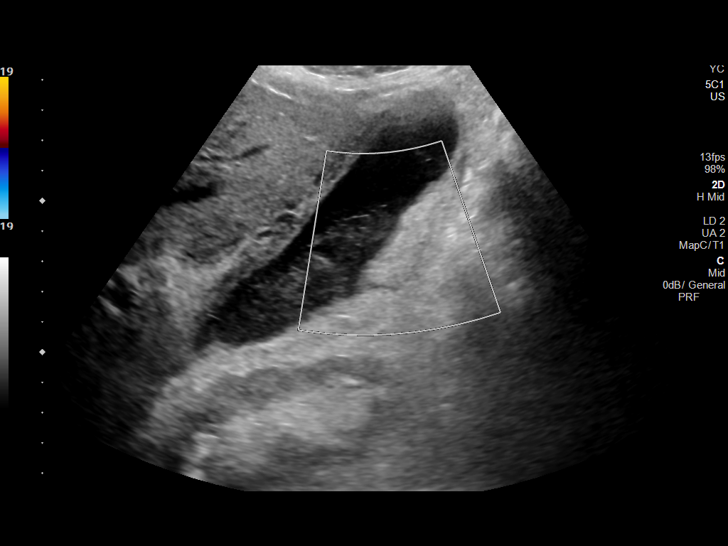
[im 15/86]
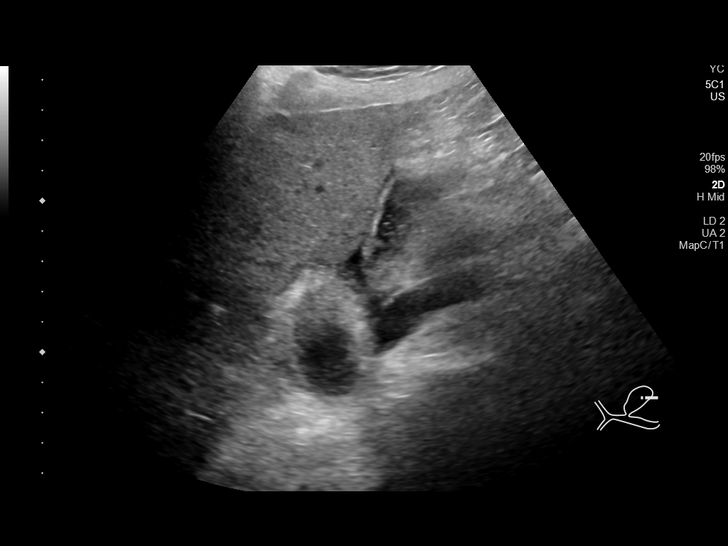
[im 22/86]
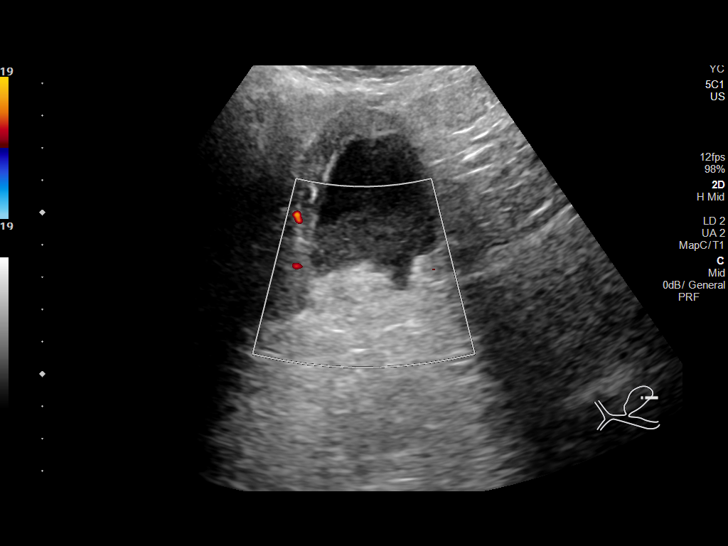
[im 29/86]
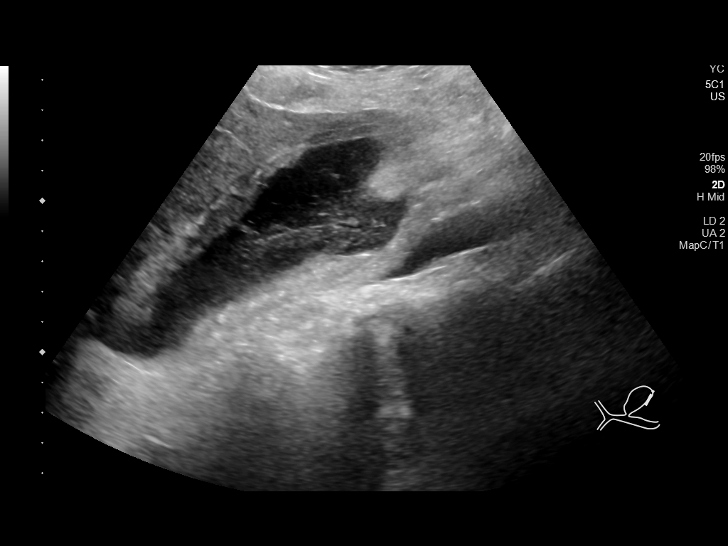
[im 32/86]
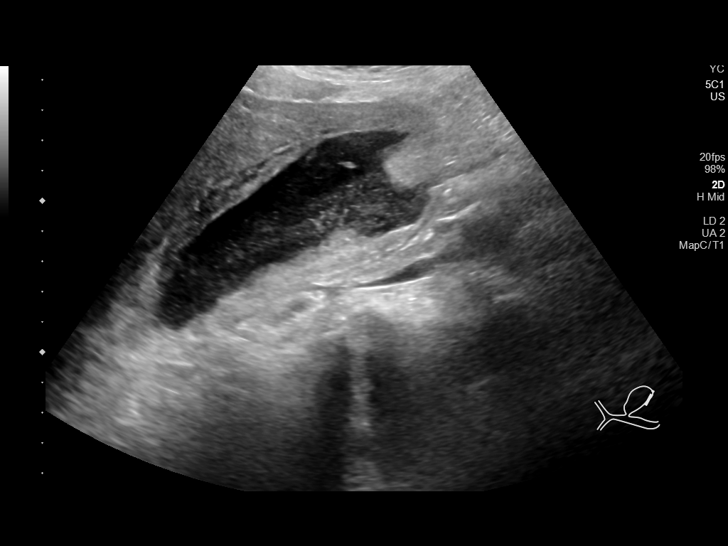
[im 39/86]
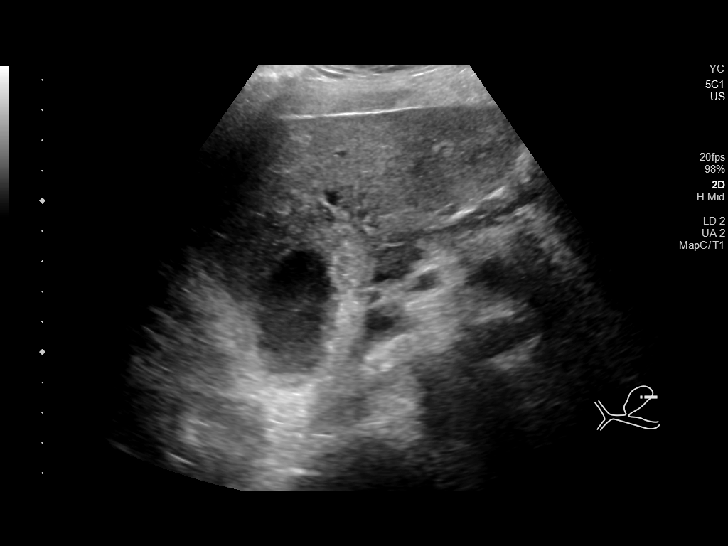
[im 47/86]
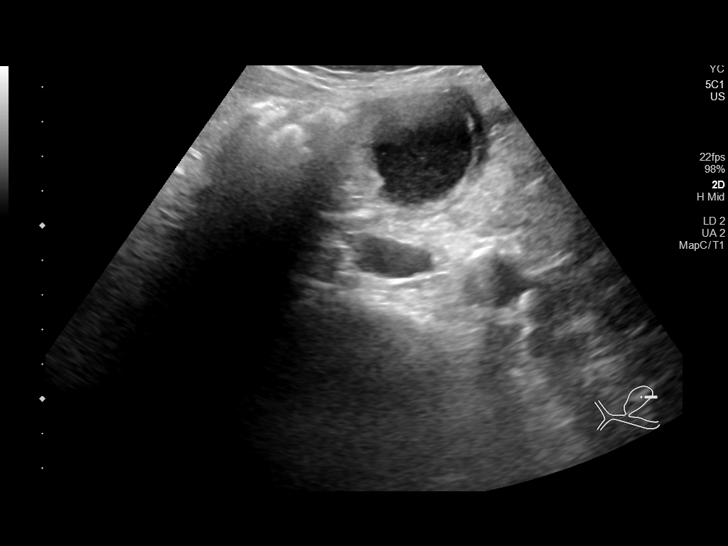
[im 54/86]
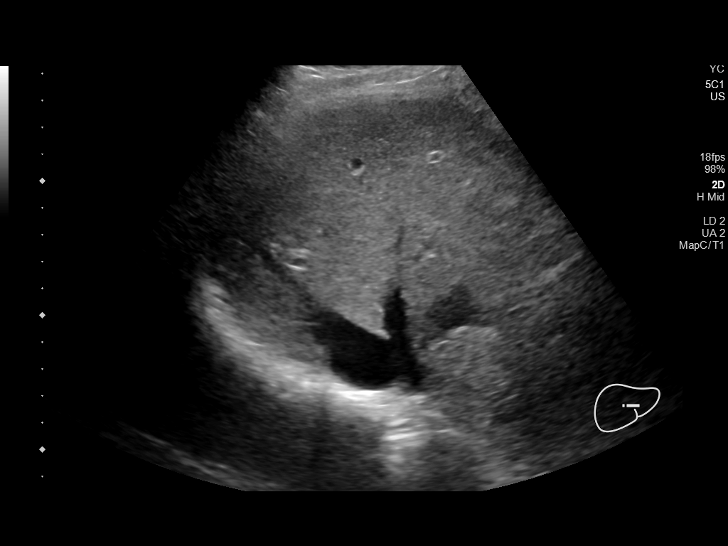
[im 57/86]
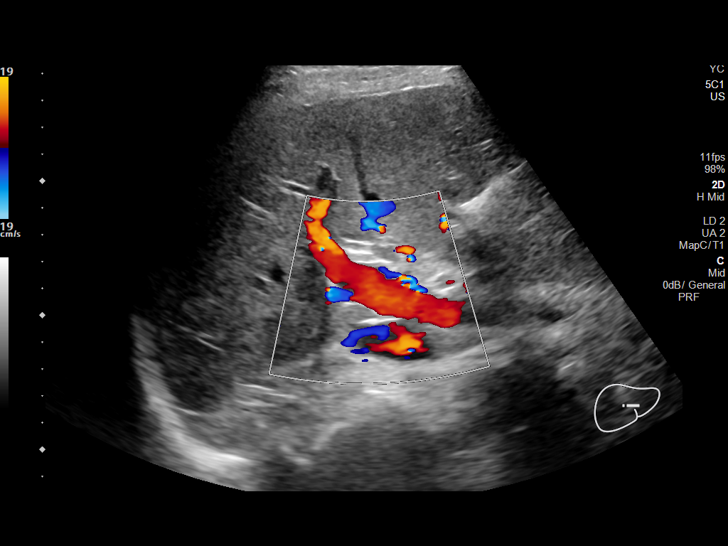
[im 64/86]
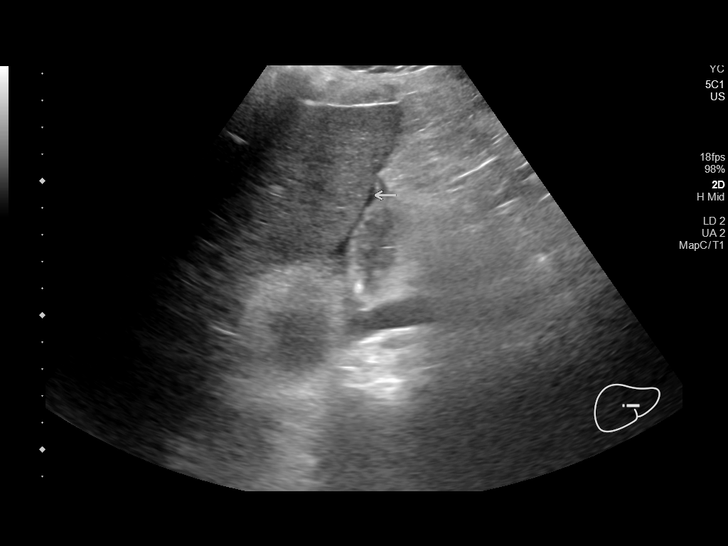
[im 71/86]
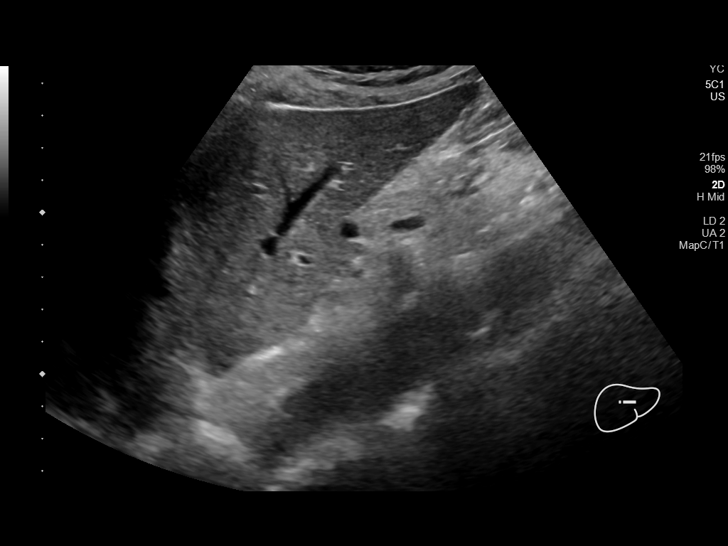
[im 78/86]
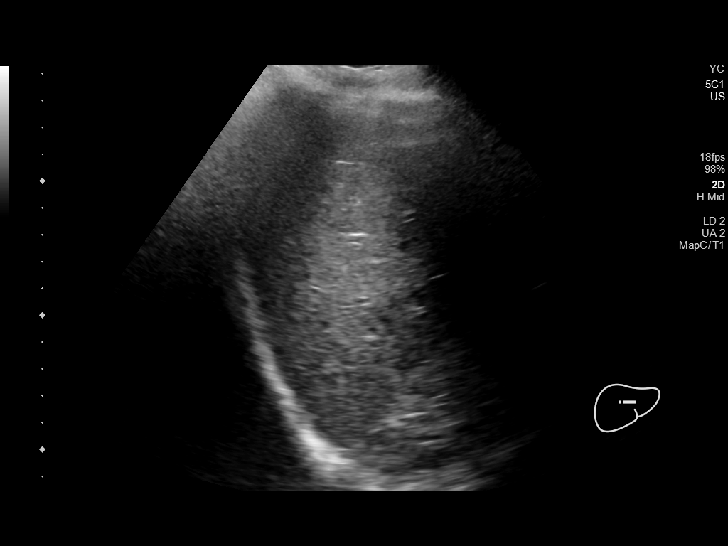
[im 86/86]
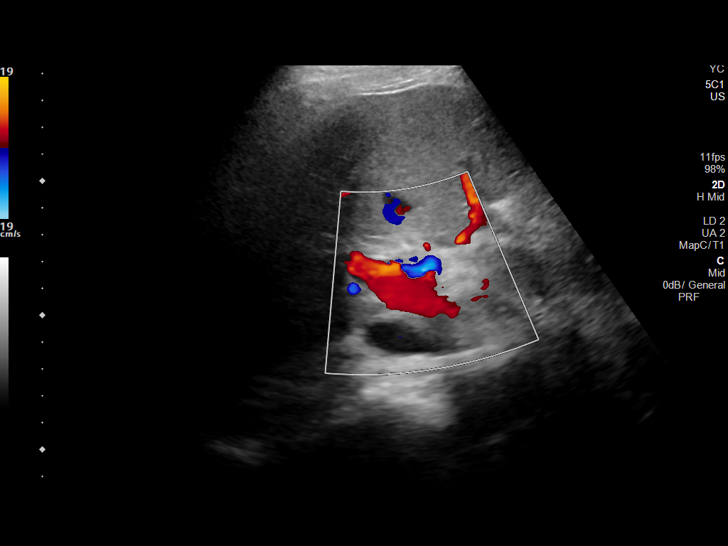

[14 of 25 positions shown; findings below may reference images not displayed]

FINDINGS: Gallbladder:

No gallstones. Sludge is present. Wall thickening measuring up to 1
cm, some of which is masslike in appearance. Pericholecystic fluid
is present. No sonographic Murphy sign noted by sonographer.

Common bile duct:

Diameter: 5 mm, normal

Liver:

No focal lesion identified. Within normal limits in parenchymal
echogenicity. Mild intrahepatic duct dilatation. Portal vein is
patent on color Doppler imaging with normal direction of blood flow
towards the liver.

Other: None.
IMPRESSION: Abnormal appearance of gallbladder raising possibility of acute
cholecystitis. However, there is no sonographic Murphy sign and some
of the wall thickening is masslike in appearance. Recommend contrast
enhanced CT or MRI for further evaluation.

Mild intrahepatic duct dilatation.

## 2020-12-05 MED ORDER — IOHEXOL 300 MG/ML  SOLN
100.0000 mL | Freq: Once | INTRAMUSCULAR | Status: AC | PRN
  Administered 2020-12-05: 22:00:00 100 mL via INTRAVENOUS

## 2020-12-05 MED ORDER — PIPERACILLIN-TAZOBACTAM 3.375 G IVPB 30 MIN
3.3750 g | Freq: Once | INTRAVENOUS | Status: AC
  Administered 2020-12-05: 18:00:00 3.375 g via INTRAVENOUS
  Filled 2020-12-05: qty 50

## 2020-12-05 MED ORDER — PIPERACILLIN-TAZOBACTAM 3.375 G IVPB
3.3750 g | Freq: Three times a day (TID) | INTRAVENOUS | Status: DC
  Administered 2020-12-06 – 2020-12-08 (×8): 3.375 g via INTRAVENOUS
  Filled 2020-12-05 (×7): qty 50

## 2020-12-05 MED ORDER — OXYCODONE HCL 5 MG PO TABS: 5.0000 mg | ORAL_TABLET | ORAL | Status: DC | PRN

## 2020-12-05 MED ORDER — ENOXAPARIN SODIUM 40 MG/0.4ML ~~LOC~~ SOLN
40.0000 mg | SUBCUTANEOUS | Status: DC
  Administered 2020-12-05: 20:00:00 40 mg via SUBCUTANEOUS
  Filled 2020-12-05: qty 0.4

## 2020-12-05 MED ORDER — POTASSIUM CHLORIDE IN NACL 40-0.9 MEQ/L-% IV SOLN
INTRAVENOUS | Status: DC
  Filled 2020-12-05 (×6): qty 1000

## 2020-12-05 NOTE — Progress Notes (Signed)
Pharmacy Antibiotic Note  Arthur Guzman is a 74 y.o. male admitted on 12/05/2020 with IAI.  Pharmacy has been consulted for Zosyn dosing.  Plan: Zosyn 3.375g IV q8 (extended interval infusion) Will sign off     Temp (24hrs), Avg:98.1 F (36.7 C), Min:98.1 F (36.7 C), Max:98.1 F (36.7 C)  Recent Labs  Lab 12/05/20 1720  WBC 12.7*  CREATININE 0.84    CrCl cannot be calculated (Unknown ideal weight.).    Allergies  Allergen Reactions  . Antihistamines, Chlorpheniramine-Type Other (See Comments)    Antihistamines make me fidgety.     Thank you for allowing pharmacy to be a part of this patient's care.  Kara Mead 12/05/2020 6:20 PM

## 2020-12-05 NOTE — ED Triage Notes (Signed)
Patient c/o abdominal pain since last night. Reports sent by PCP for surgical consult after RUQ [place].

## 2020-12-05 NOTE — H&P (Signed)
History and Physical    Arthur Guzman ACZ:[id number] DOB: 1946/12/05 DOA: 12/05/2020  PCP: Nickola Major, MD Patient coming from: Home  Chief Complaint: Abdominal pain  HPI: Arthur Guzman is a 74 y.o. male with medical history significant of hypertension, hyperlipidemia sent in from the PCPs office with abnormal CT findings of cholecystitis line lymphadenopathy.  The CT was done as an outpatient without contrast. Patient complains of right upper quadrant pain that started last night no associated fever chills nausea vomiting diarrhea.  No weight loss.  He has good appetite. Denies chest pain shortness of breath cough headaches. CT abdomen done as an outpatient today prior to admission is in care everywhere.  Findings are concerning for acute cholecystitis, intraluminal stones and question of blood products.  Underlying mass not excluded by unenhanced CT.  Left retroperitoneal lymphadenopathy concerning for metastatic disease or lymphoma.    ED Course: Received Zosyn.  Abdominal ultrasound done in ER results are pending. Review of Systems: As per HPI otherwise all other systems reviewed and are negative  Ambulatory Status: Ambulatory at baseline lives at home with his family.  Past Medical History:  Diagnosis Date  . HTN (hypertension)   . Hyperlipidemia   . Migraine     Past Surgical History:  Procedure Laterality Date  . CATARACT EXTRACTION Right 2017  . [place] SURGERY     1995  . PATELLA FRACTURE SURGERY Left     Social History   Socioeconomic History  . Marital status: Married    Spouse name: Not on file  . Number of children: 2  . Years of education: Not on file  . Highest education level: Not on file  Occupational History  . Occupation: Dealer  Tobacco Use  . Smoking status: Never Smoker  . Smokeless tobacco: Never Used  Vaping Use  . Vaping Use: Never used  Substance and Sexual Activity  . Alcohol use: No  . Drug use: No  . Sexual  activity: Not on file  Other Topics Concern  . Not on file  Social History Narrative  . Not on file   Social Determinants of Health   Financial Resource Strain: Not on file  Food Insecurity: Not on file  Transportation Needs: Not on file  Physical Activity: Not on file  Stress: Not on file  Social Connections: Not on file  Intimate Partner Violence: Not on file    Allergies  Allergen Reactions  . Antihistamines, Chlorpheniramine-Type Other (See Comments)    Antihistamines make me fidgety.    Family History  Problem Relation Age of Onset  . Cancer Mother        type unknown  . Heart disease Mother   . Stroke Brother     Prior to Admission medications   Medication Sig Start Date End Date Taking? Authorizing Provider  aspirin EC 81 MG EC tablet Take 1 tablet (81 mg total) by mouth daily. 08/27/19   Black, Lezlie Octave, NP  atorvastatin (LIPITOR) 40 MG tablet Take 40 mg by mouth daily. 01/29/20   [provider]    Physical Exam: Vitals:   12/05/20 1700 12/05/20 1730 12/05/20 1800  BP: (!) 161/84 (!) 149/80 (!) 150/87  Pulse: 80 70 67  Resp: 18 19 20   Temp: 98.1 F (36.7 C)    SpO2: 96% 97% 99%     . General:  Appears in mild distress due to right upper quadrant pain . Eyes:  PERRL, EOMI, normal lids, iris . ENT:  grossly normal hearing, lips & tongue, mmm . Neck:  no LAD, masses or thyromegaly . Cardiovascular:  RRR, no m/r/g. No LE edema.  Marland Kitchen Respiratory: CTA bilaterally, no w/r/r. Normal respiratory effort. . Abdomen: soft, nondistended, NABS, right upper quadrant tenderness . Skin: no rash or induration seen on limited exam . Musculoskeletal:  grossly normal tone BUE/BLE, good ROM, no bony abnormality . Psychiatric:  grossly normal mood and affect, speech fluent and appropriate, AOx3 . Neurologic: CN 2-12 grossly intact, moves all extremities in coordinated fashion, sensation intact  Labs on Admission: I have personally reviewed following labs and imaging  studies  CBC: Recent Labs  Lab 12/05/20 1720  WBC 12.7*  NEUTROABS 8.8*  HGB 13.6  HCT 39.2  MCV 91.6  PLT 132   Basic Metabolic Panel: Recent Labs  Lab 12/05/20 1720  NA 137  K 3.4*  CL 101  CO2 27  GLUCOSE 103*  BUN 14  CREATININE 0.84  CALCIUM 9.0   GFR: CrCl cannot be calculated (Unknown ideal weight.). Liver Function Tests: Recent Labs  Lab 12/05/20 1720  AST 24  ALT 23  ALKPHOS 89  BILITOT 0.7  PROT 7.2  ALBUMIN 3.8   Recent Labs  Lab 12/05/20 1720  LIPASE 27   No results for input(s): AMMONIA in the last 168 hours. Coagulation Profile: No results for input(s): INR, PROTIME in the last 168 hours. Cardiac Enzymes: No results for input(s): CKTOTAL, CKMB, CKMBINDEX, TROPONINI in the last 168 hours. BNP (last 3 results) No results for input(s): PROBNP in the last 8760 hours. HbA1C: No results for input(s): HGBA1C in the last 72 hours. CBG: No results for input(s): GLUCAP in the last 168 hours. Lipid Profile: No results for input(s): CHOL, HDL, LDLCALC, TRIG, CHOLHDL, LDLDIRECT in the last 72 hours. Thyroid Function Tests: No results for input(s): TSH, T4TOTAL, FREET4, T3FREE, THYROIDAB in the last 72 hours. Anemia Panel: No results for input(s): VITAMINB12, FOLATE, FERRITIN, TIBC, IRON, RETICCTPCT in the last 72 hours. Urine analysis:    Component Value Date/Time   COLORURINE YELLOW 07/05/2019 0102   APPEARANCEUR CLEAR 07/05/2019 0102   LABSPEC 1.013 07/05/2019 0102   PHURINE 8.0 07/05/2019 0102   GLUCOSEU NEGATIVE 07/05/2019 0102   HGBUR NEGATIVE 07/05/2019 0102   BILIRUBINUR NEGATIVE 07/05/2019 0102   KETONESUR NEGATIVE 07/05/2019 0102   PROTEINUR NEGATIVE 07/05/2019 0102   NITRITE NEGATIVE 07/05/2019 0102   LEUKOCYTESUR NEGATIVE 07/05/2019 0102    Creatinine Clearance: CrCl cannot be calculated (Unknown ideal weight.).  Sepsis Labs: @LABRCNTIP (procalcitonin:4,lacticidven:4) )No results found for this or any previous visit (from  the past 240 hour(s)).   Radiological Exams on Admission: No results found.    Assessment/Plan Active Problems:   Cholecystitis with cholelithiasis   #1 acute cholecystitis with cholelithiasis patient admitted with abdominal pain.  CT without contrast concerning for cholecystitis with lymphadenopathy and retroperitoneal lymph nodes concerning for metastatic disease versus lymphoma.  Ultrasound of the abdomen done this evening results are pending.  I will get CT of the abdomen with contrast to get a better view. N.p.o. IV fluids Zosyn Surgery consulted from the ED  #2 hypokalemia replete potassium 3.4 check magnesium level  #3 leukocytosis likely secondary to #1 continue antibiotics and IV fluids  #4 history of hypertension he is not on any medications at home.  #5 history of hyperlipidemia on statin at home   Estimated body mass index is 24.07 kg/m as calculated from the following:   Height as of 03/21/20: 5\' 9"  (1.753 m).  Weight as of 03/21/20: 73.9 kg.   DVT prophylaxis: Lovenox Code Status: Full code Family Communication: Discussed with wife at bedside Disposition [place] called: ED physician called consulted general surgery Admission status observation   Georgette Shell MD [facility]  If 7PM-7AM, please contact night-coverage [email or web address] Password [facility]  12/05/2020, 6:19 PM

## 2020-12-05 NOTE — ED Provider Notes (Signed)
[facility] DEPT Provider Note   CSN: [id number] Arrival date & time: 12/05/20  1645     History Chief Complaint  Patient presents with   Abdominal Pain    Arthur Guzman is a 74 y.o. male.  HPI      74yo male with history of hypertension, hyperlipidemia presents with concern for RUQ abdominal pain with CT findings on outpatient CT concerning for cholecystitis.    Reports pain is aching, fullness in epigastrium and RUQ.  Began last night after eating and worsened through the night. Was severe last night, could not sleep or get comfortable. Has improved now but is still constant. No nausea/vomiting/fever/diarrhea/constipaiton or urinary symptoms.     CT shows findings concerning for acute cholecystitis. Intraluminal stones and question blood products. Underlying mass not excluded by unenhanced CT.  Left retroperitoneal lymphadenopathy concerning for metastatic disease or lymphoma.  Labs show Cr .94, normal AST, ALT, Bilirubin, alk phos.    Past Medical History:  Diagnosis Date   HTN (hypertension)    Hyperlipidemia    Migraine     Patient Active Problem List   Diagnosis Date Noted   Cholecystitis with cholelithiasis 12/05/2020   Hypokalemia 12/05/2020   Leukocytosis 12/05/2020   RUQ abdominal pain    Asymptomatic stenosis of right carotid artery 09/21/2019   Essential hypertension 09/21/2019   Migraine 08/27/2019   TIA (transient ischemic attack) 08/25/2019   Elevated blood pressure reading 08/25/2019    Past Surgical History:  Procedure Laterality Date   CATARACT EXTRACTION Right 2017   LUMBAR DISC SURGERY     1995   PATELLA FRACTURE SURGERY Left        Family History  Problem Relation Age of Onset   Cancer Mother        type unknown   Heart disease Mother    Stroke Brother     Social History   Tobacco Use   Smoking status: Never Smoker   Smokeless tobacco: Never Used  Vaping Use   Vaping  Use: Never used  Substance Use Topics   Alcohol use: No   Drug use: No    Home Medications Prior to Admission medications   Medication Sig Start Date End Date Taking? Authorizing Provider  aspirin EC 81 MG EC tablet Take 1 tablet (81 mg total) by mouth daily. 08/27/19  Yes Black, Lezlie Octave, NP  atorvastatin (LIPITOR) 40 MG tablet Take 40 mg by mouth daily. 01/29/20  Yes [provider]  B Complex Vitamins (VITAMIN B COMPLEX PO) Take 1 tablet by mouth daily.   Yes [provider]  traMADol (ULTRAM) 50 MG tablet Take 50 mg by mouth 2 (two) times daily as needed for severe pain. 12/05/20   [provider]    Allergies    Antihistamines, chlorpheniramine-type  Review of Systems   Review of Systems  Constitutional: Negative for fever.  HENT: Negative for sore throat.   Eyes: Negative for visual disturbance.  Respiratory: Negative for shortness of breath.   Cardiovascular: Negative for chest pain.  Gastrointestinal: Positive for abdominal pain. Negative for constipation, diarrhea, nausea and vomiting.  Genitourinary: Negative for difficulty urinating.  Musculoskeletal: Negative for back pain and neck stiffness.  Skin: Negative for rash.  Neurological: Negative for syncope and headaches.    Physical Exam Updated Vital Signs BP 120/70 (BP Location: Right Arm)    Pulse 65    Temp 98.7 F (37.1 C) (Oral)    Resp 18    Ht  _0  (1.753 m)    Wt 76.7 kg    SpO2 98%    BMI 24.97 kg/m   Physical Exam Vitals and nursing note reviewed.  Constitutional:      General: He is not in acute distress.    Appearance: He is well-developed and well-nourished. He is not diaphoretic.  HENT:     Head: Normocephalic and atraumatic.  Eyes:     Extraocular Movements: EOM normal.     Conjunctiva/sclera: Conjunctivae normal.  Cardiovascular:     Rate and Rhythm: Normal rate and regular rhythm.     Pulses: Intact distal pulses.     Heart sounds: Normal heart sounds. No murmur  heard. No friction rub. No gallop.   Pulmonary:     Effort: Pulmonary effort is normal. No respiratory distress.     Breath sounds: Normal breath sounds. No wheezing or rales.  Abdominal:     General: There is no distension.     Palpations: Abdomen is soft.     Tenderness: There is abdominal tenderness in the right upper quadrant and epigastric area. There is no right CVA tenderness or guarding. Negative signs include Murphy's sign.  Musculoskeletal:        General: No edema.     Cervical back: Normal range of motion.  Skin:    General: Skin is warm and dry.  Neurological:     Mental Status: He is alert and oriented to person, place, and time.     ED Results / Procedures / Treatments   Labs (all labs ordered are listed, but only abnormal results are displayed) Labs Reviewed  CBC WITH DIFFERENTIAL/PLATELET - Abnormal; Notable for the following components:      Result Value   WBC 12.7 (*)    Neutro Abs 8.8 (*)    Monocytes Absolute 1.4 (*)    All other components within normal limits  COMPREHENSIVE METABOLIC PANEL - Abnormal; Notable for the following components:   Potassium 3.4 (*)    Glucose, Bld 103 (*)    All other components within normal limits  CBC - Abnormal; Notable for the following components:   RBC 3.75 (*)    Hemoglobin 11.8 (*)    HCT 34.8 (*)    All other components within normal limits  COMPREHENSIVE METABOLIC PANEL - Abnormal; Notable for the following components:   Calcium 8.4 (*)    Total Protein 6.2 (*)    Albumin 3.2 (*)    Anion gap 3 (*)    All other components within normal limits  MAGNESIUM - Abnormal; Notable for the following components:   Magnesium 1.6 (*)    All other components within normal limits  RESP PANEL BY RT-PCR (FLU A&B, COVID) ARPGX2  LIPASE, BLOOD  LACTIC ACID, PLASMA  LACTIC ACID, PLASMA    EKG None  Radiology CT ABDOMEN PELVIS W CONTRAST  Result Date: 12/05/2020 CLINICAL DATA:  74 year old male with abdominal pain.  EXAM: CT ABDOMEN AND PELVIS WITH CONTRAST TECHNIQUE: Multidetector CT imaging of the abdomen and pelvis was performed using the standard protocol following bolus administration of intravenous contrast. CONTRAST:  140m OMNIPAQUE IOHEXOL 300 MG/ML  SOLN COMPARISON:  Abdominal ultrasound dated 12/05/2020. FINDINGS: Lower chest: The visualized lung bases are clear. No intra-abdominal free air or free fluid. Hepatobiliary: The liver is unremarkable. There is mild intrahepatic biliary dilatation or mild periportal edema. The gallbladder contains sludge or noncalcified stones. There is a 4 mm stone in the neck of the gallbladder. There is pericholecystic  fluid and diffuse gallbladder wall edema. Findings are most consistent with acute cholecystitis. Clinical correlation is recommended. Pancreas: Unremarkable. No pancreatic ductal dilatation or surrounding inflammatory changes. Spleen: Normal in size without focal abnormality. Adrenals/Urinary Tract: The adrenal glands unremarkable. There is no hydronephrosis on either side. There is symmetric enhancement and excretion of contrast by both kidneys. Left renal parapelvic cysts. The visualized ureters and urinary bladder appear unremarkable. Stomach/Bowel: There is moderate stool throughout the colon. There is no bowel obstruction or active inflammation. The appendix is normal. Vascular/Lymphatic: Mild aortoiliac atherosclerotic disease. The IVC is unremarkable. No portal venous gas. Left para-aortic/retroperitoneal adenopathy measuring up to 2.6 x 2.2 cm of indeterminate etiology but concerning for malignancy/metastatic disease. There is a trace fluid or a mildly enlarged nodule/lymph node or serosal implant along the inferior aspect of the cecum measuring 13 x 9 mm (coronal 50/5 and axial 53/2). Reproductive: The prostate and seminal vesicles are grossly unremarkable. Other: None Musculoskeletal: Osteopenia with degenerative changes of the spine. No acute osseous pathology.  IMPRESSION: 1. Cholelithiasis with findings most consistent with acute cholecystitis. Clinical correlation is recommended. 2. Left para-aortic/retroperitoneal adenopathy of indeterminate etiology but concerning for malignancy/metastatic disease. 3. Moderate colonic stool burden. No bowel obstruction. Normal appendix. 4. Aortic Atherosclerosis (ICD10-I70.0). Electronically Signed   By: Anner Crete M.D.   On: 12/05/2020 22:52   US Abdomen Limited RUQ (LIVER/GB)  Result Date: 12/05/2020 CLINICAL DATA:  Right upper quadrant pain EXAM: ULTRASOUND ABDOMEN LIMITED RIGHT UPPER QUADRANT COMPARISON:  None. FINDINGS: Gallbladder: No gallstones. Sludge is present. Wall thickening measuring up to 1 cm, some of which is masslike in appearance. Pericholecystic fluid is present. No sonographic Murphy sign noted by sonographer. Common bile duct: Diameter: 5 mm, normal Liver: No focal lesion identified. Within normal limits in parenchymal echogenicity. Mild intrahepatic duct dilatation. Portal vein is patent on color Doppler imaging with normal direction of blood flow towards the liver. Other: None. IMPRESSION: Abnormal appearance of gallbladder raising possibility of acute cholecystitis. However, there is no sonographic Murphy sign and some of the wall thickening is masslike in appearance. Recommend contrast enhanced CT or MRI for further evaluation. Mild intrahepatic duct dilatation. Electronically Signed   By: Macy Mis M.D.   On: 12/05/2020 18:41    Procedures Procedures (including critical care time)  Medications Ordered in ED Medications  enoxaparin (LOVENOX) injection 40 mg (40 mg Subcutaneous Given 12/05/20 2002)  0.9 % NaCl with KCl 40 mEq / L  infusion ( Intravenous Restarted 12/06/20 1140)  piperacillin-tazobactam (ZOSYN) IVPB 3.375 g ( Intravenous Restarted 12/06/20 1138)  oxyCODONE (Oxy IR/ROXICODONE) immediate release tablet 5 mg (has no administration in time range)  piperacillin-tazobactam  (ZOSYN) IVPB 3.375 g (0 g Intravenous Stopped 12/05/20 1845)  iohexol (OMNIPAQUE) 300 MG/ML solution 100 mL (100 mLs Intravenous Contrast Given 12/05/20 2223)  gadobutrol (GADAVIST) 1 MMOL/ML injection 7 mL (7 mLs Intravenous Contrast Given 12/06/20 1111)    ED Course  I have reviewed the triage vital signs and the nursing notes.  Pertinent labs & imaging results that were available during my care of the patient were reviewed by me and considered in my medical decision making (see chart for details).    MDM Rules/Calculators/A&P                          74yo male with history of hypertension, hyperlipidemia presents with concern for RUQ abdominal pain with CT findings on outpatient noncontrast CT concerning for cholecystitis however  cannot rule out underlying mass and CT also significant for retroperitoneal lymphadenopathy.   Discussed with Dr. Harlow Asa of general surgery findings. Surgery to consult and recommends inpatient medicine admission for further evaluation of lymphadenopathy (metastatic disease vs lymphoma). Given zosyn, hospitalist consulted for admission and RUQ [place] ordered for further evaluation of GB.   Final Clinical Impression(s) / ED Diagnoses Final diagnoses:  RUQ abdominal pain  Abnormal findings on diagnostic imaging of gallbladder    Rx / DC Orders ED Discharge Orders    None       Gareth Morgan, MD 12/06/20 1146

## 2020-12-05 NOTE — Progress Notes (Signed)
A consult was received from an ED physician for Zosyn per pharmacy dosing (for an indication other than meningitis). The patient's profile has been reviewed for ht/wt/allergies/indication/available labs. A one time order has been placed for the above antibiotics.  Further antibiotics/pharmacy consults should be ordered by admitting physician if indicated.                       Reuel Boom, PharmD, BCPS [phone] 12/05/2020, 5:49 PM

## 2020-12-05 NOTE — ED Notes (Signed)
Per PCP-CT done, shows chole-notes in Care every where

## 2020-12-06 ENCOUNTER — Other Ambulatory Visit: Payer: Self-pay

## 2020-12-06 ENCOUNTER — Observation Stay (HOSPITAL_COMMUNITY): Payer: Medicare Other

## 2020-12-06 ENCOUNTER — Encounter (HOSPITAL_COMMUNITY): Payer: Self-pay | Admitting: Internal Medicine

## 2020-12-06 DIAGNOSIS — K8012 Calculus of gallbladder with acute and chronic cholecystitis without obstruction: Secondary | ICD-10-CM | POA: Diagnosis present

## 2020-12-06 DIAGNOSIS — Z7982 Long term (current) use of aspirin: Secondary | ICD-10-CM | POA: Diagnosis not present

## 2020-12-06 DIAGNOSIS — I10 Essential (primary) hypertension: Secondary | ICD-10-CM | POA: Diagnosis present

## 2020-12-06 DIAGNOSIS — Z8673 Personal history of transient ischemic attack (TIA), and cerebral infarction without residual deficits: Secondary | ICD-10-CM | POA: Diagnosis not present

## 2020-12-06 DIAGNOSIS — K828 Other specified diseases of gallbladder: Secondary | ICD-10-CM | POA: Diagnosis present

## 2020-12-06 DIAGNOSIS — E785 Hyperlipidemia, unspecified: Secondary | ICD-10-CM | POA: Diagnosis present

## 2020-12-06 DIAGNOSIS — N5089 Other specified disorders of the male genital organs: Secondary | ICD-10-CM | POA: Diagnosis present

## 2020-12-06 DIAGNOSIS — R1011 Right upper quadrant pain: Secondary | ICD-10-CM | POA: Diagnosis present

## 2020-12-06 DIAGNOSIS — E876 Hypokalemia: Secondary | ICD-10-CM | POA: Diagnosis present

## 2020-12-06 DIAGNOSIS — R590 Localized enlarged lymph nodes: Secondary | ICD-10-CM | POA: Diagnosis present

## 2020-12-06 DIAGNOSIS — Z79899 Other long term (current) drug therapy: Secondary | ICD-10-CM | POA: Diagnosis not present

## 2020-12-06 DIAGNOSIS — R591 Generalized enlarged lymph nodes: Secondary | ICD-10-CM | POA: Diagnosis not present

## 2020-12-06 DIAGNOSIS — K8000 Calculus of gallbladder with acute cholecystitis without obstruction: Secondary | ICD-10-CM | POA: Diagnosis not present

## 2020-12-06 DIAGNOSIS — Z20822 Contact with and (suspected) exposure to covid-19: Secondary | ICD-10-CM | POA: Diagnosis present

## 2020-12-06 LAB — COMPREHENSIVE METABOLIC PANEL
ALT: 19 U/L (ref 0–44)
AST: 19 U/L (ref 15–41)
Albumin: 3.2 g/dL — ABNORMAL LOW (ref 3.5–5.0)
Alkaline Phosphatase: 71 U/L (ref 38–126)
Anion gap: 3 — ABNORMAL LOW (ref 5–15)
BUN: 13 mg/dL (ref 8–23)
CO2: 29 mmol/L (ref 22–32)
Calcium: 8.4 mg/dL — ABNORMAL LOW (ref 8.9–10.3)
Chloride: 106 mmol/L (ref 98–111)
Creatinine, Ser: 0.95 mg/dL (ref 0.61–1.24)
GFR, Estimated: >60 mL/min (ref >60)
Glucose, Bld: 99 mg/dL (ref 70–99)
Potassium: 3.9 mmol/L (ref 3.5–5.1)
Sodium: 138 mmol/L (ref 135–145)
Total Bilirubin: 1.1 mg/dL (ref 0.3–1.2)
Total Protein: 6.2 g/dL — ABNORMAL LOW (ref 6.5–8.1)

## 2020-12-06 LAB — CBC
HCT: 34.8 % — ABNORMAL LOW (ref 39.0–52.0)
Hemoglobin: 11.8 g/dL — ABNORMAL LOW (ref 13.0–17.0)
MCH: 31.5 pg (ref 26.0–34.0)
MCHC: 33.9 g/dL (ref 30.0–36.0)
MCV: 92.8 fL (ref 80.0–100.0)
Platelets: 158 10*3/uL (ref 150–400)
RBC: 3.75 MIL/uL — ABNORMAL LOW (ref 4.22–5.81)
RDW: 13.3 % (ref 11.5–15.5)
WBC: 8.5 10*3/uL (ref 4.0–10.5)
nRBC: 0 % (ref 0.0–0.2)

## 2020-12-06 LAB — SURGICAL PCR SCREEN
MRSA, PCR: NEGATIVE
Staphylococcus aureus: NEGATIVE

## 2020-12-06 IMAGING — MR MR ABDOMEN WO/W CM MRCP
17 of 20 series · 39 of 48 positions shown · IV contrast (gadavist)
Comparison: CT abdomen/pelvis and right upper quadrant ultrasound
dated [DATE].

CLINICAL DATA: Suspected acute cholecystitis on CT. Left
para-aortic/retroperitoneal lymphadenopathy.

EXAM:
MRI ABDOMEN WITHOUT AND WITH CONTRAST (INCLUDING MRCP)
TECHNIQUE: Multiplanar multisequence MR imaging of the abdomen was performed
both before and after the administration of intravenous contrast.
Heavily T2-weighted images of the biliary and pancreatic ducts were
obtained, and three-dimensional MRCP images were rendered by post
processing.
CONTRAST:  7mL GADAVIST GADOBUTROL 1 MMOL/ML IV SOLN

[Series 3: T2 fat-sat · axial · 6.0mm · 1.25mm/px · 1 of 36 slices shown]
[im 1/36]
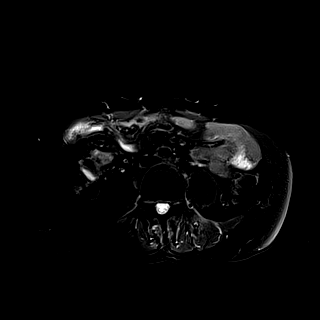

[Series 6: cor_3d_spc_trig · coronal · 1.0mm · 0.49mm/px · 3 of 88 slices shown]
[im 1/88]
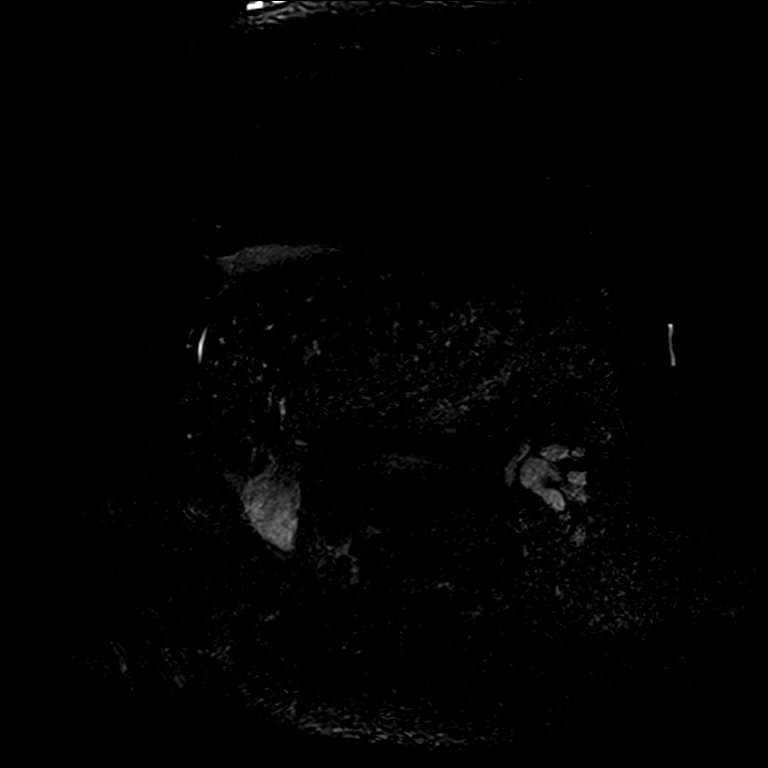
[im 44/88]
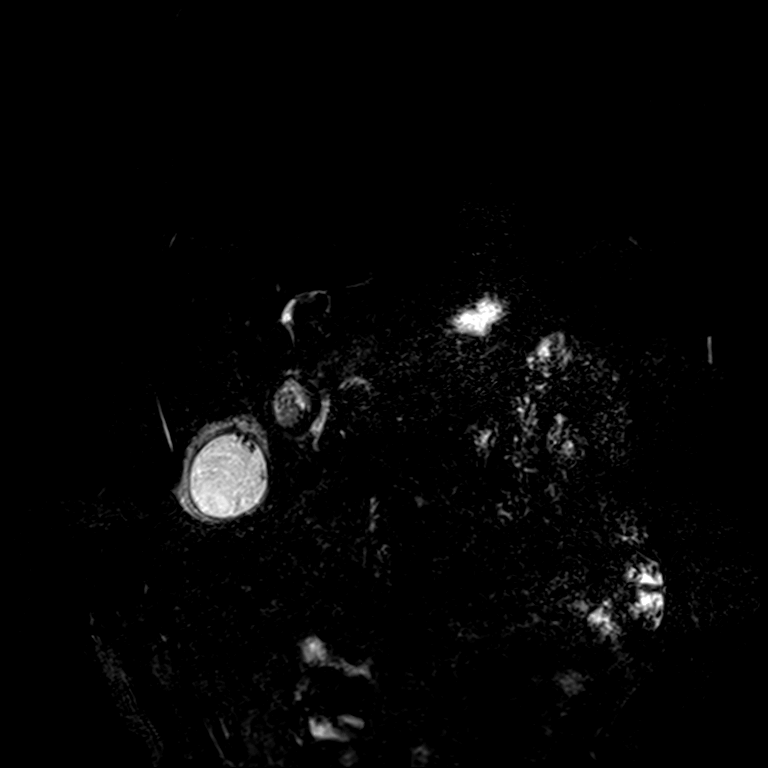
[im 88/88]
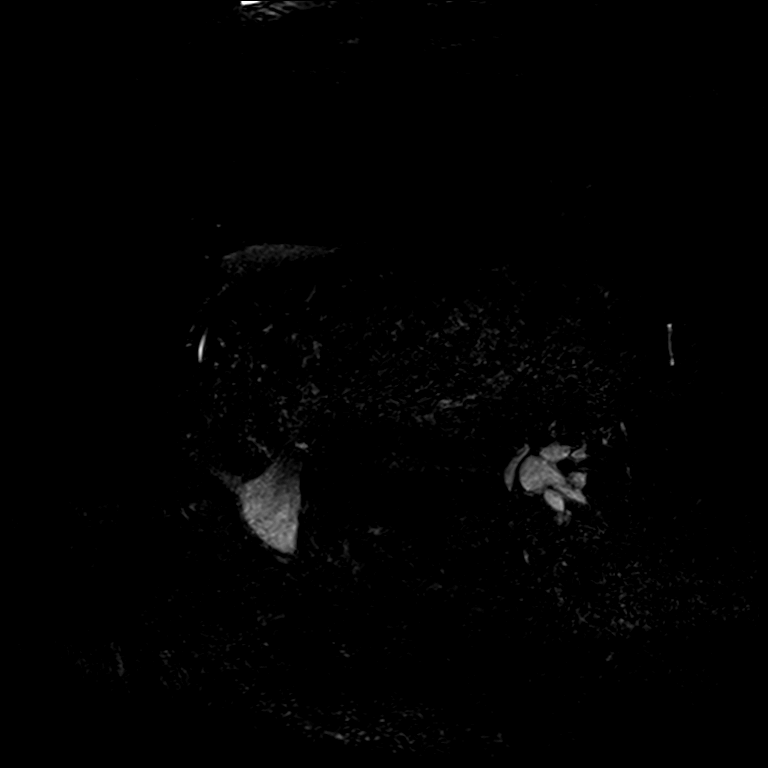

[Series 9: T2 · coronal · 6.0mm · 1.48mm/px · 1 of 32 slices shown (1 of 2)]
[im 1/32]
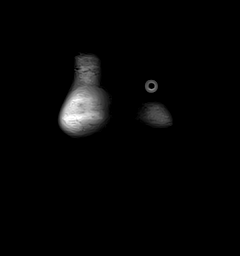

[Series 11: DWI · axial · 6.0mm · 1.49mm/px · z∈[-154,+98]mm · 3 of 72 slices shown (1 of 2)]
[im 1/72]
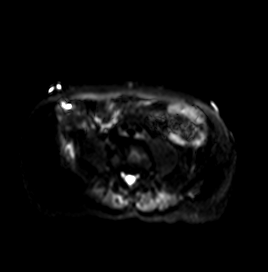
[im 36/72]
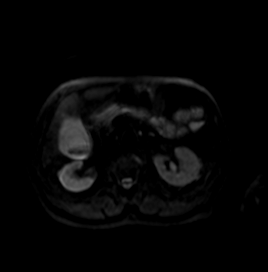
[im 72/72]
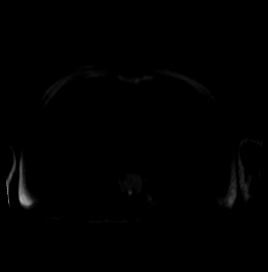

[Series 12: DWI · axial · 6.0mm · 1.49mm/px · 1 of 36 slices shown (2 of 2)]
[im 1/36]
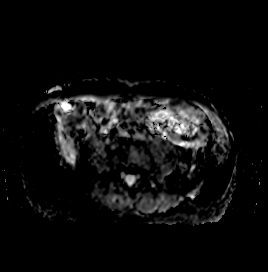

[Series 13: T1 · axial · 3.0mm · 1.25mm/px · z∈[-136,+101]mm · 3 of 80 slices shown (1 of 2)]
[im 1/80]
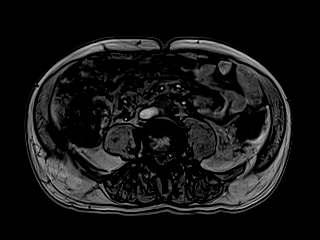
[im 40/80]
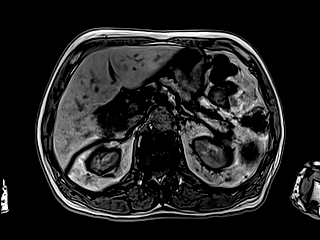
[im 80/80]
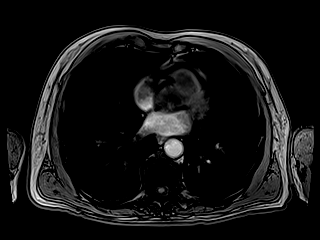

[Series 14: T1 · axial · 3.0mm · 1.25mm/px · z∈[-136,+101]mm · 3 of 80 slices shown (2 of 2)]
[im 1/80]
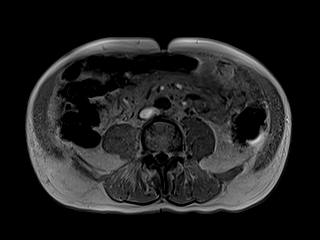
[im 40/80]
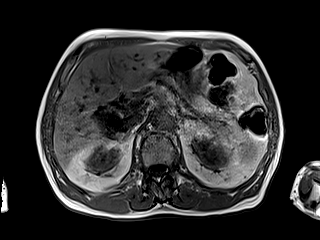
[im 80/80]
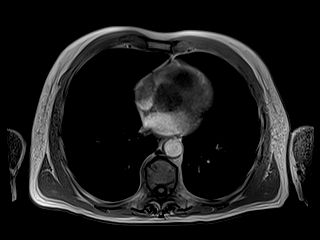

[Series 15: cor obl thk · sagittal · 50.0mm · 0.78mm/px · 1 of 9 slices shown]
[im 1/9]
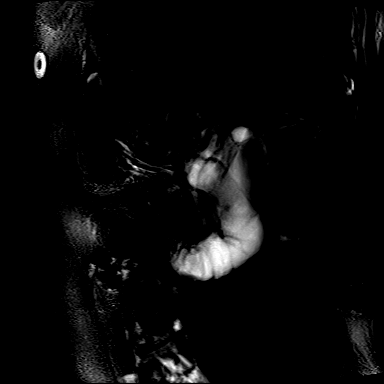

[Series 16: T2 · axial · 6.0mm · 1.56mm/px · 1 of 35 slices shown (2 of 2)]
[im 1/35]
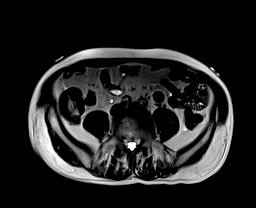

[Series 18: T1 dynamic · axial · 3.0mm · 1.25mm/px · z∈[-156,+81]mm · 3 of 80 slices shown (1 of 6)]
[im 1/80]
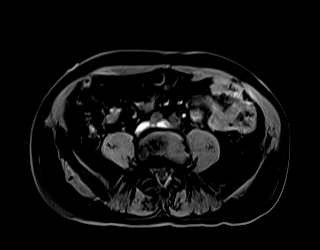
[im 40/80]
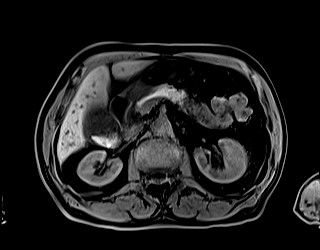
[im 80/80]
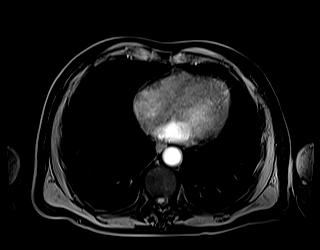

[Series 21: T1 dynamic · axial · 3.0mm · 1.25mm/px · z∈[-156,+81]mm · 3 of 80 slices shown (2 of 6)]
[im 1/80]
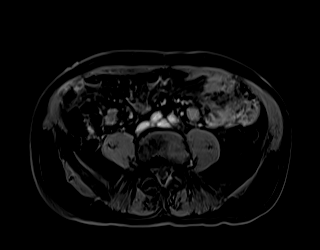
[im 40/80]
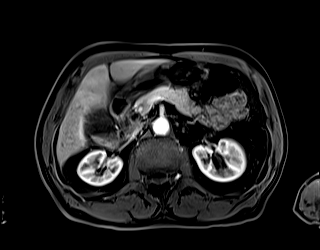
[im 80/80]
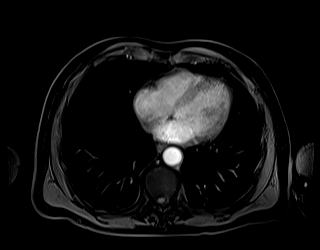

[Series 23: T1 dynamic · axial · 3.0mm · 1.25mm/px · z∈[-156,+81]mm · 3 of 80 slices shown (3 of 6)]
[im 1/80]
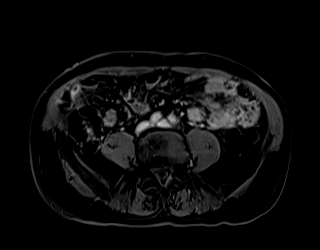
[im 40/80]
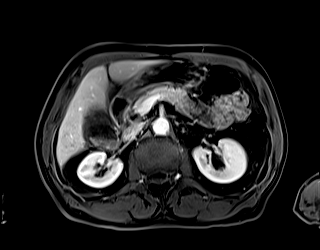
[im 80/80]
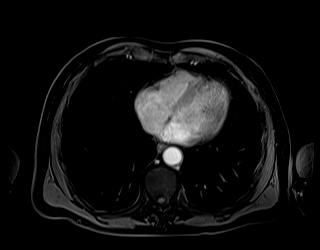

[Series 25: T1 dynamic · axial · 3.0mm · 1.25mm/px · z∈[-156,+81]mm · 3 of 80 slices shown (4 of 6)]
[im 1/80]
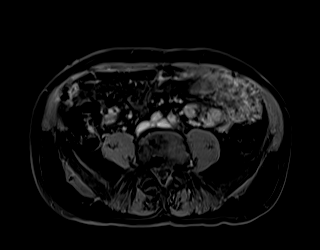
[im 40/80]
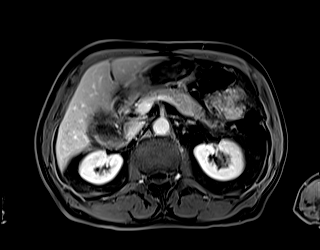
[im 80/80]
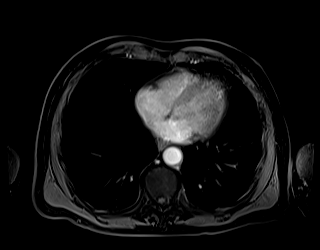

[Series 27: T1 dynamic · coronal · 4.0mm · 1.41mm/px · 2 of 60 slices shown (5 of 6)]
[im 1/60]
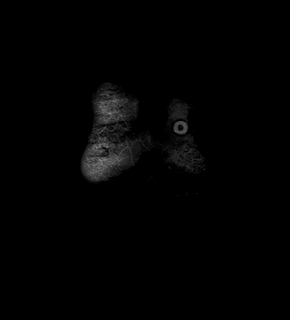
[im 60/60]
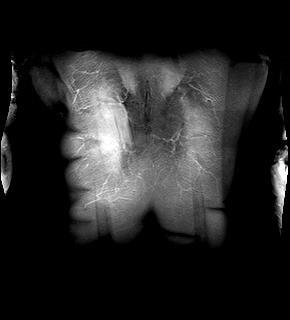

[Series 29: T1 dynamic · axial · 3.0mm · 1.25mm/px · z∈[-156,+81]mm · 3 of 80 slices shown (6 of 6)]
[im 1/80]
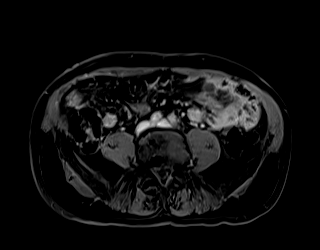
[im 40/80]
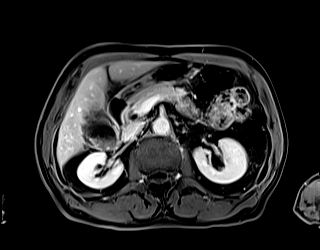
[im 80/80]
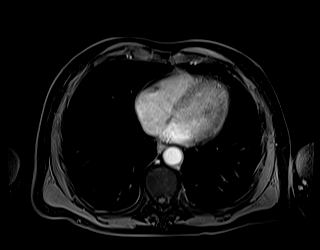

[Series 101: sub 20 sec · axial · 3.0mm · 1.25mm/px · z∈[-156,+81]mm · 3 of 80 slices shown]
[im 1/80]
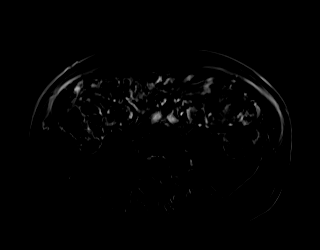
[im 40/80]
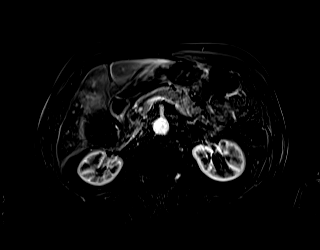
[im 80/80]
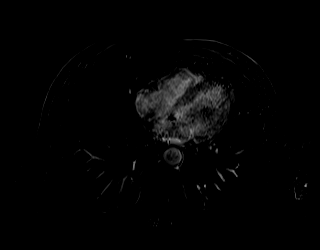

[Series 102: sub 45 sec · axial · 3.0mm · 1.25mm/px · z∈[-156,-39]mm · 2 of 80 slices shown]
[im 1/80]
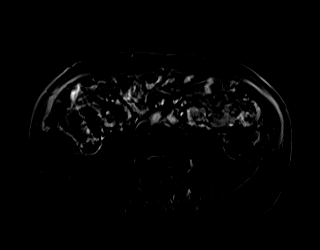
[im 40/80]
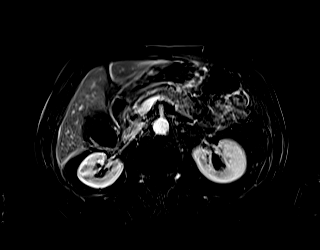

[39 of 48 positions shown; findings below may reference images not displayed]

FINDINGS: Lower chest: Lung bases are clear.

Hepatobiliary: Liver is within normal limits. No
suspicious/enhancing hepatic lesions. Specifically, no evidence of
direct invasion along the gallbladder fossa.

Distended gallbladder with cholelithiasis and irregular gallbladder
wall thickening with gallbladder wall edema. This appearance is
suspicious for acute cholecystitis.

No intrahepatic or extrahepatic ductal dilatation. Common duct
measures 8 mm. No choledocholithiasis is seen.

Pancreas:  Within normal limits.

Spleen:  Within normal limits.

Adrenals/Urinary Tract:  Adrenal glands are within normal limits.

Left renal sinus cysts. Right kidney is within normal limits. No
hydronephrosis.

Stomach/Bowel: Stomach and visualized bowel are unremarkable.

Vascular/Lymphatic:  No evidence of abdominal aortic aneurysm.

Dominant 2.2 cm short axis left para-aortic node (series 3/image 28)
and additional nodes measuring 10-11 mm (series 3/images 26-27).

Other:  No abdominopelvic ascites.

Musculoskeletal: No focal osseous lesions.
IMPRESSION: Suspected acute cholecystitis. No intrahepatic or extrahepatic
ductal dilatation. Common duct measures 8 mm. No choledocholithiasis
is seen.

Dominant 2.2 cm short axis left para-aortic node and additional
nodes measuring 10-11 mm. This appearance raises concern for nodal
malignancy (lymphoma or nodal metastases), but a primary lesion is
not identified on the current study or the prior CT. Given the
location, consider scrotal ultrasound for further evaluation. If
testicular primary is not identified, consider follow-up PET-CT (as
an outpatient).

## 2020-12-06 MED ORDER — GADOBUTROL 1 MMOL/ML IV SOLN
7.0000 mL | Freq: Once | INTRAVENOUS | Status: AC | PRN
  Administered 2020-12-06: 7 mL via INTRAVENOUS

## 2020-12-06 MED ORDER — CHLORHEXIDINE GLUCONATE CLOTH 2 % EX PADS: 6.0000 | MEDICATED_PAD | Freq: Once | CUTANEOUS | Status: DC

## 2020-12-06 MED ORDER — CHLORHEXIDINE GLUCONATE CLOTH 2 % EX PADS
6.0000 | MEDICATED_PAD | Freq: Once | CUTANEOUS | Status: AC
  Administered 2020-12-06: 23:00:00 6 via TOPICAL

## 2020-12-06 NOTE — H&P (View-Only) (Signed)
[facility] Consult Note  Arthur Guzman Mar 23, 1946  [id number].   PCP: Nickola Major, MD   Requesting MD: Landis Gandy Chief Complaint: RUQ pain Reason for Consult: Acute cholecystitis with retroperitoneal adenopathy vs  indeterminate malignancy  HPI:  Pt is a 74 year old male seen by primary care physician with right upper quadrant abdominal pain admitted to the medical service from the emergency room for evaluation.  Studies performed overnight include an USN showing sludge in the gallbladder, a small stone at the gallbladder neck, and wall thickening consistent with cholecystitis.  CT scan confirms these findings and also notes retroperitoneal lymphadenopathy of unknown origin, possibly occult malignancy.  Patient initially had pain on "Sunday, 12/19, which gradually improved yesterday and has essentially resolved this morning.  Lab studies show normal WBC and LFT's.  No history of hepatobiliary or pancreatic disease.  No prior abdominal surgery.  Patient was started on Zosyn by medical service.  Patient is retired from [employer].  ROS: Review of Systems  Constitutional: Negative.   HENT: Negative.   Eyes: Negative.   Respiratory: Negative.   Cardiovascular: Negative.   Gastrointestinal: Positive for abdominal pain.  Genitourinary: Negative.   Musculoskeletal: Negative.   Skin: Negative.   Neurological: Negative.   Endo/Heme/Allergies: Negative.   Psychiatric/Behavioral: Negative.     Family History  Problem Relation Age of Onset  . Cancer Mother        type unknown  . Heart disease Mother   . Stroke Brother     Past Medical History:  Diagnosis Date  . HTN (hypertension)   . Hyperlipidemia   . Migraine     Past Surgical History:  Procedure Laterality Date  . CATARACT EXTRACTION Right 2017  . LUMBAR DISC SURGERY     19" 95  . PATELLA FRACTURE SURGERY Left     Social History:  reports that he has never smoked. He has never used  smokeless tobacco. He reports that he does not drink alcohol and does not use drugs.  Allergies:  Allergies  Allergen Reactions  . Antihistamines, Chlorpheniramine-Type Other (See Comments)    Antihistamines make me fidgety.    Medications Prior to Admission  Medication Sig Dispense Refill  . aspirin EC 81 MG EC tablet Take 1 tablet (81 mg total) by mouth daily.    Marland Kitchen atorvastatin (LIPITOR) 40 MG tablet Take 40 mg by mouth daily.    . B Complex Vitamins (VITAMIN B COMPLEX PO) Take 1 tablet by mouth daily.    . traMADol (ULTRAM) 50 MG tablet Take 50 mg by mouth 2 (two) times daily as needed for severe pain.       Blood pressure 120/70, pulse 65, temperature 98.7 F (37.1 C), temperature source Oral, resp. rate 18, height 5\' 9"  (1.753 m), weight 76.7 kg, SpO2 98 %.  CONSTITUTIONAL: no acute distress; conversant; no obvious deformities  EYES: conjunctiva moist; no lid lag; anicteric; pupils equal bilaterally  NECK: trachea midline; no thyroid nodularity  LUNGS: respiratory effort normal & unlabored; no wheeze; no rales; no tactile fremitus  CV: rate and rhythm regular; no palpable thrills; no murmur; no edema bilat lower extremities  GI: abdomen is soft without distension; no mass; no tenderness; no hepatosplenomegaly; no obvious hernia  MSK: normal range of motion of extremities; no clubbing; no cyanosis  PSYCH: appropriate affect for situation; alert and oriented to person, place, & time  LYMPHATIC: no palpable cervical lymphadenopathy; no evidence lymphedema in extremities    Results for orders placed  or performed during the hospital encounter of 12/05/20 (from the past 48 hour(s))  CBC with Differential     Status: Abnormal   Collection Time: 12/05/20  5:20 PM  Result Value Ref Range   WBC 12.7 (H) 4.0 - 10.5 K/uL   RBC 4.28 4.22 - 5.81 MIL/uL   Hemoglobin 13.6 13.0 - 17.0 g/dL   HCT 39.2 39.0 - 52.0 %   MCV 91.6 80.0 - 100.0 fL   MCH 31.8 26.0 - 34.0 pg    MCHC 34.7 30.0 - 36.0 g/dL   RDW 13.2 11.5 - 15.5 %   Platelets 174 150 - 400 K/uL   nRBC 0.0 0.0 - 0.2 %   Neutrophils Relative % 69 %   Neutro Abs 8.8 (H) 1.7 - 7.7 K/uL   Lymphocytes Relative 20 %   Lymphs Abs 2.5 0.7 - 4.0 K/uL   Monocytes Relative 11 %   Monocytes Absolute 1.4 (H) 0.1 - 1.0 K/uL   Eosinophils Relative 0 %   Eosinophils Absolute 0.0 0.0 - 0.5 K/uL   Basophils Relative 0 %   Basophils Absolute 0.0 0.0 - 0.1 K/uL   Immature Granulocytes 0 %   Abs Immature Granulocytes 0.04 0.00 - 0.07 K/uL    Comment: Performed at [facility], [address] [place], [place] [address]  Comprehensive metabolic panel     Status: Abnormal   Collection Time: 12/05/20  5:20 PM  Result Value Ref Range   Sodium 137 135 - 145 mmol/L   Potassium 3.4 (L) 3.5 - 5.1 mmol/L   Chloride 101 98 - 111 mmol/L   CO2 27 22 - 32 mmol/L   Glucose, Bld 103 (H) 70 - 99 mg/dL    Comment: Glucose reference range applies only to samples taken after fasting for at least 8 hours.   BUN 14 8 - 23 mg/dL   Creatinine, Ser 0.84 0.61 - 1.24 mg/dL   Calcium 9.0 8.9 - 10.3 mg/dL   Total Protein 7.2 6.5 - 8.1 g/dL   Albumin 3.8 3.5 - 5.0 g/dL   AST 24 15 - 41 U/L   ALT 23 0 - 44 U/L   Alkaline Phosphatase 89 38 - 126 U/L   Total Bilirubin 0.7 0.3 - 1.2 mg/dL   GFR, Estimated >60 >60 mL/min    Comment: (NOTE) Calculated using the CKD-EPI Creatinine Equation (2021)    Anion gap 9 5 - 15    Comment: Performed at [facility], [address] [place], [place] [address]  Lipase, blood     Status: None   Collection Time: 12/05/20  5:20 PM  Result Value Ref Range   Lipase 27 11 - 51 U/L    Comment: [facility]formed at  [facility], [address] [place], [place] [address]  Resp Panel by RT-PCR (Flu A&B, Covid) Nasopharyngeal Swab     Status: None   Collection Time: 12/05/20  5:20 PM   Specimen: Nasopharyngeal Swab; Nasopharyngeal(NP) swabs in vial transport  medium  Result Value Ref Range   SARS Coronavirus 2 by RT PCR NEGATIVE NEGATIVE    Comment: (NOTE) SARS-CoV-2 target nucleic acids are NOT DETECTED.  The SARS-CoV-2 RNA is generally detectable in upper respiratory specimens during the acute phase of infection. The lowest concentration of SARS-CoV-2 viral copies this assay can detect is 138 copies/mL. A negative result does not preclude SARS-Cov-2 infection and should not be used as the sole basis for treatment or other patient management decisions.   A negative result may occur with  improper specimen collection/handling, submission of specimen other than nasopharyngeal swab, presence of viral mutation(s) within the areas targeted by this assay, and inadequate number of viral copies(<138 copies/mL). A negative result must be combined with clinical observations, patient history, and epidemiological information. The expected result is Negative.  Fact Sheet for Patients:  https://www.fda.gov/media/152166/download  Fact Sheet for Healthcare Providers:  https://www.fda.gov/media/152162/download  This test is no t yet approved or cleared by the [place] FDA and  has been authorized for detection and/or diagnosis of SARS-CoV-2 by FDA under an Emergency Use Authorization (EUA). This EUA will remain  in effect (meaning this test can be used) for the duration of the COVID-19 declaration under Section 564(b)(1) of the Act, 21 U.S.C.section 360bbb-3(b)(1), unless the authorization is terminated  or revoked sooner.       Influenza A by PCR NEGATIVE NEGATIVE   Influenza B by PCR NEGATIVE NEGATIVE    Comment: (NOTE) The Xpert Xpress SARS-CoV-2/FLU/RSV plus assay is intended as an aid in the diagnosis of influenza from Nasopharyngeal swab specimens and should not be used as a sole basis for treatment. Nasal washings and aspirates are unacceptable for Xpert Xpress SARS-CoV-2/FLU/RSV testing.  Fact Sheet for  Patients: https://www.fda.gov/media/152166/download  Fact Sheet for Healthcare Providers: https://www.fda.gov/media/152162/download  This test is not yet approved or cleared by the [place] FDA and has been authorized for detection and/or diagnosis of SARS-CoV-2 by FDA under an Emergency Use Authorization (EUA). This EUA will remain in effect (meaning this test can be used) for the duration of the COVID-19 declaration under Section 564(b)(1) of the Act, 21 U.S.C. section 360bbb-3(b)(1), unless the authorization is terminated or revoked.  Performed at [facility], [address] [place], [place] [address]   Lactic acid, plasma     Status: None   Collection Time: 12/05/20  5:45 PM  Result Value Ref Range   Lactic Acid, Venous 1.0 0.5 - 1.9 mmol/L    Comment: Performed at [facility], [address] [place], [place] [address]  Magnesium     Status: Abnormal   Collection Time: 12/05/20  7:00 PM  Result Value Ref Range   Magnesium 1.6 (L) 1.7 - 2.4 mg/dL    Comment: Performed at [facility], [address] [place], [place] [address]  Lactic acid, plasma     Status: None   Collection Time: 12/05/20  9:52 PM  Result Value Ref Range   Lactic Acid, Venous 1.0 0.5 - 1.9 mmol/L    Comment: Performed at [facility], [address] [place], [place] [address]  CBC     Status: Abnormal   Collection Time: 12/06/20  4:55 AM  Result Value Ref Range   WBC 8.5 4.0 - 10.5 K/uL   RBC 3.75 (L) 4.22 - 5.81 MIL/uL   Hemoglobin 11.8 (L) 13.0 - 17.0 g/dL   HCT 34.8 (L) 39.0 - 52.0 %   MCV 92.8 80.0 - 100.0 fL   MCH 31.5 26.0 - 34.0 pg   MCHC 33.9 30.0 - 36.0 g/dL   RDW 13.3 11.5 - 15.5 %   Platelets 158 150 - 400 K/uL   nRBC 0.0 0.0 - 0.2 %    Comment: Performed at [facility], [address] [place],  [address]  Comprehensive metabolic panel     Status: Abnormal   Collection Time:  12/06/20  4:55 AM  Result Value Ref Range   Sodium   138 135 - 145 mmol/L   Potassium 3.9 3.5 - 5.1 mmol/L   Chloride 106 98 - 111 mmol/L   CO2 29 22 - 32 mmol/L   Glucose, Bld 99 70 - 99 mg/dL    Comment: Glucose reference range applies only to samples taken after fasting for at least 8 hours.   BUN 13 8 - 23 mg/dL   Creatinine, Ser 0.95 0.61 - 1.24 mg/dL   Calcium 8.4 (L) 8.9 - 10.3 mg/dL   Total Protein 6.2 (L) 6.5 - 8.1 g/dL   Albumin 3.2 (L) 3.5 - 5.0 g/dL   AST 19 15 - 41 U/L   ALT 19 0 - 44 U/L   Alkaline Phosphatase 71 38 - 126 U/L   Total Bilirubin 1.1 0.3 - 1.2 mg/dL   GFR, Estimated >60 >60 mL/min    Comment: (NOTE) Calculated using the CKD-EPI Creatinine Equation (2021)    Anion gap 3 (L) 5 - 15    Comment: Performed at [facility], [address] [place], [place] [address]   CT ABDOMEN PELVIS W CONTRAST  Result Date: 12/05/2020 CLINICAL DATA:  74-year-old male with abdominal pain. EXAM: CT ABDOMEN AND PELVIS WITH CONTRAST TECHNIQUE: Multidetector CT imaging of the abdomen and pelvis was performed using the standard protocol following bolus administration of intravenous contrast. CONTRAST:  100mL OMNIPAQUE IOHEXOL 300 MG/ML  SOLN COMPARISON:  Abdominal ultrasound dated 12/05/2020. FINDINGS: Lower chest: The visualized lung bases are clear. No intra-abdominal free air or free fluid. Hepatobiliary: The liver is unremarkable. There is mild intrahepatic biliary dilatation or mild periportal edema. The gallbladder contains sludge or noncalcified stones. There is a 4 mm stone in the neck of the gallbladder. There is pericholecystic fluid and diffuse gallbladder wall edema. Findings are most consistent with acute cholecystitis. Clinical correlation is recommended. Pancreas: Unremarkable. No pancreatic ductal dilatation or surrounding inflammatory changes. Spleen: Normal in size without focal abnormality. Adrenals/Urinary Tract: The adrenal glands unremarkable. There  is no hydronephrosis on either side. There is symmetric enhancement and excretion of contrast by both kidneys. Left renal parapelvic cysts. The visualized ureters and urinary bladder appear unremarkable. Stomach/Bowel: There is moderate stool throughout the colon. There is no bowel obstruction or active inflammation. The appendix is normal. Vascular/Lymphatic: Mild aortoiliac atherosclerotic disease. The IVC is unremarkable. No portal venous gas. Left para-aortic/retroperitoneal adenopathy measuring up to 2.6 x 2.2 cm of indeterminate etiology but concerning for malignancy/metastatic disease. There is a trace fluid or a mildly enlarged nodule/lymph node or serosal implant along the inferior aspect of the cecum measuring 13 x 9 mm (coronal 50/5 and axial 53/2). Reproductive: The prostate and seminal vesicles are grossly unremarkable. Other: None Musculoskeletal: Osteopenia with degenerative changes of the spine. No acute osseous pathology. IMPRESSION: 1. Cholelithiasis with findings most consistent with acute cholecystitis. Clinical correlation is recommended. 2. Left para-aortic/retroperitoneal adenopathy of indeterminate etiology but concerning for malignancy/metastatic disease. 3. Moderate colonic stool burden. No bowel obstruction. Normal appendix. 4. Aortic Atherosclerosis (ICD10-I70.0). Electronically Signed   By: Arash  Radparvar M.D.   On: 12/05/2020 22:52   US Abdomen Limited RUQ (LIVER/GB)  Result Date: 12/05/2020 CLINICAL DATA:  Right upper quadrant pain EXAM: ULTRASOUND ABDOMEN LIMITED RIGHT UPPER QUADRANT COMPARISON:  None. FINDINGS: Gallbladder: No gallstones. Sludge is present. Wall thickening measuring up to 1 cm, some of which is masslike in appearance. Pericholecystic fluid is present. No sonographic Murphy sign noted by sonographer. Common bile duct: Diameter: 5 mm, normal Liver: No focal lesion identified. Within   normal limits in parenchymal echogenicity. Mild intrahepatic duct dilatation.  Portal vein is patent on color Doppler imaging with normal direction of blood flow towards the liver. Other: None. IMPRESSION: Abnormal appearance of gallbladder raising possibility of acute cholecystitis. However, there is no sonographic Murphy sign and some of the wall thickening is masslike in appearance. Recommend contrast enhanced CT or MRI for further evaluation. Mild intrahepatic duct dilatation. Electronically Signed   By: Praneil  Patel M.D.   On: 12/05/2020 18:41    Assessment/Plan Cholecystitis with gallbladder sludge, cholelithiasis  IV Zosyn started  Allow clear liquid diet today - NPO after midnight  Plan lap chole with IOC tomorrow Retroperitoneal lymphadenopathy  Additional work up per medical service  MRI scheduled for later today  Will evaluate peritoneal cavity at the time of cholecystectomy - possible biopsy  Discussed lap chole with IOC with patient.  Discussed possible need for open surgery.  Discussed hospital stay and post op recovery.  The risks and benefits of the procedure have been discussed at length with the patient.  The patient understands the proposed procedure, potential alternative treatments, and the course of recovery to be expected.  All of the patient's questions have been answered at this time.  The patient wishes to proceed with surgery.  Vishnu Moeller, MD [facility] Office: [phone]  JENNINGS,WILLARD, PA-C [facility] 12/06/2020, 6:57 AM Please see Amion for pager number during day hours 7:00am-4:30pm   

## 2020-12-06 NOTE — Progress Notes (Signed)
PROGRESS NOTE    Arthur Guzman  IRJ:[id number] DOB: [birth year] DOA: 12/05/2020 PCP: Nickola Major, MD   Brief Narrative: Arthur Guzman is a 74 y.o. male with medical history significant of hypertension, hyperlipidemia sent in from the PCPs office with abnormal CT findings of cholecystitis line lymphadenopathy.  The CT was done as an outpatient without contrast. Patient complains of right upper quadrant pain that started night prior to admission no associated fever chills nausea vomiting diarrhea.  No weight loss.  He has good appetite. Denies chest pain shortness of breath cough headaches. CT abdomen done as an outpatient today prior to admission is in care everywhere.  Findings are concerning for acute cholecystitis, intraluminal stones and question of blood products.  Underlying mass not excluded by unenhanced CT.  Left retroperitoneal lymphadenopathy concerning for metastatic disease or lymphoma.   Assessment & Plan:   Principal Problem:   Cholecystitis with cholelithiasis Active Problems:   RUQ abdominal pain   Hypokalemia   Leukocytosis  #1 cholecystitis with cholelithiasis-patient presented with abdominal pain and CT findings of cholecystitis.   Ultrasound of the abdomen done overnight shows findings consistent with cholecystitis.   A CT of the abdomen and pelvis was done with IV contrast which is concerning for retroperitoneal lymph nodes and lymphadenopathy.   MRI of the abdomen ordered.  Patient to go for surgery tomorrow, planning to get biopsy at that time by general surgery.  Continue Zosyn.   White count on admission 12.7 down to 8.5 today.  LFTs are normal. Surgery okay with starting him on clear liquids and n.p.o. after midnight.  Hold Lovenox.  #2 hypokalemia resolved.  #3 hyperlipidemia on statin at home which has not been restarted.  Estimated body mass index is 24.97 kg/m as calculated from the following:   Height as of this encounter: 5\' 9"   (1.753 m).   Weight as of this encounter: 76.7 kg.  DVT prophylaxis: Lovenox on hold for surgery SCD  code Status: Full full code Family Communication: None at bedside today  disposition Plan:  Status is: Observation  Dispo: The patient is from: Home              Anticipated d/c is to: Home              Anticipated d/c date is: 1 day              Patient currently is not medically stable to d/c.  Consultants:  surgery  Procedures:none  Antimicrobials:zosyn  Subjective: Awake alert nad  No pain nausea vomiiting  Objective: Vitals:   12/05/20 1942 12/05/20 2122 12/06/20 0232 12/06/20 0640  BP:  (!) 149/79 128/69 120/70  Pulse:  74 65   Resp:  18 18 18   Temp:  98.3 F (36.8 C) 99.2 F (37.3 C) 98.7 F (37.1 C)  TempSrc:  Oral Oral Oral  SpO2:  98% 97% 98%  Weight: 76.7 kg     Height: 5\' 9"  (1.753 m)       Intake/Output Summary (Last 24 hours) at 12/06/2020 1153 Last data filed at 12/06/2020 0649 Gross per 24 hour  Intake 1125.15 ml  Output 1450 ml  Net -324.85 ml   Filed Weights   12/05/20 1942  Weight: 76.7 kg    Examination:  General exam: Appears calm and comfortable  Respiratory system: Clear to auscultation. Respiratory effort normal. Cardiovascular system: S1 & S2 heard, RRR. No JVD, murmurs, rubs, gallops or clicks. No pedal edema. Gastrointestinal system: Abdomen  is nondistended, soft and nontender. No organomegaly or masses felt. Normal bowel sounds heard. Central nervous system: Alert and oriented. No focal neurological deficits. Extremities: Symmetric 5 x 5 power. Skin: No rashes, lesions or ulcers Psychiatry: Judgement and insight appear normal. Mood & affect appropriate.     Data Reviewed: I have personally reviewed following labs and imaging studies  CBC: Recent Labs  Lab 12/05/20 1720 12/06/20 0455  WBC 12.7* 8.5  NEUTROABS 8.8*  --   HGB 13.6 11.8*  HCT 39.2 34.8*  MCV 91.6 92.8  PLT 174 595   Basic Metabolic Panel: Recent  Labs  Lab 12/05/20 1720 12/05/20 1900 12/06/20 0455  NA 137  --  138  K 3.4*  --  3.9  CL 101  --  106  CO2 27  --  29  GLUCOSE 103*  --  99  BUN 14  --  13  CREATININE 0.84  --  0.95  CALCIUM 9.0  --  8.4*  MG  --  1.6*  --    GFR: Estimated Creatinine Clearance: 67.2 mL/min (by C-G formula based on SCr of 0.95 mg/dL). Liver Function Tests: Recent Labs  Lab 12/05/20 1720 12/06/20 0455  AST 24 19  ALT 23 19  ALKPHOS 89 71  BILITOT 0.7 1.1  PROT 7.2 6.2*  ALBUMIN 3.8 3.2*   Recent Labs  Lab 12/05/20 1720  LIPASE 27   No results for input(s): AMMONIA in the last 168 hours. Coagulation Profile: No results for input(s): INR, PROTIME in the last 168 hours. Cardiac Enzymes: No results for input(s): CKTOTAL, CKMB, CKMBINDEX, TROPONINI in the last 168 hours. BNP (last 3 results) No results for input(s): PROBNP in the last 8760 hours. HbA1C: No results for input(s): HGBA1C in the last 72 hours. CBG: No results for input(s): GLUCAP in the last 168 hours. Lipid Profile: No results for input(s): CHOL, HDL, LDLCALC, TRIG, CHOLHDL, LDLDIRECT in the last 72 hours. Thyroid Function Tests: No results for input(s): TSH, T4TOTAL, FREET4, T3FREE, THYROIDAB in the last 72 hours. Anemia Panel: No results for input(s): VITAMINB12, FOLATE, FERRITIN, TIBC, IRON, RETICCTPCT in the last 72 hours. Sepsis Labs: Recent Labs  Lab 12/05/20 1745 12/05/20 2152  LATICACIDVEN 1.0 1.0    Recent Results (from the past 240 hour(s))  Resp Panel by RT-PCR (Flu A&B, Covid) Nasopharyngeal Swab     Status: None   Collection Time: 12/05/20  5:20 PM   Specimen: Nasopharyngeal Swab; Nasopharyngeal(NP) swabs in vial transport medium  Result Value Ref Range Status   SARS Coronavirus 2 by RT PCR NEGATIVE NEGATIVE Final    Comment: (NOTE) SARS-CoV-2 target nucleic acids are NOT DETECTED.  The SARS-CoV-2 RNA is generally detectable in upper respiratory specimens during the acute phase of infection.  The lowest concentration of SARS-CoV-2 viral copies this assay can detect is 138 copies/mL. A negative result does not preclude SARS-Cov-2 infection and should not be used as the sole basis for treatment or other patient management decisions. A negative result may occur with  improper specimen collection/handling, submission of specimen other than nasopharyngeal swab, presence of viral mutation(s) within the areas targeted by this assay, and inadequate number of viral copies(<138 copies/mL). A negative result must be combined with clinical observations, patient history, and epidemiological information. The expected result is Negative.  Fact Sheet for Patients:  EntrepreneurPulse.com.au  Fact Sheet for Healthcare Providers:  IncredibleEmployment.be  This test is no t yet approved or cleared by the [place] FDA and  has been  authorized for detection and/or diagnosis of SARS-CoV-2 by FDA under an Emergency Use Authorization (EUA). This EUA will remain  in effect (meaning this test can be used) for the duration of the COVID-19 declaration under Section 564(b)(1) of the Act, 21 U.S.C.section 360bbb-3(b)(1), unless the authorization is terminated  or revoked sooner.       Influenza A by PCR NEGATIVE NEGATIVE Final   Influenza B by PCR NEGATIVE NEGATIVE Final    Comment: (NOTE) The Xpert Xpress SARS-CoV-2/FLU/RSV plus assay is intended as an aid in the diagnosis of influenza from Nasopharyngeal swab specimens and should not be used as a sole basis for treatment. Nasal washings and aspirates are unacceptable for Xpert Xpress SARS-CoV-2/FLU/RSV testing.  Fact Sheet for Patients: BloggerCourse.com  Fact Sheet for Healthcare Providers: SeriousBroker.it  This test is not yet approved or cleared by the [place] FDA and has been authorized for detection and/or diagnosis of SARS-CoV-2 by FDA  under an Emergency Use Authorization (EUA). This EUA will remain in effect (meaning this test can be used) for the duration of the COVID-19 declaration under Section 564(b)(1) of the Act, 21 U.S.C. section 360bbb-3(b)(1), unless the authorization is terminated or revoked.  Performed at [facility], [address] [place], [place] [address]          Radiology Studies: CT ABDOMEN PELVIS W CONTRAST  Result Date: 12/05/2020 CLINICAL DATA:  74 year old male with abdominal pain. EXAM: CT ABDOMEN AND PELVIS WITH CONTRAST TECHNIQUE: Multidetector CT imaging of the abdomen and pelvis was performed using the standard protocol following bolus administration of intravenous contrast. CONTRAST:  OMNIPAQUE IOHEXOL 300 MG/ML  SOLN COMPARISON:  Abdominal ultrasound dated 12/05/2020. FINDINGS: Lower chest: The visualized lung bases are clear. No intra-abdominal free air or free fluid. Hepatobiliary: The liver is unremarkable. There is mild intrahepatic biliary dilatation or mild periportal edema. The gallbladder contains sludge or noncalcified stones. There is a 4 mm stone in the neck of the gallbladder. There is pericholecystic fluid and diffuse gallbladder wall edema. Findings are most consistent with acute cholecystitis. Clinical correlation is recommended. Pancreas: Unremarkable. No pancreatic ductal dilatation or surrounding inflammatory changes. Spleen: Normal in size without focal abnormality. Adrenals/Urinary Tract: The adrenal glands unremarkable. There is no hydronephrosis on either side. There is symmetric enhancement and excretion of contrast by both kidneys. Left renal parapelvic cysts. The visualized ureters and urinary bladder appear unremarkable. Stomach/Bowel: There is moderate stool throughout the colon. There is no bowel obstruction or active inflammation. The appendix is normal. Vascular/Lymphatic: Mild aortoiliac atherosclerotic disease. The IVC is unremarkable. No  portal venous gas. Left para-aortic/retroperitoneal adenopathy measuring up to 2.6 x 2.2 cm of indeterminate etiology but concerning for malignancy/metastatic disease. There is a trace fluid or a mildly enlarged nodule/lymph node or serosal implant along the inferior aspect of the cecum measuring 13 x 9 mm (coronal 50/5 and axial 53/2). Reproductive: The prostate and seminal vesicles are grossly unremarkable. Other: None Musculoskeletal: Osteopenia with degenerative changes of the spine. No acute osseous pathology. IMPRESSION: 1. Cholelithiasis with findings most consistent with acute cholecystitis. Clinical correlation is recommended. 2. Left para-aortic/retroperitoneal adenopathy of indeterminate etiology but concerning for malignancy/metastatic disease. 3. Moderate colonic stool burden. No bowel obstruction. Normal appendix. 4. Aortic Atherosclerosis (ICD10-I70.0). Electronically Signed   By: Elgie Collard M.D.   On: 12/05/2020 22:52   MR 3D Recon At Scanner  Result Date: 12/06/2020 CLINICAL DATA:  Suspected acute cholecystitis on CT. Left para-aortic/retroperitoneal lymphadenopathy. EXAM: MRI ABDOMEN WITHOUT AND WITH  CONTRAST (INCLUDING MRCP) TECHNIQUE: Multiplanar multisequence MR imaging of the abdomen was performed both before and after the administration of intravenous contrast. Heavily T2-weighted images of the biliary and pancreatic ducts were obtained, and three-dimensional MRCP images were rendered by post processing. CONTRAST:  42mL GADAVIST GADOBUTROL 1 MMOL/ML IV SOLN COMPARISON:  CT abdomen/pelvis and right upper quadrant ultrasound dated 12/05/2020. FINDINGS: Lower chest: Lung bases are clear. Hepatobiliary: Liver is within normal limits. No suspicious/enhancing hepatic lesions. Specifically, no evidence of direct invasion along the gallbladder fossa. Distended gallbladder with cholelithiasis and irregular gallbladder wall thickening with gallbladder wall edema. This appearance is suspicious  for acute cholecystitis. No intrahepatic or extrahepatic ductal dilatation. Common duct measures 8 mm. No choledocholithiasis is seen. Pancreas:  Within normal limits. Spleen:  Within normal limits. Adrenals/Urinary Tract:  Adrenal glands are within normal limits. Left renal sinus cysts. Right kidney is within normal limits. No hydronephrosis. Stomach/Bowel: Stomach and visualized bowel are unremarkable. Vascular/Lymphatic:  No evidence of abdominal aortic aneurysm. Dominant 2.2 cm short axis left para-aortic node (series 3/image 28) and additional nodes measuring 10-11 mm (series 3/images 26-27). Other:  No abdominopelvic ascites. Musculoskeletal: No focal osseous lesions. IMPRESSION: Suspected acute cholecystitis. No intrahepatic or extrahepatic ductal dilatation. Common duct measures 8 mm. No choledocholithiasis is seen. Dominant 2.2 cm short axis left para-aortic node and additional nodes measuring 10-11 mm. This appearance raises concern for nodal malignancy (lymphoma or nodal metastases), but a primary lesion is not identified on the current study or the prior CT. Given the location, consider scrotal ultrasound for further evaluation. If testicular primary is not identified, consider follow-up PET-CT (as an outpatient). Electronically Signed   By: Charline Bills M.D.   On: 12/06/2020 11:43   MR ABDOMEN MRCP W WO CONTAST  Result Date: 12/06/2020 CLINICAL DATA:  Suspected acute cholecystitis on CT. Left para-aortic/retroperitoneal lymphadenopathy. EXAM: MRI ABDOMEN WITHOUT AND WITH CONTRAST (INCLUDING MRCP) TECHNIQUE: Multiplanar multisequence MR imaging of the abdomen was performed both before and after the administration of intravenous contrast. Heavily T2-weighted images of the biliary and pancreatic ducts were obtained, and three-dimensional MRCP images were rendered by post processing. CONTRAST:  40mL GADAVIST GADOBUTROL 1 MMOL/ML IV SOLN COMPARISON:  CT abdomen/pelvis and right upper quadrant  ultrasound dated 12/05/2020. FINDINGS: Lower chest: Lung bases are clear. Hepatobiliary: Liver is within normal limits. No suspicious/enhancing hepatic lesions. Specifically, no evidence of direct invasion along the gallbladder fossa. Distended gallbladder with cholelithiasis and irregular gallbladder wall thickening with gallbladder wall edema. This appearance is suspicious for acute cholecystitis. No intrahepatic or extrahepatic ductal dilatation. Common duct measures 8 mm. No choledocholithiasis is seen. Pancreas:  Within normal limits. Spleen:  Within normal limits. Adrenals/Urinary Tract:  Adrenal glands are within normal limits. Left renal sinus cysts. Right kidney is within normal limits. No hydronephrosis. Stomach/Bowel: Stomach and visualized bowel are unremarkable. Vascular/Lymphatic:  No evidence of abdominal aortic aneurysm. Dominant 2.2 cm short axis left para-aortic node (series 3/image 28) and additional nodes measuring 10-11 mm (series 3/images 26-27). Other:  No abdominopelvic ascites. Musculoskeletal: No focal osseous lesions. IMPRESSION: Suspected acute cholecystitis. No intrahepatic or extrahepatic ductal dilatation. Common duct measures 8 mm. No choledocholithiasis is seen. Dominant 2.2 cm short axis left para-aortic node and additional nodes measuring 10-11 mm. This appearance raises concern for nodal malignancy (lymphoma or nodal metastases), but a primary lesion is not identified on the current study or the prior CT. Given the location, consider scrotal ultrasound for further evaluation. If testicular primary is not identified, consider  follow-up PET-CT (as an outpatient). Electronically Signed   By: Charline Bills M.D.   On: 12/06/2020 11:43   US Abdomen Limited RUQ (LIVER/GB)  Result Date: 12/05/2020 CLINICAL DATA:  Right upper quadrant pain EXAM: ULTRASOUND ABDOMEN LIMITED RIGHT UPPER QUADRANT COMPARISON:  None. FINDINGS: Gallbladder: No gallstones. Sludge is present. Wall  thickening measuring up to 1 cm, some of which is masslike in appearance. Pericholecystic fluid is present. No sonographic Murphy sign noted by sonographer. Common bile duct: Diameter: 5 mm, normal Liver: No focal lesion identified. Within normal limits in parenchymal echogenicity. Mild intrahepatic duct dilatation. Portal vein is patent on color Doppler imaging with normal direction of blood flow towards the liver. Other: None. IMPRESSION: Abnormal appearance of gallbladder raising possibility of acute cholecystitis. However, there is no sonographic Murphy sign and some of the wall thickening is masslike in appearance. Recommend contrast enhanced CT or MRI for further evaluation. Mild intrahepatic duct dilatation. Electronically Signed   By: Guadlupe Spanish M.D.   On: 12/05/2020 18:41        Scheduled Meds: . enoxaparin (LOVENOX) injection  40 mg Subcutaneous Q24H   Continuous Infusions: . 0.9 % NaCl with KCl 40 mEq / L 100 mL/hr at 12/06/20 1140  . piperacillin-tazobactam (ZOSYN)  IV 12.5 mL/hr at 12/06/20 1138     LOS: 0 days     Alwyn Ren, MD  12/06/2020, 11:53 AM

## 2020-12-06 NOTE — Consult Note (Addendum)
[facility] Consult Note  Arthur Guzman Mar 23, 1946  [id number].   PCP: Nickola Major, MD   Requesting MD: Landis Gandy Chief Complaint: RUQ pain Reason for Consult: Acute cholecystitis with retroperitoneal adenopathy vs  indeterminate malignancy  HPI:  Pt is a 74 year old male seen by primary care physician with right upper quadrant abdominal pain admitted to the medical service from the emergency room for evaluation.  Studies performed overnight include an USN showing sludge in the gallbladder, a small stone at the gallbladder neck, and wall thickening consistent with cholecystitis.  CT scan confirms these findings and also notes retroperitoneal lymphadenopathy of unknown origin, possibly occult malignancy.  Patient initially had pain on "Sunday, 12/19, which gradually improved yesterday and has essentially resolved this morning.  Lab studies show normal WBC and LFT's.  No history of hepatobiliary or pancreatic disease.  No prior abdominal surgery.  Patient was started on Zosyn by medical service.  Patient is retired from [employer].  ROS: Review of Systems  Constitutional: Negative.   HENT: Negative.   Eyes: Negative.   Respiratory: Negative.   Cardiovascular: Negative.   Gastrointestinal: Positive for abdominal pain.  Genitourinary: Negative.   Musculoskeletal: Negative.   Skin: Negative.   Neurological: Negative.   Endo/Heme/Allergies: Negative.   Psychiatric/Behavioral: Negative.     Family History  Problem Relation Age of Onset  . Cancer Mother        type unknown  . Heart disease Mother   . Stroke Brother     Past Medical History:  Diagnosis Date  . HTN (hypertension)   . Hyperlipidemia   . Migraine     Past Surgical History:  Procedure Laterality Date  . CATARACT EXTRACTION Right 2017  . LUMBAR DISC SURGERY     19" 95  . PATELLA FRACTURE SURGERY Left     Social History:  reports that he has never smoked. He has never used  smokeless tobacco. He reports that he does not drink alcohol and does not use drugs.  Allergies:  Allergies  Allergen Reactions  . Antihistamines, Chlorpheniramine-Type Other (See Comments)    Antihistamines make me fidgety.    Medications Prior to Admission  Medication Sig Dispense Refill  . aspirin EC 81 MG EC tablet Take 1 tablet (81 mg total) by mouth daily.    Marland Kitchen atorvastatin (LIPITOR) 40 MG tablet Take 40 mg by mouth daily.    . B Complex Vitamins (VITAMIN B COMPLEX PO) Take 1 tablet by mouth daily.    . traMADol (ULTRAM) 50 MG tablet Take 50 mg by mouth 2 (two) times daily as needed for severe pain.       Blood pressure 120/70, pulse 65, temperature 98.7 F (37.1 C), temperature source Oral, resp. rate 18, height 5\' 9"  (1.753 m), weight 76.7 kg, SpO2 98 %.  CONSTITUTIONAL: no acute distress; conversant; no obvious deformities  EYES: conjunctiva moist; no lid lag; anicteric; pupils equal bilaterally  NECK: trachea midline; no thyroid nodularity  LUNGS: respiratory effort normal & unlabored; no wheeze; no rales; no tactile fremitus  CV: rate and rhythm regular; no palpable thrills; no murmur; no edema bilat lower extremities  GI: abdomen is soft without distension; no mass; no tenderness; no hepatosplenomegaly; no obvious hernia  MSK: normal range of motion of extremities; no clubbing; no cyanosis  PSYCH: appropriate affect for situation; alert and oriented to person, place, & time  LYMPHATIC: no palpable cervical lymphadenopathy; no evidence lymphedema in extremities    Results for orders placed  or performed during the hospital encounter of 12/05/20 (from the past 48 hour(s))  CBC with Differential     Status: Abnormal   Collection Time: 12/05/20  5:20 PM  Result Value Ref Range   WBC 12.7 (H) 4.0 - 10.5 K/uL   RBC 4.28 4.22 - 5.81 MIL/uL   Hemoglobin 13.6 13.0 - 17.0 g/dL   HCT 39.2 39.0 - 52.0 %   MCV 91.6 80.0 - 100.0 fL   MCH 31.8 26.0 - 34.0 pg    MCHC 34.7 30.0 - 36.0 g/dL   RDW 13.2 11.5 - 15.5 %   Platelets 174 150 - 400 K/uL   nRBC 0.0 0.0 - 0.2 %   Neutrophils Relative % 69 %   Neutro Abs 8.8 (H) 1.7 - 7.7 K/uL   Lymphocytes Relative 20 %   Lymphs Abs 2.5 0.7 - 4.0 K/uL   Monocytes Relative 11 %   Monocytes Absolute 1.4 (H) 0.1 - 1.0 K/uL   Eosinophils Relative 0 %   Eosinophils Absolute 0.0 0.0 - 0.5 K/uL   Basophils Relative 0 %   Basophils Absolute 0.0 0.0 - 0.1 K/uL   Immature Granulocytes 0 %   Abs Immature Granulocytes 0.04 0.00 - 0.07 K/uL    Comment: Performed at [facility], [place] [address], [place], [place] [address]  Comprehensive metabolic panel     Status: Abnormal   Collection Time: 12/05/20  5:20 PM  Result Value Ref Range   Sodium 137 135 - 145 mmol/L   Potassium 3.4 (L) 3.5 - 5.1 mmol/L   Chloride 101 98 - 111 mmol/L   CO2 27 22 - 32 mmol/L   Glucose, Bld 103 (H) 70 - 99 mg/dL    Comment: Glucose reference range applies only to samples taken after fasting for at least 8 hours.   BUN 14 8 - 23 mg/dL   Creatinine, Ser 0.84 0.61 - 1.24 mg/dL   Calcium 9.0 8.9 - 10.3 mg/dL   Total Protein 7.2 6.5 - 8.1 g/dL   Albumin 3.8 3.5 - 5.0 g/dL   AST 24 15 - 41 U/L   ALT 23 0 - 44 U/L   Alkaline Phosphatase 89 38 - 126 U/L   Total Bilirubin 0.7 0.3 - 1.2 mg/dL   GFR, Estimated >60 >60 mL/min    Comment: (NOTE) Calculated using the CKD-EPI Creatinine Equation (2021)    Anion gap 9 5 - 15    Comment: Performed at [facility], [place] [address] [place], [place] [address]  Lipase, blood     Status: None   Collection Time: 12/05/20  5:20 PM  Result Value Ref Range   Lipase 27 11 - 51 U/L    Comment: Performed at [facility], [place] [address] [place], [place] [address]  Resp Panel by RT-PCR (Flu A&B, Covid) Nasopharyngeal Swab     Status: None   Collection Time: 12/05/20  5:20 PM   Specimen: Nasopharyngeal Swab; Nasopharyngeal(NP) swabs in vial transport  medium  Result Value Ref Range   SARS Coronavirus 2 by RT PCR NEGATIVE NEGATIVE    Comment: (NOTE) SARS-CoV-2 target nucleic acids are NOT DETECTED.  The SARS-CoV-2 RNA is generally detectable in upper respiratory specimens during the acute phase of infection. The lowest concentration of SARS-CoV-2 viral copies this assay can detect is 138 copies/mL. A negative result does not preclude SARS-Cov-2 infection and should not be used as the sole basis for treatment or other patient management decisions.  A negative result may occur with  improper specimen collection/handling, submission of specimen other than nasopharyngeal swab, presence of viral mutation(s) within the areas targeted by this assay, and inadequate number of viral copies(<138 copies/mL). A negative result must be combined with clinical observations, patient history, and epidemiological information. The expected result is Negative.  Fact Sheet for Patients:  EntrepreneurPulse.com.au  Fact Sheet for Healthcare Providers:  IncredibleEmployment.be  This test is no t yet approved or cleared by the [place] FDA and  has been authorized for detection and/or diagnosis of SARS-CoV-2 by FDA under an Emergency Use Authorization (EUA). This EUA will remain  in effect (meaning this test can be used) for the duration of the COVID-19 declaration under Section 564(b)(1) of the Act, 21 U.S.C.section 360bbb-3(b)(1), unless the authorization is terminated  or revoked sooner.       Influenza A by PCR NEGATIVE NEGATIVE   Influenza B by PCR NEGATIVE NEGATIVE    Comment: (NOTE) The Xpert Xpress SARS-CoV-2/FLU/RSV plus assay is intended as an aid in the diagnosis of influenza from Nasopharyngeal swab specimens and should not be used as a sole basis for treatment. Nasal washings and aspirates are unacceptable for Xpert Xpress SARS-CoV-2/FLU/RSV testing.  Fact Sheet for  Patients: EntrepreneurPulse.com.au  Fact Sheet for Healthcare Providers: IncredibleEmployment.be  This test is not yet approved or cleared by the [place] FDA and has been authorized for detection and/or diagnosis of SARS-CoV-2 by FDA under an Emergency Use Authorization (EUA). This EUA will remain in effect (meaning this test can be used) for the duration of the COVID-19 declaration under Section 564(b)(1) of the Act, 21 U.S.C. section 360bbb-3(b)(1), unless the authorization is terminated or revoked.  Performed at [facility], [place] [address] [place], [place] [address]   Lactic acid, plasma     Status: None   Collection Time: 12/05/20  5:45 PM  Result Value Ref Range   Lactic Acid, Venous 1.0 0.5 - 1.9 mmol/L    Comment: Performed at [facility], [place] [address] [place], [place] [address]  Magnesium     Status: Abnormal   Collection Time: 12/05/20  7:00 PM  Result Value Ref Range   Magnesium 1.6 (L) 1.7 - 2.4 mg/dL    Comment: Performed at [facility], [place] [address] [place], [place] [address]  Lactic acid, plasma     Status: None   Collection Time: 12/05/20  9:52 PM  Result Value Ref Range   Lactic Acid, Venous 1.0 0.5 - 1.9 mmol/L    Comment: Performed at [facility], [place] [address], [place], [place] [address]  CBC     Status: Abnormal   Collection Time: 12/06/20  4:55 AM  Result Value Ref Range   WBC 8.5 4.0 - 10.5 K/uL   RBC 3.75 (L) 4.22 - 5.81 MIL/uL   Hemoglobin 11.8 (L) 13.0 - 17.0 g/dL   HCT 34.8 (L) 39.0 - 52.0 %   MCV 92.8 80.0 - 100.0 fL   MCH 31.5 26.0 - 34.0 pg   MCHC 33.9 30.0 - 36.0 g/dL   RDW 13.3 11.5 - 15.5 %   Platelets 158 150 - 400 K/uL   nRBC 0.0 0.0 - 0.2 %    Comment: Performed at [facility], [place] [address], [place], [place] [address]  Comprehensive metabolic panel     Status: Abnormal   Collection Time:  12/06/20  4:55 AM  Result Value Ref Range   Sodium  138 135 - 145 mmol/L   Potassium 3.9 3.5 - 5.1 mmol/L   Chloride 106 98 - 111 mmol/L   CO2 29 22 - 32 mmol/L   Glucose, Bld 99 70 - 99 mg/dL    Comment: Glucose reference range applies only to samples taken after fasting for at least 8 hours.   BUN 13 8 - 23 mg/dL   Creatinine, Ser 0.95 0.61 - 1.24 mg/dL   Calcium 8.4 (L) 8.9 - 10.3 mg/dL   Total Protein 6.2 (L) 6.5 - 8.1 g/dL   Albumin 3.2 (L) 3.5 - 5.0 g/dL   AST 19 15 - 41 U/L   ALT 19 0 - 44 U/L   Alkaline Phosphatase 71 38 - 126 U/L   Total Bilirubin 1.1 0.3 - 1.2 mg/dL   GFR, Estimated >60 >60 mL/min    Comment: (NOTE) Calculated using the CKD-EPI Creatinine Equation (2021)    Anion gap 3 (L) 5 - 15    Comment: Performed at [facility], [place] [address], [place], [place] [address]   CT ABDOMEN PELVIS W CONTRAST  Result Date: 12/05/2020 CLINICAL DATA:  74 year old male with abdominal pain. EXAM: CT ABDOMEN AND PELVIS WITH CONTRAST TECHNIQUE: Multidetector CT imaging of the abdomen and pelvis was performed using the standard protocol following bolus administration of intravenous contrast. CONTRAST:  156mL OMNIPAQUE IOHEXOL 300 MG/ML  SOLN COMPARISON:  Abdominal ultrasound dated 12/05/2020. FINDINGS: Lower chest: The visualized lung bases are clear. No intra-abdominal free air or free fluid. Hepatobiliary: The liver is unremarkable. There is mild intrahepatic biliary dilatation or mild periportal edema. The gallbladder contains sludge or noncalcified stones. There is a 4 mm stone in the neck of the gallbladder. There is pericholecystic fluid and diffuse gallbladder wall edema. Findings are most consistent with acute cholecystitis. Clinical correlation is recommended. Pancreas: Unremarkable. No pancreatic ductal dilatation or surrounding inflammatory changes. Spleen: Normal in size without focal abnormality. Adrenals/Urinary Tract: The adrenal glands unremarkable. There  is no hydronephrosis on either side. There is symmetric enhancement and excretion of contrast by both kidneys. Left renal parapelvic cysts. The visualized ureters and urinary bladder appear unremarkable. Stomach/Bowel: There is moderate stool throughout the colon. There is no bowel obstruction or active inflammation. The appendix is normal. Vascular/Lymphatic: Mild aortoiliac atherosclerotic disease. The IVC is unremarkable. No portal venous gas. Left para-aortic/retroperitoneal adenopathy measuring up to 2.6 x 2.2 cm of indeterminate etiology but concerning for malignancy/metastatic disease. There is a trace fluid or a mildly enlarged nodule/lymph node or serosal implant along the inferior aspect of the cecum measuring 13 x 9 mm (coronal 50/5 and axial 53/2). Reproductive: The prostate and seminal vesicles are grossly unremarkable. Other: None Musculoskeletal: Osteopenia with degenerative changes of the spine. No acute osseous pathology. IMPRESSION: 1. Cholelithiasis with findings most consistent with acute cholecystitis. Clinical correlation is recommended. 2. Left para-aortic/retroperitoneal adenopathy of indeterminate etiology but concerning for malignancy/metastatic disease. 3. Moderate colonic stool burden. No bowel obstruction. Normal appendix. 4. Aortic Atherosclerosis (ICD10-I70.0). Electronically Signed   By: Anner Crete M.D.   On: 12/05/2020 22:52   US Abdomen Limited RUQ (LIVER/GB)  Result Date: 12/05/2020 CLINICAL DATA:  Right upper quadrant pain EXAM: ULTRASOUND ABDOMEN LIMITED RIGHT UPPER QUADRANT COMPARISON:  None. FINDINGS: Gallbladder: No gallstones. Sludge is present. Wall thickening measuring up to 1 cm, some of which is masslike in appearance. Pericholecystic fluid is present. No sonographic Murphy sign noted by sonographer. Common bile duct: Diameter: 5 mm, normal Liver: No focal lesion identified. Within  normal limits in parenchymal echogenicity. Mild intrahepatic duct dilatation.  Portal vein is patent on color Doppler imaging with normal direction of blood flow towards the liver. Other: None. IMPRESSION: Abnormal appearance of gallbladder raising possibility of acute cholecystitis. However, there is no sonographic Murphy sign and some of the wall thickening is masslike in appearance. Recommend contrast enhanced CT or MRI for further evaluation. Mild intrahepatic duct dilatation. Electronically Signed   By: Macy Mis M.D.   On: 12/05/2020 18:41    Assessment/Plan Cholecystitis with gallbladder sludge, cholelithiasis  IV Zosyn started  Allow clear liquid diet today - NPO after midnight  Plan lap chole with IOC tomorrow Retroperitoneal lymphadenopathy  Additional work up per medical service  MRI scheduled for later today  Will evaluate peritoneal cavity at the time of cholecystectomy - possible biopsy  Discussed lap chole with IOC with patient.  Discussed possible need for open surgery.  Discussed hospital stay and post op recovery.  The risks and benefits of the procedure have been discussed at length with the patient.  The patient understands the proposed procedure, potential alternative treatments, and the course of recovery to be expected.  All of the patient's questions have been answered at this time.  The patient wishes to proceed with surgery.  Armandina Gemma, MD [facility] Office: [phone]  [facility] 12/06/2020, 6:57 AM Please see Amion for pager number during day hours 7:00am-4:30pm

## 2020-12-07 ENCOUNTER — Inpatient Hospital Stay (HOSPITAL_COMMUNITY): Payer: Medicare Other

## 2020-12-07 ENCOUNTER — Encounter (HOSPITAL_COMMUNITY)
Admission: EM | Disposition: A | Discharge status: Home / Self Care | Payer: Self-pay | Source: Home / Self Care | Attending: Internal Medicine

## 2020-12-07 ENCOUNTER — Encounter (HOSPITAL_COMMUNITY): Payer: Self-pay | Admitting: Internal Medicine

## 2020-12-07 ENCOUNTER — Inpatient Hospital Stay (HOSPITAL_COMMUNITY): Payer: Medicare Other | Admitting: Certified Registered Nurse Anesthetist

## 2020-12-07 DIAGNOSIS — R591 Generalized enlarged lymph nodes: Secondary | ICD-10-CM

## 2020-12-07 DIAGNOSIS — E876 Hypokalemia: Secondary | ICD-10-CM

## 2020-12-07 HISTORY — PX: CHOLECYSTECTOMY: SHX55

## 2020-12-07 LAB — COMPREHENSIVE METABOLIC PANEL
ALT: 17 U/L (ref 0–44)
AST: 20 U/L (ref 15–41)
Albumin: 3.2 g/dL — ABNORMAL LOW (ref 3.5–5.0)
Alkaline Phosphatase: 62 U/L (ref 38–126)
Anion gap: 6 (ref 5–15)
BUN: 10 mg/dL (ref 8–23)
CO2: 25 mmol/L (ref 22–32)
Calcium: 8.3 mg/dL — ABNORMAL LOW (ref 8.9–10.3)
Chloride: 108 mmol/L (ref 98–111)
Creatinine, Ser: 0.98 mg/dL (ref 0.61–1.24)
GFR, Estimated: >60 mL/min (ref >60)
Glucose, Bld: 95 mg/dL (ref 70–99)
Potassium: 4 mmol/L (ref 3.5–5.1)
Sodium: 139 mmol/L (ref 135–145)
Total Bilirubin: 1 mg/dL (ref 0.3–1.2)
Total Protein: 6 g/dL — ABNORMAL LOW (ref 6.5–8.1)

## 2020-12-07 LAB — CBC
HCT: 35 % — ABNORMAL LOW (ref 39.0–52.0)
Hemoglobin: 11.8 g/dL — ABNORMAL LOW (ref 13.0–17.0)
MCH: 31.8 pg (ref 26.0–34.0)
MCHC: 33.7 g/dL (ref 30.0–36.0)
MCV: 94.3 fL (ref 80.0–100.0)
Platelets: 149 10*3/uL — ABNORMAL LOW (ref 150–400)
RBC: 3.71 MIL/uL — ABNORMAL LOW (ref 4.22–5.81)
RDW: 13.3 % (ref 11.5–15.5)
WBC: 6.4 10*3/uL (ref 4.0–10.5)
nRBC: 0 % (ref 0.0–0.2)

## 2020-12-07 IMAGING — RF DG CHOLANGIOGRAM OPERATIVE
1 series · 4 of 4 positions shown · non-contrast
Comparison: MRI dated [DATE].

CLINICAL DATA: Intraoperative cholangiogram.

EXAM:
INTRAOPERATIVE CHOLANGIOGRAM
TECHNIQUE: Cholangiographic images from the C-arm fluoroscopic device were
submitted for interpretation post-operatively. Please see the
procedural report for the amount of contrast and the fluoroscopy
time utilized.

[Series 1: run · 4 of 121 frames shown]
[frame 19/121]
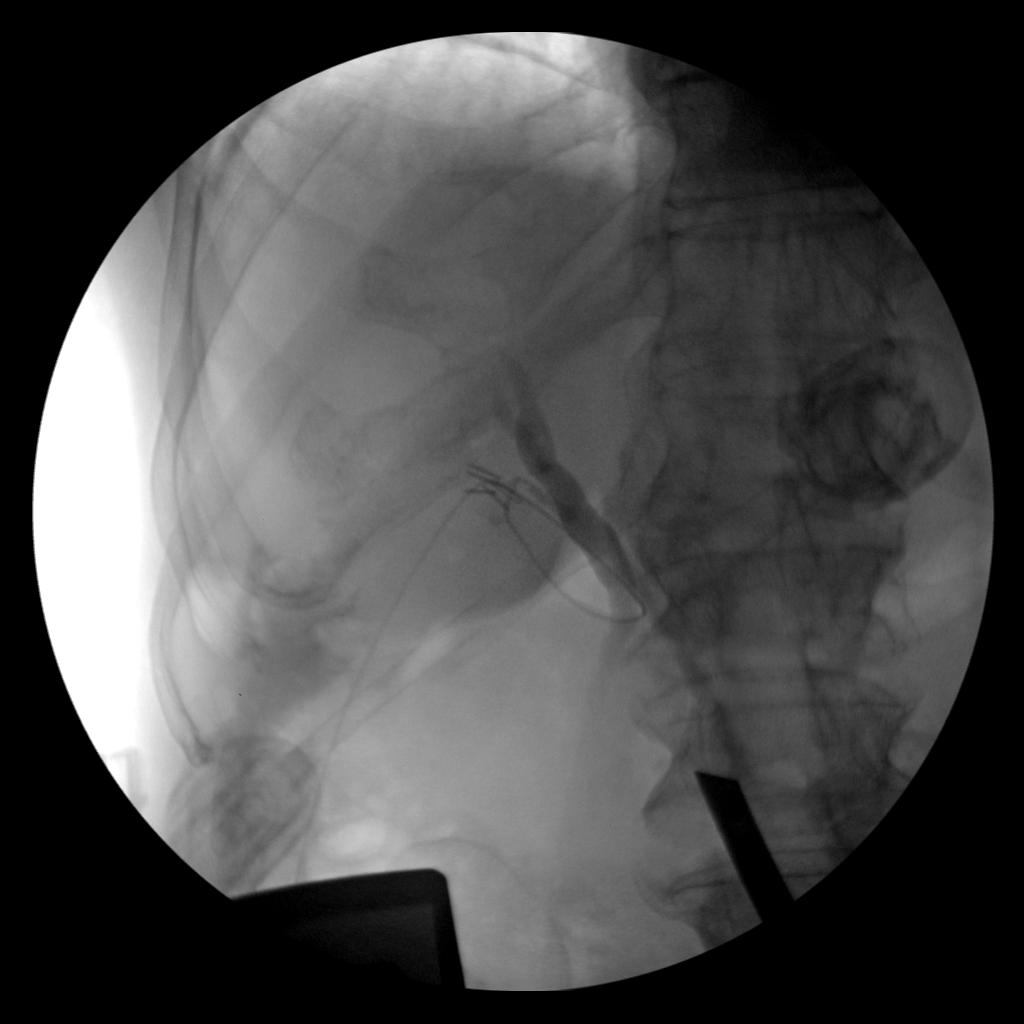
[frame 61/121]
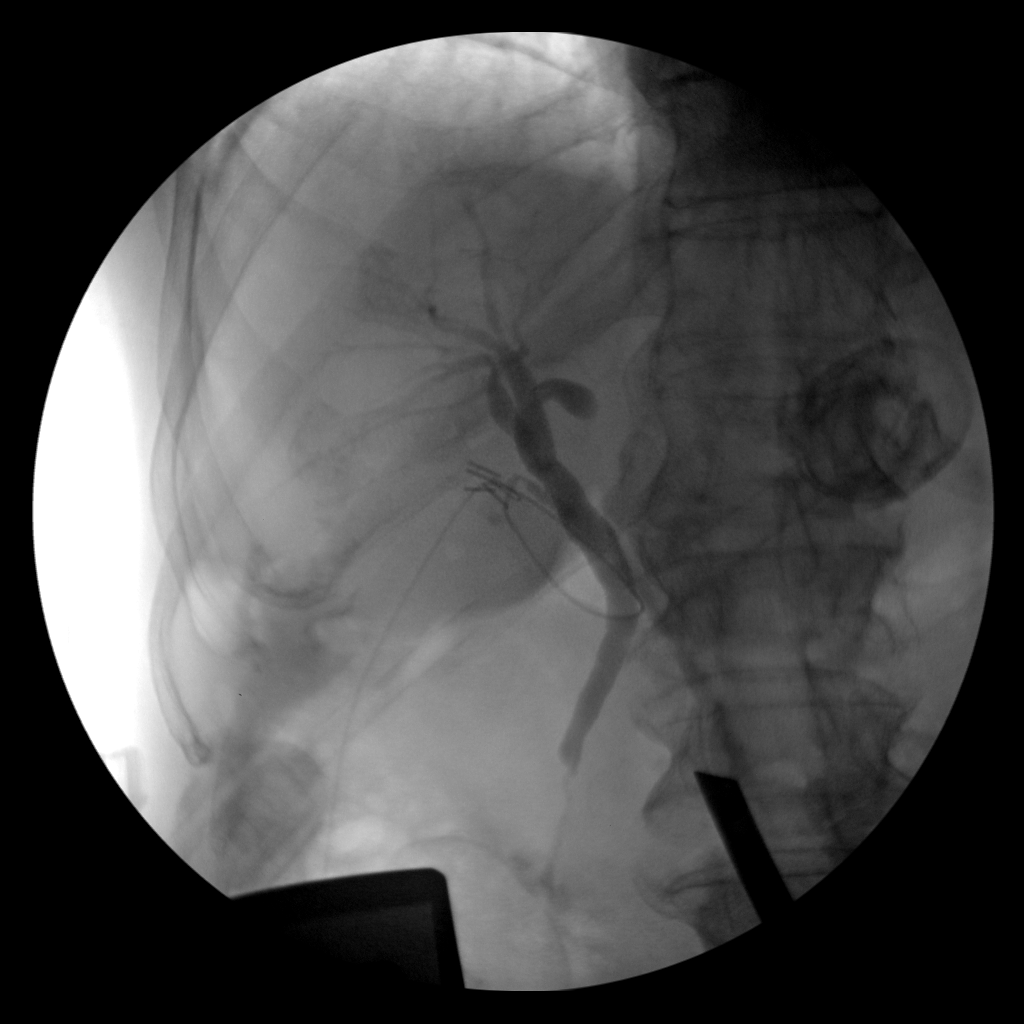
[frame 71/121]
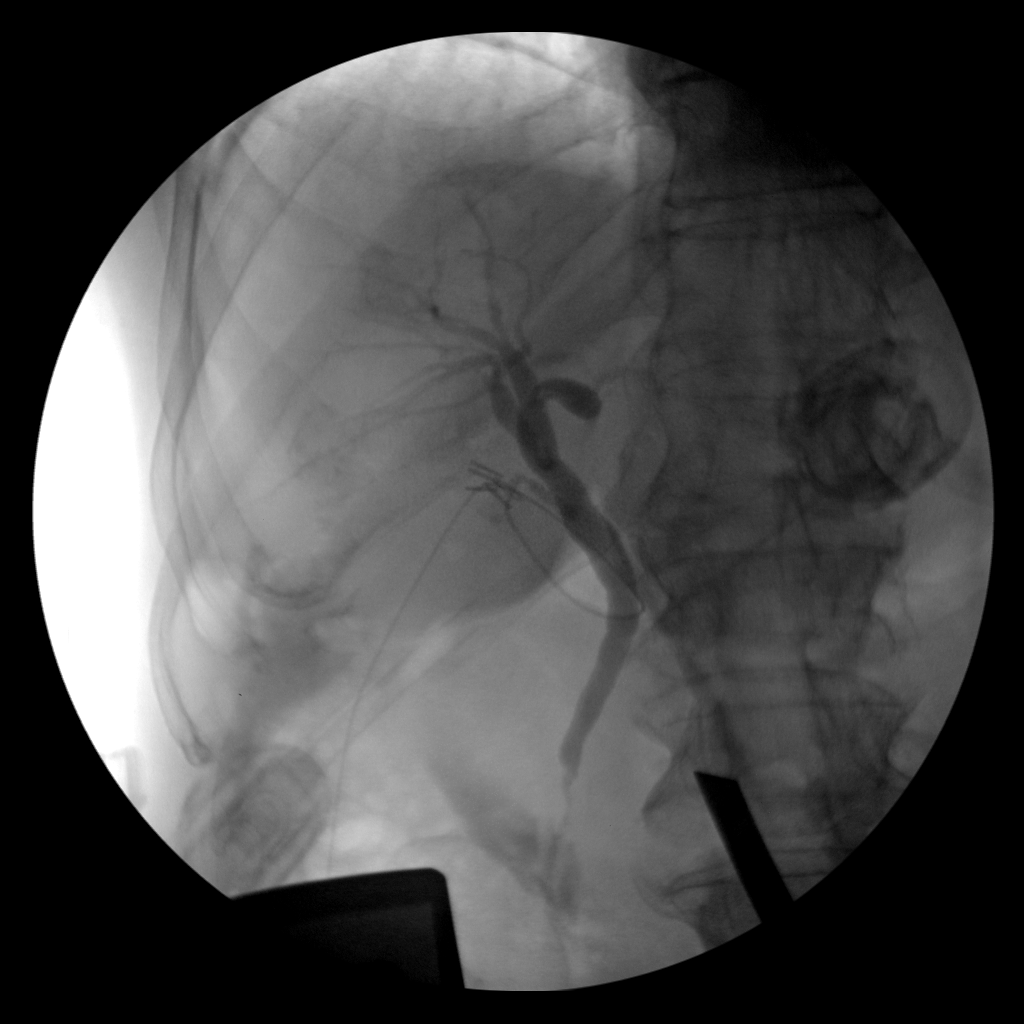
[frame 103/121]
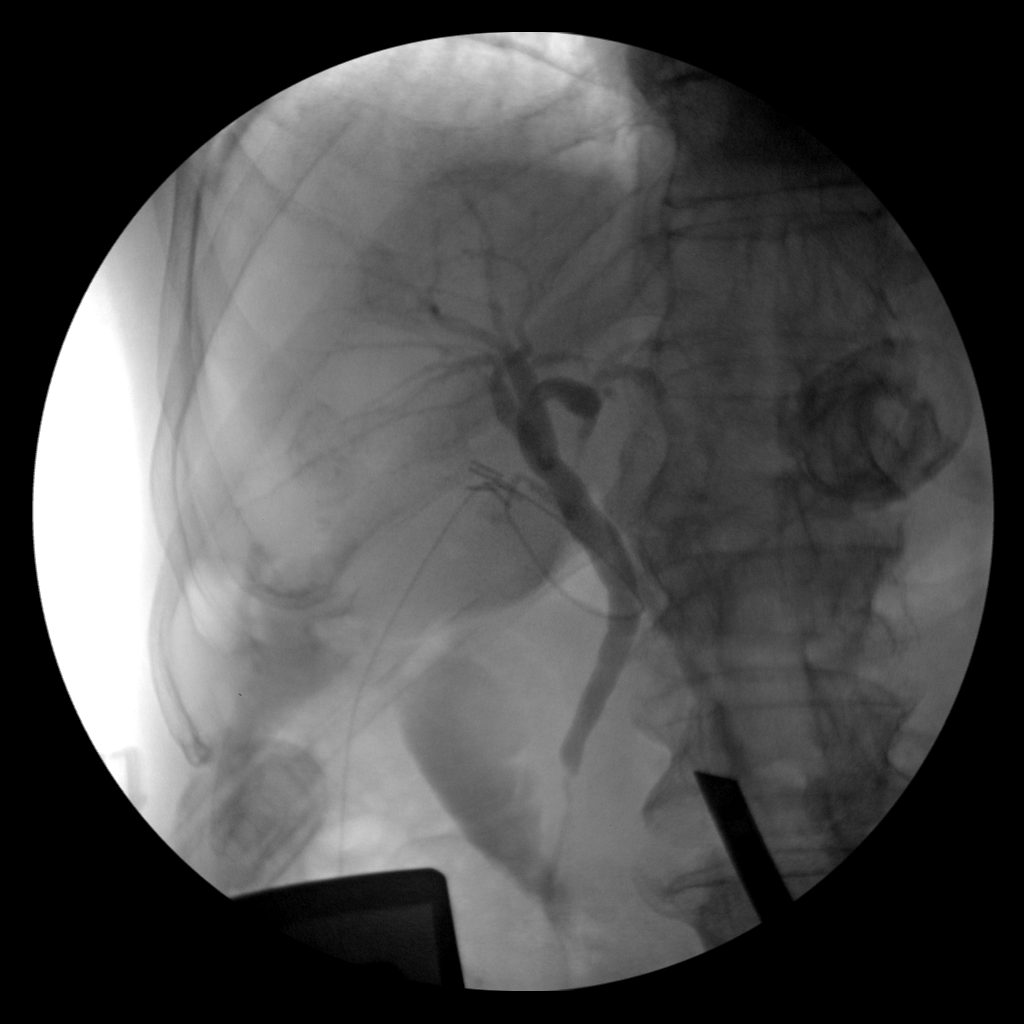

[4 of 4 positions shown; findings below may reference images not displayed]

FINDINGS: The patient has undergone intraoperative cholangiogram. Injection of
contrast opacifies the biliary tree. There is some mild biliary
ductal dilatation. No definite filling defect was identified. There
is contrast extending into the duodenum. A single cine clip was
submitted for evaluation.
IMPRESSION: Status post intraoperative cholangiogram without definite evidence
for choledocholithiasis.

## 2020-12-07 IMAGING — US US SCROTUM
1 series · 13 of 25 positions shown · non-contrast
Comparison: None.

CLINICAL DATA: Left para-aortic lymphadenopathy on CT, reported
history of lymphoma

EXAM:
ULTRASOUND OF SCROTUM
TECHNIQUE: Complete ultrasound examination of the testicles, epididymis, and
other scrotal structures was performed.

[Series 1: us scrotum · 45 acquisitions, 13 frames shown]
[im 1/45]
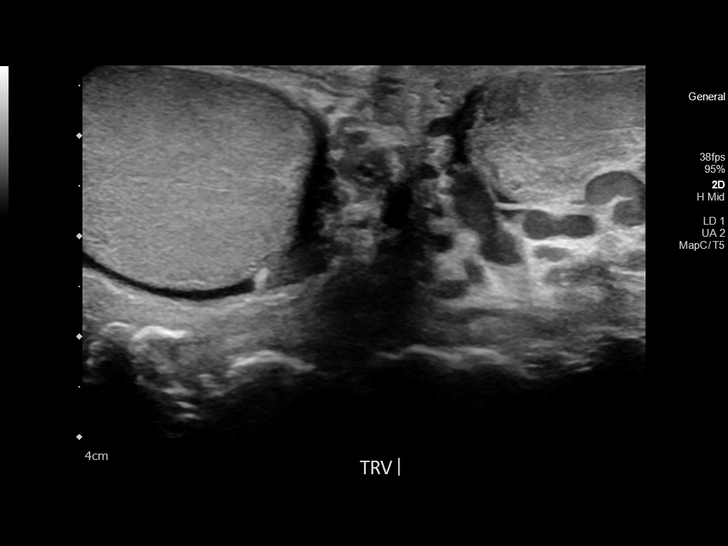
[im 4/45]
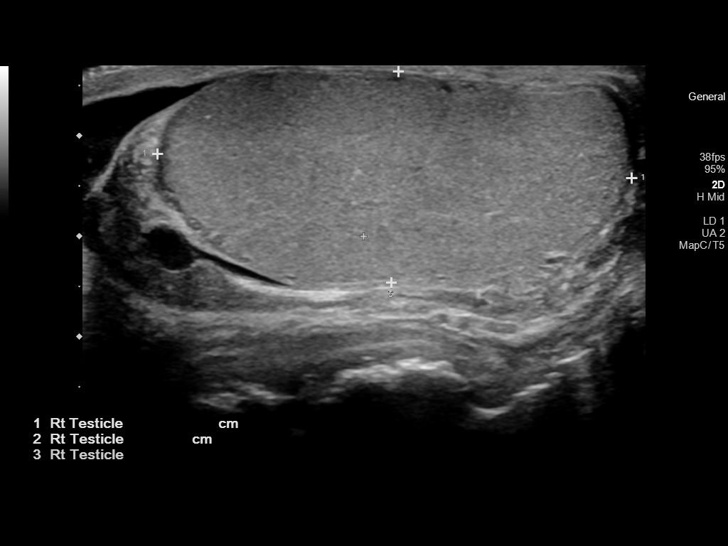
[im 8/45]
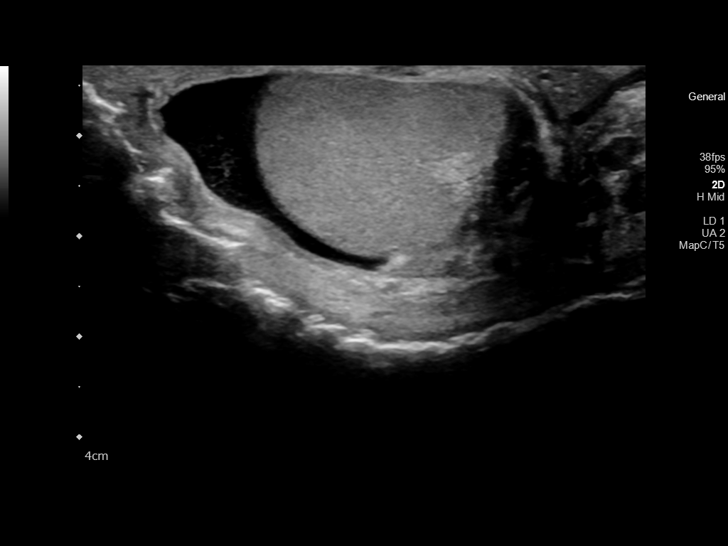
[im 12/45]
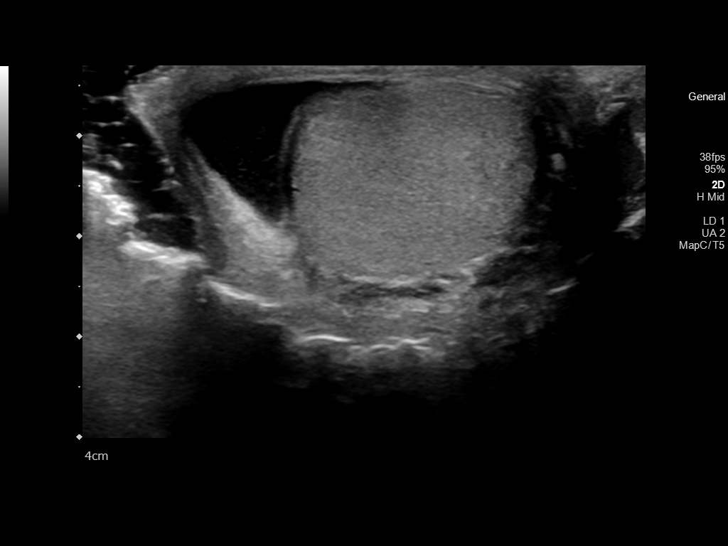
[im 15/45]
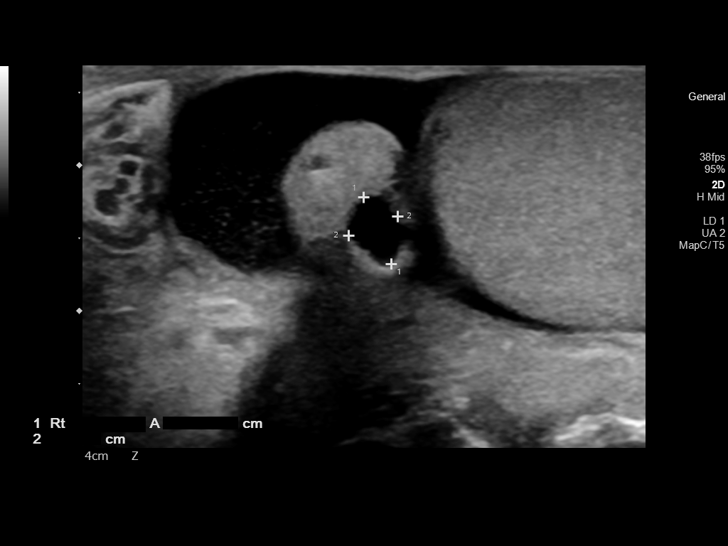
[im 19/45]
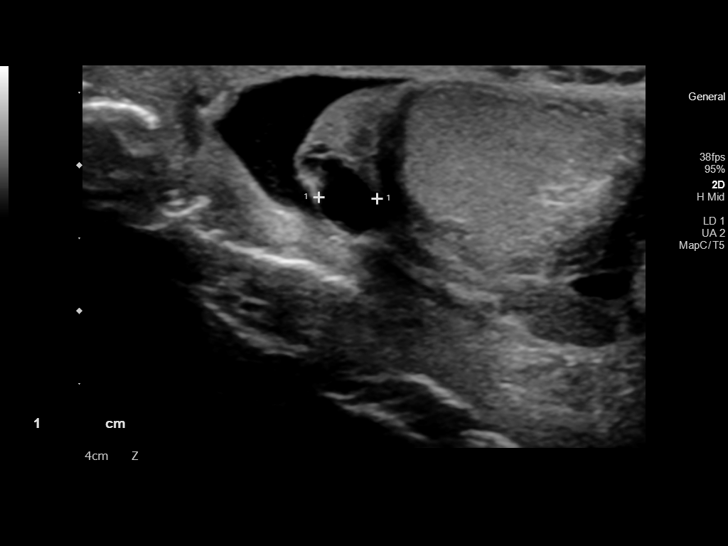
[im 23/45]
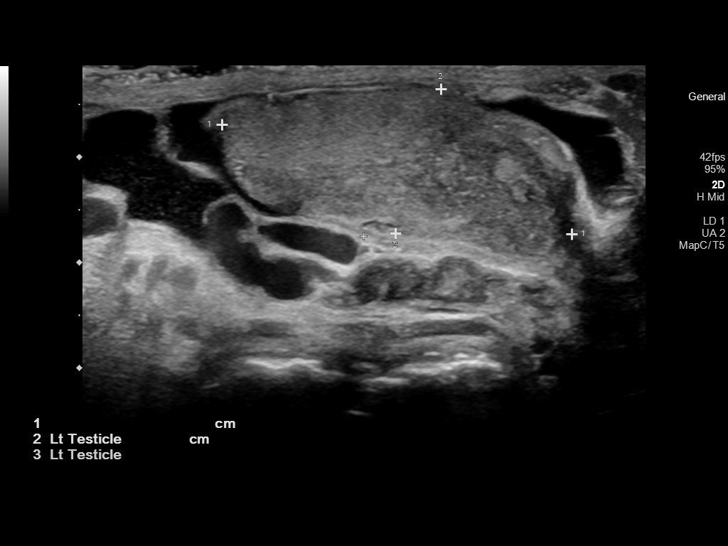
[im 26/45]
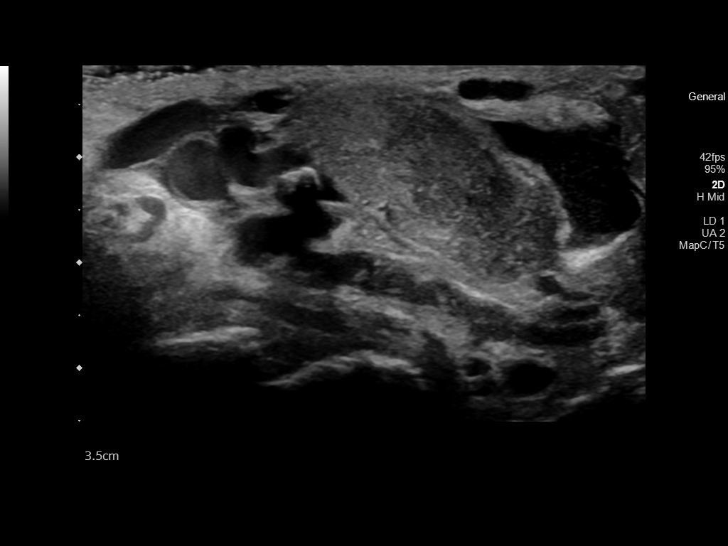
[im 30/45]
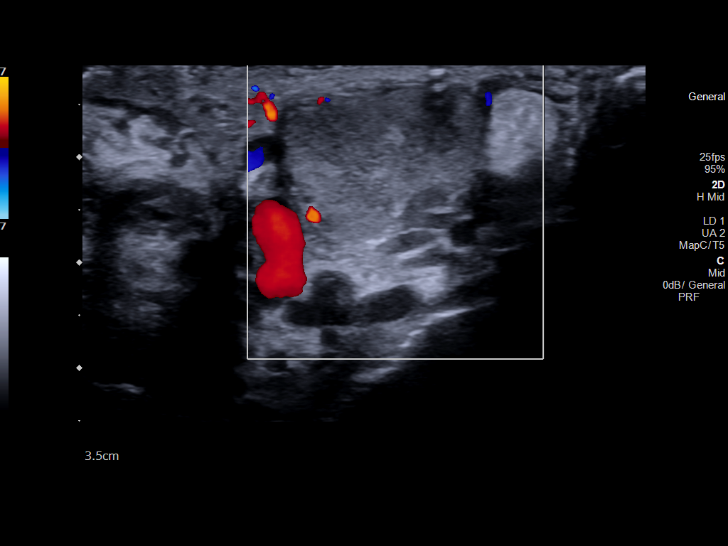
[im 34/45]
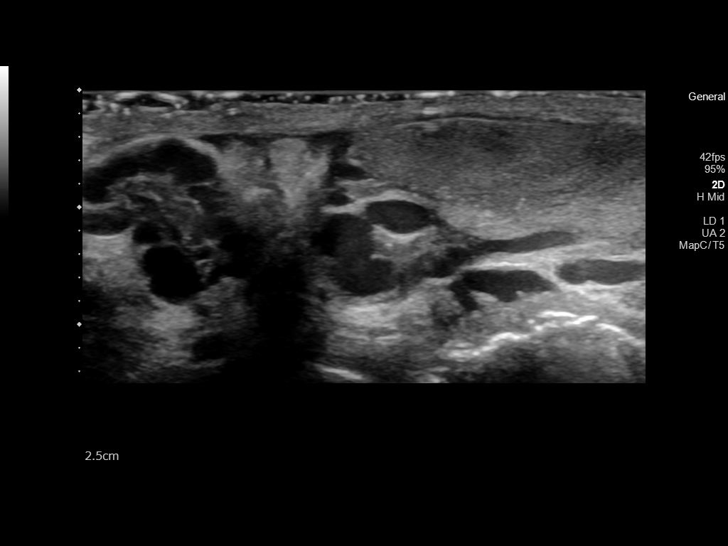
[im 37/45]
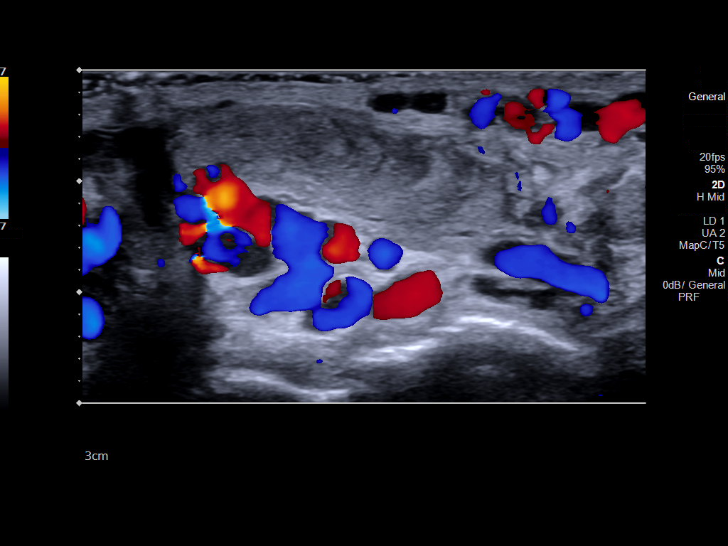
[im 41/45]
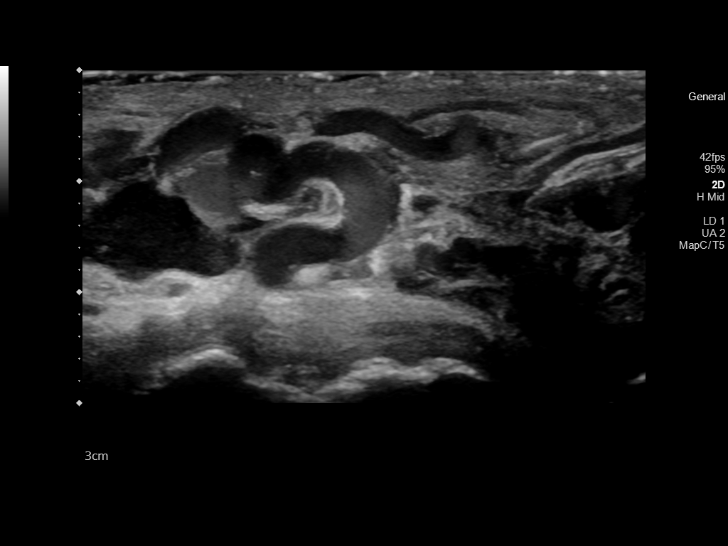
[im 45/45]
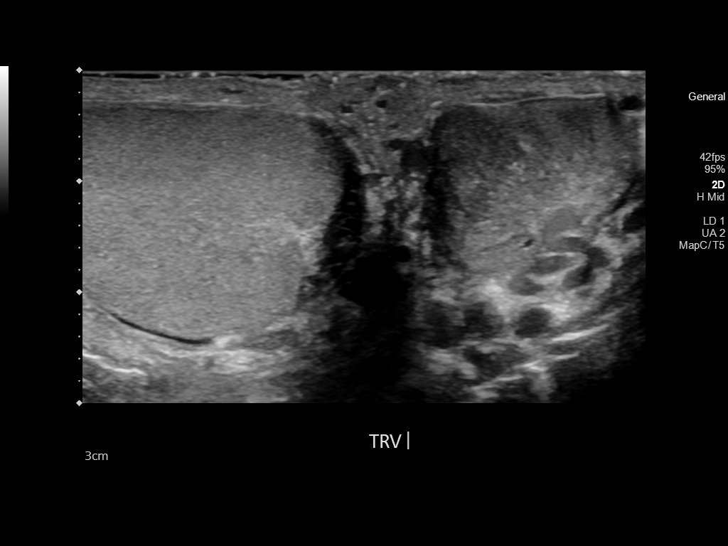

[13 of 25 positions shown; findings below may reference images not displayed]

FINDINGS: Right testicle

Measurements: 4.7 x 2.1 x 3.2 cm. No mass or microlithiasis
visualized.

Left testicle

Measurements: 3.5 x 1.4 by 1.9 cm. There is a heterogeneous area of
decreased echotexture in the lower pole of the left testicle
measuring 1.6 x 1.1 by 1.3 cm. There is no significant vascularity
associated with this hypoechoic masslike region. Given the CT
findings and clinical history, primary testicular malignancy versus
intra testicular lymphoma are the leading diagnostic
considerations.

Right epididymis: Normal in size, with incidental 5 mm epididymal
cyst of doubtful clinical significance.

Left epididymis:  Normal in size and appearance.

Hydrocele:  There are small bilateral hydroceles.

Varicocele:  Large left varicocele.
IMPRESSION: 1. Atrophic left testicle, with hypoechoic masslike area involving
the lower pole. Given clinical history and CT findings, diagnostic
considerations include primary testicular malignancy versus intra
testicular lymphoma.
2. Large left varicocele.
3. Small bilateral hydroceles.

## 2020-12-07 SURGERY — LAPAROSCOPIC CHOLECYSTECTOMY WITH INTRAOPERATIVE CHOLANGIOGRAM
Anesthesia: General | Site: Abdomen

## 2020-12-07 MED ORDER — FENTANYL CITRATE (PF) 100 MCG/2ML IJ SOLN
INTRAMUSCULAR | Status: DC | PRN
  Administered 2020-12-07: 50 ug via INTRAVENOUS
  Administered 2020-12-07: 100 ug via INTRAVENOUS
  Administered 2020-12-07 (×3): 50 ug via INTRAVENOUS

## 2020-12-07 MED ORDER — FENTANYL CITRATE (PF) 100 MCG/2ML IJ SOLN
INTRAMUSCULAR | Status: AC
  Filled 2020-12-07: qty 2

## 2020-12-07 MED ORDER — LACTATED RINGERS IR SOLN
Status: DC | PRN
  Administered 2020-12-07: 1000 mL

## 2020-12-07 MED ORDER — LIDOCAINE 2% (20 MG/ML) 5 ML SYRINGE
INTRAMUSCULAR | Status: DC | PRN
  Administered 2020-12-07: 60 mg via INTRAVENOUS

## 2020-12-07 MED ORDER — OXYCODONE HCL 5 MG PO TABS: 5.0000 mg | ORAL_TABLET | ORAL | Status: DC | PRN

## 2020-12-07 MED ORDER — ENSURE PRE-SURGERY PO LIQD
296.0000 mL | Freq: Once | ORAL | Status: AC
  Administered 2020-12-07: 07:00:00 296 mL via ORAL
  Filled 2020-12-07: qty 296

## 2020-12-07 MED ORDER — ROCURONIUM BROMIDE 10 MG/ML (PF) SYRINGE
PREFILLED_SYRINGE | INTRAVENOUS | Status: DC | PRN
  Administered 2020-12-07: 60 mg via INTRAVENOUS

## 2020-12-07 MED ORDER — ONDANSETRON HCL 4 MG/2ML IJ SOLN
INTRAMUSCULAR | Status: AC
  Filled 2020-12-07: qty 2

## 2020-12-07 MED ORDER — HYDROMORPHONE HCL 1 MG/ML IJ SOLN: 0.5000 mg | INTRAMUSCULAR | Status: DC | PRN

## 2020-12-07 MED ORDER — DEXAMETHASONE SODIUM PHOSPHATE 10 MG/ML IJ SOLN
INTRAMUSCULAR | Status: AC
  Filled 2020-12-07: qty 1

## 2020-12-07 MED ORDER — ONDANSETRON HCL 4 MG/2ML IJ SOLN: 4.0000 mg | Freq: Once | INTRAMUSCULAR | Status: DC | PRN

## 2020-12-07 MED ORDER — ACETAMINOPHEN 500 MG PO TABS
1000.0000 mg | ORAL_TABLET | Freq: Three times a day (TID) | ORAL | Status: DC
  Administered 2020-12-07 – 2020-12-08 (×3): 1000 mg via ORAL
  Filled 2020-12-07 (×3): qty 2

## 2020-12-07 MED ORDER — 0.9 % SODIUM CHLORIDE (POUR BTL) OPTIME
TOPICAL | Status: DC | PRN
Start: 2020-12-07 — End: 2020-12-07
  Administered 2020-12-07: 13:00:00 1000 mL

## 2020-12-07 MED ORDER — IOHEXOL 300 MG/ML  SOLN
INTRAMUSCULAR | Status: DC | PRN
  Administered 2020-12-07: 13:00:00 7.5 mL

## 2020-12-07 MED ORDER — LACTATED RINGERS IV SOLN: INTRAVENOUS | Status: DC | PRN

## 2020-12-07 MED ORDER — BUPIVACAINE-EPINEPHRINE (PF) 0.25% -1:200000 IJ SOLN
INTRAMUSCULAR | Status: AC
  Filled 2020-12-07: qty 30

## 2020-12-07 MED ORDER — EPHEDRINE SULFATE-NACL 50-0.9 MG/10ML-% IV SOSY
PREFILLED_SYRINGE | INTRAVENOUS | Status: DC | PRN
  Administered 2020-12-07: 5 mg via INTRAVENOUS

## 2020-12-07 MED ORDER — DEXAMETHASONE SODIUM PHOSPHATE 4 MG/ML IJ SOLN
INTRAMUSCULAR | Status: DC | PRN
  Administered 2020-12-07: 5 mg via INTRAVENOUS

## 2020-12-07 MED ORDER — OXYCODONE HCL 5 MG PO TABS: 5.0000 mg | ORAL_TABLET | Freq: Once | ORAL | Status: DC | PRN

## 2020-12-07 MED ORDER — FENTANYL CITRATE (PF) 100 MCG/2ML IJ SOLN
INTRAMUSCULAR | Status: AC
  Administered 2020-12-07: 14:00:00 50 ug via INTRAVENOUS
  Filled 2020-12-07: qty 2

## 2020-12-07 MED ORDER — PROPOFOL 10 MG/ML IV BOLUS
INTRAVENOUS | Status: AC
  Filled 2020-12-07: qty 20

## 2020-12-07 MED ORDER — OXYCODONE HCL 5 MG/5ML PO SOLN
5.0000 mg | Freq: Once | ORAL | Status: DC | PRN
Start: 2020-12-07 — End: 2020-12-07

## 2020-12-07 MED ORDER — FENTANYL CITRATE (PF) 100 MCG/2ML IJ SOLN
25.0000 ug | INTRAMUSCULAR | Status: DC | PRN
Start: 2020-12-07 — End: 2020-12-07
  Administered 2020-12-07: 50 ug via INTRAVENOUS

## 2020-12-07 MED ORDER — LIDOCAINE HCL (PF) 2 % IJ SOLN
INTRAMUSCULAR | Status: AC
  Filled 2020-12-07: qty 5

## 2020-12-07 MED ORDER — PROPOFOL 10 MG/ML IV BOLUS
INTRAVENOUS | Status: DC | PRN
  Administered 2020-12-07: 140 mg via INTRAVENOUS

## 2020-12-07 MED ORDER — ONDANSETRON HCL 4 MG/2ML IJ SOLN
INTRAMUSCULAR | Status: DC | PRN
  Administered 2020-12-07: 4 mg via INTRAVENOUS

## 2020-12-07 MED ORDER — SUGAMMADEX SODIUM 200 MG/2ML IV SOLN
INTRAVENOUS | Status: DC | PRN
  Administered 2020-12-07: 200 mg via INTRAVENOUS

## 2020-12-07 MED ORDER — EPHEDRINE 5 MG/ML INJ
INTRAVENOUS | Status: AC
  Filled 2020-12-07: qty 10

## 2020-12-07 MED ORDER — BUPIVACAINE-EPINEPHRINE (PF) 0.25% -1:200000 IJ SOLN
INTRAMUSCULAR | Status: DC | PRN
  Administered 2020-12-07: 27 mL

## 2020-12-07 MED ORDER — PHENYLEPHRINE 40 MCG/ML (10ML) SYRINGE FOR IV PUSH (FOR BLOOD PRESSURE SUPPORT)
PREFILLED_SYRINGE | INTRAVENOUS | Status: DC | PRN
  Administered 2020-12-07: 80 ug via INTRAVENOUS

## 2020-12-07 MED ORDER — ROCURONIUM BROMIDE 10 MG/ML (PF) SYRINGE
PREFILLED_SYRINGE | INTRAVENOUS | Status: AC
  Filled 2020-12-07: qty 10

## 2020-12-07 MED ORDER — PHENYLEPHRINE 40 MCG/ML (10ML) SYRINGE FOR IV PUSH (FOR BLOOD PRESSURE SUPPORT)
PREFILLED_SYRINGE | INTRAVENOUS | Status: AC
  Filled 2020-12-07: qty 10

## 2020-12-07 SURGICAL SUPPLY — 38 items
ADH SKN CLS APL DERMABOND .7 (GAUZE/BANDAGES/DRESSINGS) ×1
APPLIER CLIP ROT 10 11.4 M/L (STAPLE) ×3
APR CLP MED LRG 11.4X10 (STAPLE) ×1
BAG SPEC RTRVL LRG 6X4 10 (ENDOMECHANICALS) ×1
CABLE HIGH FREQUENCY MONO STRZ (ELECTRODE) ×3 IMPLANT
CHLORAPREP W/TINT 26 (MISCELLANEOUS) ×6 IMPLANT
CLIP APPLIE ROT 10 11.4 M/L (STAPLE) ×1 IMPLANT
CLOSURE WOUND 1/2 X4 (GAUZE/BANDAGES/DRESSINGS)
COVER MAYO STAND STRL (DRAPES) ×3 IMPLANT
COVER SURGICAL LIGHT HANDLE (MISCELLANEOUS) ×3 IMPLANT
COVER WAND RF STERILE (DRAPES) IMPLANT
DECANTER SPIKE VIAL GLASS SM (MISCELLANEOUS) ×3 IMPLANT
DERMABOND ADVANCED (GAUZE/BANDAGES/DRESSINGS) ×2
DERMABOND ADVANCED .7 DNX12 (GAUZE/BANDAGES/DRESSINGS) IMPLANT
DRAPE C-ARM 42X120 X-RAY (DRAPES) ×3 IMPLANT
ELECT REM PT RETURN 15FT ADLT (MISCELLANEOUS) ×3 IMPLANT
GAUZE SPONGE 2X2 8PLY STRL LF (GAUZE/BANDAGES/DRESSINGS) ×1 IMPLANT
GLOVE SURG ORTHO 8.0 STRL STRW (GLOVE) ×3 IMPLANT
GOWN STRL REUS W/TWL XL LVL3 (GOWN DISPOSABLE) ×6 IMPLANT
HEMOSTAT SURGICEL 4X8 (HEMOSTASIS) IMPLANT
KIT BASIN OR (CUSTOM PROCEDURE TRAY) ×3 IMPLANT
KIT TURNOVER KIT A (KITS) IMPLANT
PENCIL SMOKE EVACUATOR (MISCELLANEOUS) IMPLANT
POUCH SPECIMEN RETRIEVAL 10MM (ENDOMECHANICALS) ×3 IMPLANT
SCISSORS LAP 5X35 DISP (ENDOMECHANICALS) ×3 IMPLANT
SET CHOLANGIOGRAPH MIX (MISCELLANEOUS) ×3 IMPLANT
SET IRRIG TUBING LAPAROSCOPIC (IRRIGATION / IRRIGATOR) ×3 IMPLANT
SET TUBE SMOKE EVAC HIGH FLOW (TUBING) IMPLANT
SLEEVE XCEL OPT CAN 5 100 (ENDOMECHANICALS) ×3 IMPLANT
SPONGE GAUZE 2X2 STER 10/PKG (GAUZE/BANDAGES/DRESSINGS) ×2
STRIP CLOSURE SKIN 1/2X4 (GAUZE/BANDAGES/DRESSINGS) IMPLANT
SUT MNCRL AB 4-0 PS2 18 (SUTURE) ×3 IMPLANT
TOWEL OR 17X26 10 PK STRL BLUE (TOWEL DISPOSABLE) ×3 IMPLANT
TOWEL OR NON WOVEN STRL DISP B (DISPOSABLE) ×3 IMPLANT
TRAY LAPAROSCOPIC (CUSTOM PROCEDURE TRAY) ×3 IMPLANT
TROCAR BLADELESS OPT 5 100 (ENDOMECHANICALS) ×3 IMPLANT
TROCAR XCEL BLUNT TIP 100MML (ENDOMECHANICALS) ×3 IMPLANT
TROCAR XCEL NON-BLD 11X100MML (ENDOMECHANICALS) ×3 IMPLANT

## 2020-12-07 NOTE — Anesthesia Procedure Notes (Signed)
Procedure Name: Intubation Date/Time: 12/07/2020 12:31 PM Performed by: Claudia Desanctis, CRNA Pre-anesthesia Checklist: Patient identified, Emergency Drugs available, Suction available and Patient being monitored Patient Re-evaluated:Patient Re-evaluated prior to induction Oxygen Delivery Method: Circle system utilized Preoxygenation: Pre-oxygenation with 100% oxygen Induction Type: IV induction Ventilation: Mask ventilation without difficulty Laryngoscope Size: 2 and Miller Grade View: Grade I Tube type: Oral Tube size: 7.5 mm Number of attempts: 1 Airway Equipment and Method: Stylet Placement Confirmation: ETT inserted through vocal cords under direct vision,  positive ETCO2 and breath sounds checked- equal and bilateral Secured at: 21 cm Tube secured with: Tape Dental Injury: Teeth and Oropharynx as per pre-operative assessment

## 2020-12-07 NOTE — Transfer of Care (Signed)
Immediate Anesthesia Transfer of Care Note  Patient: Arthur Guzman  Procedure(s) Performed: LAPAROSCOPIC CHOLECYSTECTOMY WITH INTRAOPERATIVE CHOLANGIOGRAM (N/A Abdomen)  Patient Location: PACU  Anesthesia Type:General  Level of Consciousness: awake and patient cooperative  Airway & Oxygen Therapy: Patient Spontanous Breathing and Patient connected to face mask  Post-op Assessment: Report given to RN and Post -op Vital signs reviewed and stable  Post vital signs: Reviewed and stable  Last Vitals:  Vitals Value Taken Time  BP 155/75 12/07/20 1347  Temp    Pulse 75 12/07/20 1349  Resp 17 12/07/20 1349  SpO2 100 % 12/07/20 1349  Vitals shown include unvalidated device data.  Last Pain:  Vitals:   12/07/20 1106  TempSrc:   PainSc: 0-No pain         Complications: No complications documented.

## 2020-12-07 NOTE — Op Note (Signed)
Procedure Note  Pre-operative Diagnosis:  Cholecystitis, cholelithiasis  Post-operative Diagnosis:  same  Surgeon:  Darnell Level, MD  Assistant:  Hedda Slade, PA-C   Procedure:  Laparoscopic cholecystectomy with intra-operative cholangiography  Anesthesia:  General  Estimated Blood Loss:  minimal  Drains: none         Specimen: gallbladder to pathology   Indications:  Patient is a 74 yo male with 4 day history of RUQ pain.  USN positive for cholecystitis, gallbladder sludge, and cholelithiasis.  Now for cholecystectomy.  Procedure Details:  The patient was seen in the pre-op holding area. The risks, benefits, complications, treatment options, and expected outcomes were previously discussed with the patient. The patient agreed with the proposed plan and has signed the informed consent form.  The patient was transported to operating room #1 at the [facility]. The patient was placed in the supine position on the operating room table. Following induction of general anesthesia, the abdomen was prepped and draped in the usual aseptic fashion.  An incision was made in the skin near the umbilicus. The midline fascia was incised and the peritoneal cavity was entered and a Hasson cannula was introduced under direct vision. The cannula was secured with a 0-Vicryl pursestring suture. Pneumoperitoneum was established with carbon dioxide. Additional cannulae were introduced under direct vision along the right costal margin in the midline, mid-clavicular line, and anterior axillary line.   The gallbladder was identified and was markedly edematous and thick-walled.  It was aspirated with the trocar and the fundus grasped and retracted cephalad. Adhesions were taken down bluntly and the electrocautery was utilized as needed, taking care not to involve any adjacent structures. The infundibulum was grasped and retracted laterally, exposing the peritoneum overlying the triangle of Calot. The  peritoneum was incised and structures exposed with blunt dissection. The cystic duct was clearly identified, bluntly dissected circumferentially, and clipped at the neck of the gallbladder.  An incision was made in the cystic duct and the cholangiogram catheter introduced. The catheter was secured using an ligaclip.  Real-time cholangiography was performed using C-arm fluoroscopy.  There was rapid filling of a normal caliber common bile duct.  There was reflux of contrast into the left and right hepatic ductal systems.  There was free flow distally into the duodenum without filling defect or obstruction.  The catheter was removed from the peritoneal cavity.  The cystic duct was then ligated with ligaclips and divided. The cystic artery was identified, dissected circumferentially, ligated with ligaclips, and divided.  The gallbladder was dissected away from the gallbladder bed using the electrocautery for hemostasis. The gallbladder was completely removed from the liver and placed into an endocatch bag. The gallbladder was removed in the endocatch bag through the umbilical port site and submitted to pathology for review.  The right upper quadrant was irrigated and the gallbladder bed was inspected. Hemostasis was achieved with the electrocautery.  Cannulae were removed under direct vision and good hemostasis was noted. Pneumoperitoneum was released and the majority of the carbon dioxide evacuated. The umbilical wound was irrigated and the fascia was then closed with the pursestring suture.  Local anesthetic was infiltrated at all port sites. Skin incisions were closed with 4-0 Monocril subcuticular sutures and Dermabond was applied.  Instrument, sponge, and needle counts were correct at the conclusion of the case.  The patient was awakened from anesthesia and brought to the recovery room in stable condition.  The patient tolerated the procedure well.   Darnell Level, MD  [facility] Office: [phone]

## 2020-12-07 NOTE — Progress Notes (Signed)
PROGRESS NOTE    Arthur Guzman  [id number] DOB: [birth year] DOA: 12/05/2020 PCP: Nickola Major, MD    Chief Complaint  Patient presents with  . Abdominal Pain    Brief Narrative:  Patient complains of right upper quadrant pain that started last night no associated fever chills nausea vomiting diarrhea.  No weight loss.  He has good appetite. Denies chest pain shortness of breath cough headaches. CT abdomen done as an outpatient today prior to admission is in care everywhere.  Findings are concerning for acute cholecystitis, intraluminal stones and question of blood products.  Underlying mass not excluded by unenhanced CT.  Left retroperitoneal lymphadenopathy concerning for metastatic disease or lymphoma.  Subjective:  Patient is seen after return from surgery, report pain is much better, no nausea no vomiting, tolerating diet, has walked in the hallway  Assessment & Plan:   Principal Problem:   Cholecystitis with cholelithiasis Active Problems:   RUQ abdominal pain   Hypokalemia   Leukocytosis  Cholecystitis status post lap chole with IOC Gallbladder noted to be markedly edematous and thick-walled Is started on Zosyn since admission, will discuss with general surgery regarding the need of antibiotic.  Retroperitoneal lymphadenopathy, reactive versus malignancy Case discussed with general surgery Dr. Harlow Asa who do not see any interval abdominal lymphadenopathy,  Recommend testicle ultrasound Outpatient follow-up with repeat CT abdomen pelvis imaging 4 to 6 weeks after surgery  Hypokalemia resolved  Hyperlipidemia, restart statin tomorrow   DVT prophylaxis: Place TED hose Start: 12/06/20 1209 SCDs Start: 12/05/20 1817   Code Status: Full Family Communication: Patient Disposition:   Status is: Inpatient   Dispo: The patient is from: Home              Anticipated d/c is to: Home              Anticipated d/c date is: Likely tomorrow if cleared by  general surgery                Consultants:   General surgery  Procedures:   Lap chole with IOC  Antimicrobials:   Zosyn since admission     Objective: Vitals:   12/07/20 1432 12/07/20 1447 12/07/20 1532 12/07/20 1645  BP: (!) 154/78 (!) 156/71 (!) 151/79 140/76  Pulse: 77 63 63 76  Resp: 10 11 16    Temp: 97.7 F (36.5 C)     TempSrc:      SpO2: 94% 95% 98% 100%  Weight:      Height:        Intake/Output Summary (Last 24 hours) at 12/07/2020 1657 Last data filed at 12/07/2020 1605 Gross per 24 hour  Intake 3635.34 ml  Output 3705 ml  Net -69.66 ml   Filed Weights   12/05/20 1942  Weight: 76.7 kg    Examination:  General exam: calm, NAD Respiratory system: Clear to auscultation. Respiratory effort normal. Cardiovascular system: S1 & S2 heard, RRR. No JVD, no murmur, No pedal edema. Gastrointestinal system: Post op changes. Normal bowel sounds heard. Central nervous system: Alert and oriented. No focal neurological deficits. Extremities: Symmetric 5 x 5 power. Skin: No rashes, lesions or ulcers Psychiatry: Judgement and insight appear normal. Mood & affect appropriate.     Data Reviewed: I have personally reviewed following labs and imaging studies  CBC: Recent Labs  Lab 12/05/20 1720 12/06/20 0455 12/07/20 0536  WBC 12.7* 8.5 6.4  NEUTROABS 8.8*  --   --   HGB 13.6 11.8* 11.8*  HCT 39.2  34.8* 35.0*  MCV 91.6 92.8 94.3  PLT 174 158 149*    Basic Metabolic Panel: Recent Labs  Lab 12/05/20 1720 12/05/20 1900 12/06/20 0455 12/07/20 0536  NA 137  --  138 139  K 3.4*  --  3.9 4.0  CL 101  --  106 108  CO2 27  --  29 25  GLUCOSE 103*  --  99 95  BUN 14  --  13 10  CREATININE 0.84  --  0.95 0.98  CALCIUM 9.0  --  8.4* 8.3*  MG  --  1.6*  --   --     GFR: Estimated Creatinine Clearance: 65.1 mL/min (by C-G formula based on SCr of 0.98 mg/dL).  Liver Function Tests: Recent Labs  Lab 12/05/20 1720 12/06/20 0455 12/07/20 0536  AST  24 19 20   ALT 23 19 17   ALKPHOS 89 71 62  BILITOT 0.7 1.1 1.0  PROT 7.2 6.2* 6.0*  ALBUMIN 3.8 3.2* 3.2*    CBG: No results for input(s): GLUCAP in the last 168 hours.   Recent Results (from the past 240 hour(s))  Resp Panel by RT-PCR (Flu A&B, Covid) Nasopharyngeal Swab     Status: None   Collection Time: 12/05/20  5:20 PM   Specimen: Nasopharyngeal Swab; Nasopharyngeal(NP) swabs in vial transport medium  Result Value Ref Range Status   SARS Coronavirus 2 by RT PCR NEGATIVE NEGATIVE Final    Comment: (NOTE) SARS-CoV-2 target nucleic acids are NOT DETECTED.  The SARS-CoV-2 RNA is generally detectable in upper respiratory specimens during the acute phase of infection. The lowest concentration of SARS-CoV-2 viral copies this assay can detect is 138 copies/mL. A negative result does not preclude SARS-Cov-2 infection and should not be used as the sole basis for treatment or other patient management decisions. A negative result may occur with  improper specimen collection/handling, submission of specimen other than nasopharyngeal swab, presence of viral mutation(s) within the areas targeted by this assay, and inadequate number of viral copies(<138 copies/mL). A negative result must be combined with clinical observations, patient history, and epidemiological information. The expected result is Negative.  Fact Sheet for Patients:  EntrepreneurPulse.com.au  Fact Sheet for Healthcare Providers:  IncredibleEmployment.be  This test is no t yet approved or cleared by the [place] FDA and  has been authorized for detection and/or diagnosis of SARS-CoV-2 by FDA under an Emergency Use Authorization (EUA). This EUA will remain  in effect (meaning this test can be used) for the duration of the COVID-19 declaration under Section 564(b)(1) of the Act, 21 U.S.C.section 360bbb-3(b)(1), unless the authorization is terminated  or revoked sooner.        Influenza A by PCR NEGATIVE NEGATIVE Final   Influenza B by PCR NEGATIVE NEGATIVE Final    Comment: (NOTE) The Xpert Xpress SARS-CoV-2/FLU/RSV plus assay is intended as an aid in the diagnosis of influenza from Nasopharyngeal swab specimens and should not be used as a sole basis for treatment. Nasal washings and aspirates are unacceptable for Xpert Xpress SARS-CoV-2/FLU/RSV testing.  Fact Sheet for Patients: EntrepreneurPulse.com.au  Fact Sheet for Healthcare Providers: IncredibleEmployment.be  This test is not yet approved or cleared by the [place] FDA and has been authorized for detection and/or diagnosis of SARS-CoV-2 by FDA under an Emergency Use Authorization (EUA). This EUA will remain in effect (meaning this test can be used) for the duration of the COVID-19 declaration under Section 564(b)(1) of the Act, 21 U.S.C. section 360bbb-3(b)(1), unless the authorization is  terminated or revoked.  Performed at [facility], [place] [address] [place], [place] [address]   Surgical pcr screen     Status: None   Collection Time: 12/06/20  8:50 PM   Specimen: Nasal Mucosa; Nasal Swab  Result Value Ref Range Status   MRSA, PCR NEGATIVE NEGATIVE Final   Staphylococcus aureus NEGATIVE NEGATIVE Final    Comment: (NOTE) The Xpert SA Assay (FDA approved for NASAL specimens in patients 70 years of age and older), is one component of a comprehensive surveillance program. It is not intended to diagnose infection nor to guide or monitor treatment. Performed at [facility], [place] [address], [place], [place] [address]          Radiology Studies: DG Cholangiogram Operative  Result Date: 12/07/2020 CLINICAL DATA:  Intraoperative cholangiogram. EXAM: INTRAOPERATIVE CHOLANGIOGRAM TECHNIQUE: Cholangiographic images from the C-arm fluoroscopic device were submitted for interpretation post-operatively. Please see  the procedural report for the amount of contrast and the fluoroscopy time utilized. COMPARISON:  MRI dated December 06, 2020. FINDINGS: The patient has undergone intraoperative cholangiogram. Injection of contrast opacifies the biliary tree. There is some mild biliary ductal dilatation. No definite filling defect was identified. There is contrast extending into the duodenum. A single cine clip was submitted for evaluation. IMPRESSION: Status post intraoperative cholangiogram without definite evidence for choledocholithiasis. Electronically Signed   By: Constance Holster M.D.   On: 12/07/2020 15:03   CT ABDOMEN PELVIS W CONTRAST  Result Date: 12/05/2020 CLINICAL DATA:  74 year old male with abdominal pain. EXAM: CT ABDOMEN AND PELVIS WITH CONTRAST TECHNIQUE: Multidetector CT imaging of the abdomen and pelvis was performed using the standard protocol following bolus administration of intravenous contrast. CONTRAST:  110mL OMNIPAQUE IOHEXOL 300 MG/ML  SOLN COMPARISON:  Abdominal ultrasound dated 12/05/2020. FINDINGS: Lower chest: The visualized lung bases are clear. No intra-abdominal free air or free fluid. Hepatobiliary: The liver is unremarkable. There is mild intrahepatic biliary dilatation or mild periportal edema. The gallbladder contains sludge or noncalcified stones. There is a 4 mm stone in the neck of the gallbladder. There is pericholecystic fluid and diffuse gallbladder wall edema. Findings are most consistent with acute cholecystitis. Clinical correlation is recommended. Pancreas: Unremarkable. No pancreatic ductal dilatation or surrounding inflammatory changes. Spleen: Normal in size without focal abnormality. Adrenals/Urinary Tract: The adrenal glands unremarkable. There is no hydronephrosis on either side. There is symmetric enhancement and excretion of contrast by both kidneys. Left renal parapelvic cysts. The visualized ureters and urinary bladder appear unremarkable. Stomach/Bowel: There is  moderate stool throughout the colon. There is no bowel obstruction or active inflammation. The appendix is normal. Vascular/Lymphatic: Mild aortoiliac atherosclerotic disease. The IVC is unremarkable. No portal venous gas. Left para-aortic/retroperitoneal adenopathy measuring up to 2.6 x 2.2 cm of indeterminate etiology but concerning for malignancy/metastatic disease. There is a trace fluid or a mildly enlarged nodule/lymph node or serosal implant along the inferior aspect of the cecum measuring 13 x 9 mm (coronal 50/5 and axial 53/2). Reproductive: The prostate and seminal vesicles are grossly unremarkable. Other: None Musculoskeletal: Osteopenia with degenerative changes of the spine. No acute osseous pathology. IMPRESSION: 1. Cholelithiasis with findings most consistent with acute cholecystitis. Clinical correlation is recommended. 2. Left para-aortic/retroperitoneal adenopathy of indeterminate etiology but concerning for malignancy/metastatic disease. 3. Moderate colonic stool burden. No bowel obstruction. Normal appendix. 4. Aortic Atherosclerosis (ICD10-I70.0). Electronically Signed   By: Anner Crete M.D.   On: 12/05/2020 22:52   MR 3D Recon At Scanner  Result Date: 12/06/2020 CLINICAL DATA:  Suspected acute cholecystitis on CT. Left para-aortic/retroperitoneal lymphadenopathy. EXAM: MRI ABDOMEN WITHOUT AND WITH CONTRAST (INCLUDING MRCP) TECHNIQUE: Multiplanar multisequence MR imaging of the abdomen was performed both before and after the administration of intravenous contrast. Heavily T2-weighted images of the biliary and pancreatic ducts were obtained, and three-dimensional MRCP images were rendered by post processing. CONTRAST:  53mL GADAVIST GADOBUTROL 1 MMOL/ML IV SOLN COMPARISON:  CT abdomen/pelvis and right upper quadrant ultrasound dated 12/05/2020. FINDINGS: Lower chest: Lung bases are clear. Hepatobiliary: Liver is within normal limits. No suspicious/enhancing hepatic lesions. Specifically,  no evidence of direct invasion along the gallbladder fossa. Distended gallbladder with cholelithiasis and irregular gallbladder wall thickening with gallbladder wall edema. This appearance is suspicious for acute cholecystitis. No intrahepatic or extrahepatic ductal dilatation. Common duct measures 8 mm. No choledocholithiasis is seen. Pancreas:  Within normal limits. Spleen:  Within normal limits. Adrenals/Urinary Tract:  Adrenal glands are within normal limits. Left renal sinus cysts. Right kidney is within normal limits. No hydronephrosis. Stomach/Bowel: Stomach and visualized bowel are unremarkable. Vascular/Lymphatic:  No evidence of abdominal aortic aneurysm. Dominant 2.2 cm short axis left para-aortic node (series 3/image 28) and additional nodes measuring 10-11 mm (series 3/images 26-27). Other:  No abdominopelvic ascites. Musculoskeletal: No focal osseous lesions. IMPRESSION: Suspected acute cholecystitis. No intrahepatic or extrahepatic ductal dilatation. Common duct measures 8 mm. No choledocholithiasis is seen. Dominant 2.2 cm short axis left para-aortic node and additional nodes measuring 10-11 mm. This appearance raises concern for nodal malignancy (lymphoma or nodal metastases), but a primary lesion is not identified on the current study or the prior CT. Given the location, consider scrotal ultrasound for further evaluation. If testicular primary is not identified, consider follow-up PET-CT (as an outpatient). Electronically Signed   By: Julian Hy M.D.   On: 12/06/2020 11:43   MR ABDOMEN MRCP W WO CONTAST  Result Date: 12/06/2020 CLINICAL DATA:  Suspected acute cholecystitis on CT. Left para-aortic/retroperitoneal lymphadenopathy. EXAM: MRI ABDOMEN WITHOUT AND WITH CONTRAST (INCLUDING MRCP) TECHNIQUE: Multiplanar multisequence MR imaging of the abdomen was performed both before and after the administration of intravenous contrast. Heavily T2-weighted images of the biliary and pancreatic  ducts were obtained, and three-dimensional MRCP images were rendered by post processing. CONTRAST:  51mL GADAVIST GADOBUTROL 1 MMOL/ML IV SOLN COMPARISON:  CT abdomen/pelvis and right upper quadrant ultrasound dated 12/05/2020. FINDINGS: Lower chest: Lung bases are clear. Hepatobiliary: Liver is within normal limits. No suspicious/enhancing hepatic lesions. Specifically, no evidence of direct invasion along the gallbladder fossa. Distended gallbladder with cholelithiasis and irregular gallbladder wall thickening with gallbladder wall edema. This appearance is suspicious for acute cholecystitis. No intrahepatic or extrahepatic ductal dilatation. Common duct measures 8 mm. No choledocholithiasis is seen. Pancreas:  Within normal limits. Spleen:  Within normal limits. Adrenals/Urinary Tract:  Adrenal glands are within normal limits. Left renal sinus cysts. Right kidney is within normal limits. No hydronephrosis. Stomach/Bowel: Stomach and visualized bowel are unremarkable. Vascular/Lymphatic:  No evidence of abdominal aortic aneurysm. Dominant 2.2 cm short axis left para-aortic node (series 3/image 28) and additional nodes measuring 10-11 mm (series 3/images 26-27). Other:  No abdominopelvic ascites. Musculoskeletal: No focal osseous lesions. IMPRESSION: Suspected acute cholecystitis. No intrahepatic or extrahepatic ductal dilatation. Common duct measures 8 mm. No choledocholithiasis is seen. Dominant 2.2 cm short axis left para-aortic node and additional nodes measuring 10-11 mm. This appearance raises concern for nodal malignancy (lymphoma or nodal metastases), but a primary lesion is not identified on the current study  or the prior CT. Given the location, consider scrotal ultrasound for further evaluation. If testicular primary is not identified, consider follow-up PET-CT (as an outpatient). Electronically Signed   By: Julian Hy M.D.   On: 12/06/2020 11:43   US Abdomen Limited RUQ (LIVER/GB)  Result Date:  12/05/2020 CLINICAL DATA:  Right upper quadrant pain EXAM: ULTRASOUND ABDOMEN LIMITED RIGHT UPPER QUADRANT COMPARISON:  None. FINDINGS: Gallbladder: No gallstones. Sludge is present. Wall thickening measuring up to 1 cm, some of which is masslike in appearance. Pericholecystic fluid is present. No sonographic Murphy sign noted by sonographer. Common bile duct: Diameter: 5 mm, normal Liver: No focal lesion identified. Within normal limits in parenchymal echogenicity. Mild intrahepatic duct dilatation. Portal vein is patent on color Doppler imaging with normal direction of blood flow towards the liver. Other: None. IMPRESSION: Abnormal appearance of gallbladder raising possibility of acute cholecystitis. However, there is no sonographic Murphy sign and some of the wall thickening is masslike in appearance. Recommend contrast enhanced CT or MRI for further evaluation. Mild intrahepatic duct dilatation. Electronically Signed   By: Macy Mis M.D.   On: 12/05/2020 18:41        Scheduled Meds: . acetaminophen  1,000 mg Oral Q8H   Continuous Infusions: . 0.9 % NaCl with KCl 40 mEq / L 100 mL/hr at 12/07/20 1507  . piperacillin-tazobactam (ZOSYN)  IV 3.375 g (12/07/20 0914)     LOS: 1 day   Time spent: 15 mins Greater than 50% of this time was spent in counseling, explanation of diagnosis, planning of further management, and coordination of care.  I have personally reviewed and interpreted on  12/07/2020 daily labs,I reviewed all nursing notes, pharmacy notes, consultant notes,  vitals, pertinent old records  I have discussed plan of care as described above with RN , patient  on 12/07/2020  Voice Recognition /Dragon dictation system was used to create this note, attempts have been made to correct errors. Please contact the author with questions and/or clarifications.   Florencia Reasons, MD PhD FACP [facility]  Available via Epic secure chat 7am-7pm for nonurgent issues Please page for  urgent issues To page the attending provider between 7A-7P or the covering provider during after hours 7P-7A, please log into the web site [email or web address] and access using universal [place] password for that web site. If you do not have the password, please call the hospital operator.    12/07/2020, 4:57 PM

## 2020-12-07 NOTE — Anesthesia Preprocedure Evaluation (Signed)
Anesthesia Evaluation  Patient identified by MRN, date of birth, ID band Patient awake    Reviewed: Allergy & Precautions, NPO status , Patient's Chart, lab work & pertinent test results  History of Anesthesia Complications Negative for: history of anesthetic complications  Airway Mallampati: II  TM Distance: >3 FB Neck ROM: Full    Dental  (+) Teeth Intact   Pulmonary neg pulmonary ROS,    Pulmonary exam normal        Cardiovascular hypertension, Normal cardiovascular exam     Neuro/Psych  Headaches, Carotid stenosis (RICA 34-19%, RCCA <62%, LICA 22-97%, LCCA <98%) TIAnegative psych ROS   GI/Hepatic negative GI ROS, Neg liver ROS,   Endo/Other  negative endocrine ROS  Renal/GU negative Renal ROS  negative genitourinary   Musculoskeletal negative musculoskeletal ROS (+)   Abdominal   Peds  Hematology negative hematology ROS (+)   Anesthesia Other Findings   Reproductive/Obstetrics                             Anesthesia Physical Anesthesia Plan  ASA: II  Anesthesia Plan: General   Post-op Pain Management:    Induction: Intravenous  PONV Risk Score and Plan: 3 and Ondansetron, Dexamethasone, Treatment may vary due to age or medical condition and Midazolam  Airway Management Planned: Oral ETT  Additional Equipment: None  Intra-op Plan:   Post-operative Plan: Extubation in OR  Informed Consent: I have reviewed the patients History and Physical, chart, labs and discussed the procedure including the risks, benefits and alternatives for the proposed anesthesia with the patient or authorized representative who has indicated his/her understanding and acceptance.     Dental advisory given  Plan Discussed with:   Anesthesia Plan Comments:         Anesthesia Quick Evaluation

## 2020-12-07 NOTE — Interval H&P Note (Signed)
History and Physical Interval Note:  12/07/2020 12:08 PM  Arthur Guzman  has presented today for surgery, with the diagnosis of CHOLECYSTITIS/CHOLELITHIASIS.  The various methods of treatment have been discussed with the patient and family. After consideration of risks, benefits and other options for treatment, the patient has consented to    Procedure(s): LAPAROSCOPIC CHOLECYSTECTOMY WITH INTRAOPERATIVE CHOLANGIOGRAM (N/A) as a surgical intervention.    The patient's history has been reviewed, patient examined, no change in status, stable for surgery.  I have reviewed the patient's chart and labs.  Questions were answered to the patient's satisfaction.    Armandina Gemma, MD [facility] Office: [place]

## 2020-12-07 NOTE — Anesthesia Postprocedure Evaluation (Signed)
Anesthesia Post Note  Patient: Arthur Guzman  Procedure(s) Performed: LAPAROSCOPIC CHOLECYSTECTOMY WITH INTRAOPERATIVE CHOLANGIOGRAM (N/A Abdomen)     Patient location during evaluation: PACU Anesthesia Type: General Level of consciousness: awake and alert Pain management: pain level controlled Vital Signs Assessment: post-procedure vital signs reviewed and stable Respiratory status: spontaneous breathing, nonlabored ventilation and respiratory function stable Cardiovascular status: blood pressure returned to baseline and stable Postop Assessment: no apparent nausea or vomiting Anesthetic complications: no   No complications documented.  Last Vitals:  Vitals:   12/07/20 1402 12/07/20 1417  BP: (!) 149/73 (!) 146/79  Pulse: 74 76  Resp: 13 20  Temp:    SpO2: 99% 95%    Last Pain:  Vitals:   12/07/20 1417  TempSrc:   PainSc: [place]

## 2020-12-08 ENCOUNTER — Encounter (HOSPITAL_COMMUNITY): Payer: Self-pay | Admitting: Surgery

## 2020-12-08 DIAGNOSIS — N5089 Other specified disorders of the male genital organs: Secondary | ICD-10-CM

## 2020-12-08 LAB — CBC WITH DIFFERENTIAL/PLATELET
Abs Immature Granulocytes: 0.05 10*3/uL (ref 0.00–0.07)
Basophils Absolute: 0 10*3/uL (ref 0.0–0.1)
Basophils Relative: 0 %
Eosinophils Absolute: 0 10*3/uL (ref 0.0–0.5)
Eosinophils Relative: 0 %
HCT: 32.7 % — ABNORMAL LOW (ref 39.0–52.0)
Hemoglobin: 11 g/dL — ABNORMAL LOW (ref 13.0–17.0)
Immature Granulocytes: 0 %
Lymphocytes Relative: 12 %
Lymphs Abs: 1.6 10*3/uL (ref 0.7–4.0)
MCH: 31.7 pg (ref 26.0–34.0)
MCHC: 33.6 g/dL (ref 30.0–36.0)
MCV: 94.2 fL (ref 80.0–100.0)
Monocytes Absolute: 1 10*3/uL (ref 0.1–1.0)
Monocytes Relative: 7 %
Neutro Abs: 11.1 10*3/uL — ABNORMAL HIGH (ref 1.7–7.7)
Neutrophils Relative %: 81 %
Platelets: 137 10*3/uL — ABNORMAL LOW (ref 150–400)
RBC: 3.47 MIL/uL — ABNORMAL LOW (ref 4.22–5.81)
RDW: 13.3 % (ref 11.5–15.5)
WBC: 13.7 10*3/uL — ABNORMAL HIGH (ref 4.0–10.5)
nRBC: 0 % (ref 0.0–0.2)

## 2020-12-08 LAB — BASIC METABOLIC PANEL
Anion gap: 7 (ref 5–15)
BUN: 9 mg/dL (ref 8–23)
CO2: 22 mmol/L (ref 22–32)
Calcium: 8.1 mg/dL — ABNORMAL LOW (ref 8.9–10.3)
Chloride: 106 mmol/L (ref 98–111)
Creatinine, Ser: 1.1 mg/dL (ref 0.61–1.24)
GFR, Estimated: >60 mL/min (ref >60)
Glucose, Bld: 137 mg/dL — ABNORMAL HIGH (ref 70–99)
Potassium: 4.3 mmol/L (ref 3.5–5.1)
Sodium: 135 mmol/L (ref 135–145)

## 2020-12-08 LAB — LACTATE DEHYDROGENASE: LDH: 130 U/L (ref 98–192)

## 2020-12-08 LAB — HCG, SERUM, QUALITATIVE: Preg, Serum: NEGATIVE

## 2020-12-08 LAB — MAGNESIUM: Magnesium: 1.8 mg/dL (ref 1.7–2.4)

## 2020-12-08 NOTE — Progress Notes (Signed)
Patient ID: Arthur Guzman, male   DOB: 14-Apr-1946, 74 y.o.   MRN: [id number]   Acute Care Surgery Service Progress Note:    Chief Complaint/Subjective: Wife at [facility] No c/o No n/v Min abd pain  Objective: Vital signs in last 24 hours: Temp:  [96.1 F (35.6 C)-98.2 F (36.8 C)] 98.1 F (36.7 C) (12/23 0648) Pulse Rate:  [61-77] 61 (12/23 0648) Resp:  [10-20] 16 (12/23 0648) BP: (121-156)/(67-79) 121/67 (12/23 0648) SpO2:  [94 %-100 %] 98 % (12/23 0648) Last BM Date: 12/05/20  Intake/Output from previous day: 12/22 0701 - 12/23 0700 In: 3117 [P.O.:600; I.V.:2390.1; IV Piggyback:126.9] Out: 1905 [Urine:1900; Blood:5] Intake/Output this shift: Total I/O In: -  Out: 250 [Urine:250]  Lungs: cta, nonlabored  Cardiovascular: reg  Abd: soft, incisions ok, min TTP, nd  Extremities: no edema, +SCDs  Neuro: alert, nonfocal  Lab Results: CBC  Recent Labs    12/07/20 0536 12/08/20 0543  WBC 6.4 13.7*  HGB 11.8* 11.0*  HCT 35.0* 32.7*  PLT 149* 137*   BMET Recent Labs    12/07/20 0536 12/08/20 0543  NA 139 135  K 4.0 4.3  CL 108 106  CO2 25 22  GLUCOSE 95 137*  BUN 10 9  CREATININE 0.98 1.10  CALCIUM 8.3* 8.1*   LFT Hepatic Function Latest Ref Rng & Units 12/07/2020 12/06/2020 12/05/2020  Total Protein 6.5 - 8.1 g/dL 6.0(L) 6.2(L) 7.2  Albumin 3.5 - 5.0 g/dL 3.2(L) 3.2(L) 3.8  AST 15 - 41 U/L 20 19 24   ALT 0 - 44 U/L 17 19 23   Alk Phosphatase 38 - 126 U/L 62 71 89  Total Bilirubin 0.3 - 1.2 mg/dL 1.0 1.1 0.7   PT/INR No results for input(s): LABPROT, INR in the last 72 hours. ABG No results for input(s): PHART, HCO3 in the last 72 hours.  Invalid input(s): PCO2, PO2  Studies/Results:  Anti-infectives: Anti-infectives (From admission, onward)   Start     Dose/Rate Route Frequency Ordered Stop   12/06/20 0200  piperacillin-tazobactam (ZOSYN) IVPB 3.375 g  Status:  Discontinued        3.375 g 12.5 mL/hr over 240 Minutes Intravenous Every 8  hours 12/05/20 1821 12/08/20 0945   12/05/20 1800  piperacillin-tazobactam (ZOSYN) IVPB 3.375 g        3.375 g 100 mL/hr over 30 Minutes Intravenous  Once 12/05/20 1747 12/05/20 1845      Medications: Scheduled Meds: . acetaminophen  1,000 mg Oral Q8H   Continuous Infusions: . 0.9 % NaCl with KCl 40 mEq / L 100 mL/hr at 12/08/20 0609   PRN Meds:.HYDROmorphone (DILAUDID) injection, oxyCODONE  Assessment/Plan: Patient Active Problem List   Diagnosis Date Noted  . Cholecystitis with cholelithiasis 12/05/2020  . Hypokalemia 12/05/2020  . Leukocytosis 12/05/2020  . RUQ abdominal pain   . Asymptomatic stenosis of right carotid artery 09/21/2019  . Essential hypertension 09/21/2019  . Migraine 08/27/2019  . TIA (transient ischemic attack) 08/25/2019  . Elevated blood pressure reading 08/25/2019   s/p Procedure(s): LAPAROSCOPIC CHOLECYSTECTOMY WITH INTRAOPERATIVE CHOLANGIOGRAM 12/07/2020 By Dr Madie Reno to discharge  No abx Discussed dc instructions with pt and wife Instructions and f/u on chart  Will need outpt f/u with urology for abnormal scrotal u/s - discussed with [facility]   Disposition:  LOS: 2 days    Leighton Ruff. Redmond Pulling, MD, FACS General, Bariatric, & Minimally Invasive Surgery [phone] [facility]

## 2020-12-08 NOTE — Plan of Care (Signed)
  Problem: Education: °Goal: Knowledge of General Education information will improve °Description: Including pain rating scale, medication(s)/side effects and non-pharmacologic comfort measures °Outcome: Progressing °  °Problem: Health Behavior/Discharge Planning: °Goal: Ability to manage health-related needs will improve °Outcome: Progressing °  °Problem: Clinical Measurements: °Goal: Ability to maintain clinical measurements within normal limits will improve °Outcome: Progressing °Goal: Will remain free from infection °Outcome: Progressing °Goal: Diagnostic test results will improve °Outcome: Progressing °Goal: Respiratory complications will improve °Outcome: Progressing °Goal: Cardiovascular complication will be avoided °Outcome: Progressing °  °Problem: Pain Managment: °Goal: General experience of comfort will improve °Outcome: Progressing °  °Problem: Skin Integrity: °Goal: Risk for impaired skin integrity will decrease °Outcome: Progressing °  °

## 2020-12-08 NOTE — Discharge Summary (Addendum)
Discharge Summary  Arthur Guzman HBZ:[id number] DOB: 07/13/1946  PCP: Nickola Major, MD  Admit date: 12/05/2020 Discharge date: 12/08/2020  Time spent: 16mins, more than 50% time spent on coordination of care.   Recommendations for Outpatient Follow-up:  1. F/u with PCP within a week  for hospital discharge follow up, repeat cbc/bmp at follow up 2. F/u with general surgery 3. 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 F/u with oncology, Dr Benay Spice will arrange  Discharge Diagnoses:  Active Hospital Problems   Diagnosis Date Noted  . Cholecystitis with cholelithiasis 12/05/2020  . Hypokalemia 12/05/2020  . Leukocytosis 12/05/2020  . RUQ abdominal pain     Resolved Hospital Problems  No resolved problems to display.    Discharge Condition: stable  Diet recommendation: heart healthy  Filed Weights   12/05/20 1942  Weight: 76.7 kg    History of present illness: (Per admitting physician Dr. Zigmund Daniel) Chief Complaint: Abdominal pain  HPI: Arthur Guzman is a 74 y.o. male with medical history significant of hypertension, hyperlipidemia sent in from the PCPs office with abnormal CT findings of cholecystitis line lymphadenopathy.  The CT was done as an outpatient without contrast. Patient complains of right upper quadrant pain that started last night no associated fever chills nausea vomiting diarrhea.  No weight loss.  He has good appetite. Denies chest pain shortness of breath cough headaches. CT abdomen done as an outpatient today prior to admission is in care everywhere.  Findings are concerning for acute cholecystitis, intraluminal stones and question of blood products.  Underlying mass not excluded by unenhanced CT.  Left retroperitoneal lymphadenopathy concerning for metastatic disease or lymphoma  Hospital Course:  Principal Problem:   Cholecystitis with cholelithiasis Active Problems:   RUQ abdominal pain   Hypokalemia    Leukocytosis  Cholecystitis status post lap chole with IOC Gallbladder noted to be markedly edematous and thick-walled Is started on Zosyn since admission,  no need of further abx post op per general surgery He is doing well post op, he is cleared to go home by general surgery,  He will follow up with general surgery  Retroperitoneal lymphadenopathy, ? malignancy Case discussed with general surgery Dr. Harlow Asa who do not see any interval abdominal lymphadenopathy,  testicle ultrasound showed left testicular mass Case discussed with urology who recommend hCG/AFP/LDH testing and outpatient urology follow-up, appointment made Case discussed with oncology on-call Dr. Benay Spice who going to refer patient to Dr. Alen Blew if patient is tested positive for testicular cancer versus referred to adult oncologist if this is lymphoma, Dr. Benay Spice will follow up on this. Patient denies weight loss, no night sweat   Hypokalemia resolved  Hyperlipidemia, restarted statin post op    Procedures:  lap chole with IOC  Consultations:  General surgery  Medical oncology  Urology  Discharge Exam: BP 121/67 (BP Location: Left Arm)   Pulse 61   Temp 98.1 F (36.7 C) (Oral)   Resp 16   Ht 5\' 9"  (1.753 m)   Wt 76.7 kg   SpO2 98%   BMI 24.97 kg/m   General: NAD Cardiovascular: RRR Respiratory: Normal respiratory effort  Discharge Instructions You were cared for by a hospitalist during your hospital stay. If you have any questions about your discharge medications or the care you received while you were in the hospital after you are discharged, you can call the unit and asked to speak with the hospitalist on call if the hospitalist that took care of you is not  available. Once you are discharged, your primary care physician will handle any further medical issues. Please note that NO REFILLS for any discharge medications will be authorized once you are discharged, as it is imperative that you return  to your primary care physician (or establish a relationship with a primary care physician if you do not have one) for your aftercare needs so that they can reassess your need for medications and monitor your lab values.  Discharge Instructions    Diet - low sodium heart healthy   Complete by: As directed    Discharge wound care:   Complete by: As directed    Follow general surgery instruction   Discharge wound care:   Complete by: As directed    Follow general surgery instruction   Increase activity slowly   Complete by: As directed    Increase activity slowly   Complete by: As directed      Allergies as of 12/08/2020      Reactions   Antihistamines, Chlorpheniramine-type Other (See Comments)   Antihistamines make me fidgety.      Medication List    TAKE these medications   aspirin 81 MG EC tablet Take 1 tablet (81 mg total) by mouth daily.   atorvastatin 40 MG tablet Commonly known as: LIPITOR Take 40 mg by mouth daily.   traMADol 50 MG tablet Commonly known as: ULTRAM Take 50 mg by mouth 2 (two) times daily as needed for severe pain.   VITAMIN B COMPLEX PO Take 1 tablet by mouth daily.            Discharge Care Instructions  (From admission, onward)         Start     Ordered   12/08/20 0000  Discharge wound care:       Comments: Follow general surgery instruction   12/08/20 0945   12/08/20 0000  Discharge wound care:       Comments: Follow general surgery instruction   12/08/20 1036         Allergies  Allergen Reactions  . Antihistamines, Chlorpheniramine-Type Other (See Comments)    Antihistamines make me fidgety.    Follow-up Information    [facility], [place] Follow up on 12/27/2020.   Specialty: General Surgery Why: your appointment is at 10:15 AM.  Be at the office 30 minutes early for check in.  Bring photo ID and insurance information with you. Contact information: [address] [place] [address] [phone]         [facility] In 2 weeks.   Why:  January 14th appt Time : 10:00am  for left testicular mass, At   For Hospitalization Follow up, Arrive 15 minutes prior to appt, [employer] Photo ID, Bring Co-pay, Take your Discharge Instruction to your appt Contact information: [place]       Nickola Major, MD Follow up in 1 week(s).   Specialty: Family Medicine Why: hospital discharge follow up, repeat cbc/bmp Contact information: [address] [place] [address] [phone]                The results of significant diagnostics from this hospitalization (including imaging, microbiology, ancillary and laboratory) are listed below for reference.    Significant Diagnostic Studies: DG Cholangiogram Operative  Result Date: 12/07/2020 CLINICAL DATA:  Intraoperative cholangiogram. EXAM: INTRAOPERATIVE CHOLANGIOGRAM TECHNIQUE: Cholangiographic images from the C-arm fluoroscopic device were submitted for  interpretation post-operatively. Please see the procedural report for the amount of contrast and the fluoroscopy time utilized. COMPARISON:  MRI dated December 06, 2020. FINDINGS: The patient has undergone intraoperative cholangiogram. Injection of contrast opacifies the biliary tree. There is some mild biliary ductal dilatation. No definite filling defect was identified. There is contrast extending into the duodenum. A single cine clip was submitted for evaluation. IMPRESSION: Status post intraoperative cholangiogram without definite evidence for choledocholithiasis. Electronically Signed   By: Constance Holster M.D.   On: 12/07/2020 15:03   US SCROTUM  Result Date: 12/07/2020 CLINICAL DATA:  Left para-aortic lymphadenopathy on CT, reported history of lymphoma EXAM: ULTRASOUND OF SCROTUM TECHNIQUE: Complete ultrasound examination of the testicles, epididymis, and other scrotal structures was performed.  COMPARISON:  None. FINDINGS: Right testicle Measurements: 4.7 x 2.1 x 3.2 cm. No mass or microlithiasis visualized. Left testicle Measurements: 3.5 x 1.4 by 1.9 cm. There is a heterogeneous area of decreased echotexture in the lower pole of the left testicle measuring 1.6 x 1.1 by 1.3 cm. There is no significant vascularity associated with this hypoechoic masslike region. Given the CT findings and clinical history, primary testicular malignancy versus intra testicular lymphoma are the leading diagnostic considerations. Right epididymis: Normal in size, with incidental 5 mm epididymal cyst of doubtful clinical significance. Left epididymis:  Normal in size and appearance. Hydrocele:  There are small bilateral hydroceles. Varicocele:  Large left varicocele. IMPRESSION: 1. Atrophic left testicle, with hypoechoic masslike area involving the lower pole. Given clinical history and CT findings, diagnostic considerations include primary testicular malignancy versus intra testicular lymphoma. 2. Large left varicocele. 3. Small bilateral hydroceles. Electronically Signed   By: Randa Ngo M.D.   On: 12/07/2020 22:36   CT ABDOMEN PELVIS W CONTRAST  Result Date: 12/05/2020 CLINICAL DATA:  74 year old male with abdominal pain. EXAM: CT ABDOMEN AND PELVIS WITH CONTRAST TECHNIQUE: Multidetector CT imaging of the abdomen and pelvis was performed using the standard protocol following bolus administration of intravenous contrast. CONTRAST:  127mL OMNIPAQUE IOHEXOL 300 MG/ML  SOLN COMPARISON:  Abdominal ultrasound dated 12/05/2020. FINDINGS: Lower chest: The visualized lung bases are clear. No intra-abdominal free air or free fluid. Hepatobiliary: The liver is unremarkable. There is mild intrahepatic biliary dilatation or mild periportal edema. The gallbladder contains sludge or noncalcified stones. There is a 4 mm stone in the neck of the gallbladder. There is pericholecystic fluid and diffuse gallbladder wall edema. Findings  are most consistent with acute cholecystitis. Clinical correlation is recommended. Pancreas: Unremarkable. No pancreatic ductal dilatation or surrounding inflammatory changes. Spleen: Normal in size without focal abnormality. Adrenals/Urinary Tract: The adrenal glands unremarkable. There is no hydronephrosis on either side. There is symmetric enhancement and excretion of contrast by both kidneys. Left renal parapelvic cysts. The visualized ureters and urinary bladder appear unremarkable. Stomach/Bowel: There is moderate stool throughout the colon. There is no bowel obstruction or active inflammation. The appendix is normal. Vascular/Lymphatic: Mild aortoiliac atherosclerotic disease. The IVC is unremarkable. No portal venous gas. Left para-aortic/retroperitoneal adenopathy measuring up to 2.6 x 2.2 cm of indeterminate etiology but concerning for malignancy/metastatic disease. There is a trace fluid or a mildly enlarged nodule/lymph node or serosal implant along the inferior aspect of the cecum measuring 13 x 9 mm (coronal 50/5 and axial 53/2). Reproductive: The prostate and seminal vesicles are grossly unremarkable. Other: None Musculoskeletal: Osteopenia with degenerative changes of the spine. No acute osseous pathology. IMPRESSION: 1. Cholelithiasis with findings most consistent with acute cholecystitis. Clinical correlation is recommended.  2. Left para-aortic/retroperitoneal adenopathy of indeterminate etiology but concerning for malignancy/metastatic disease. 3. Moderate colonic stool burden. No bowel obstruction. Normal appendix. 4. Aortic Atherosclerosis (ICD10-I70.0). Electronically Signed   By: Anner Crete M.D.   On: 12/05/2020 22:52   MR 3D Recon At Scanner  Result Date: 12/06/2020 CLINICAL DATA:  Suspected acute cholecystitis on CT. Left para-aortic/retroperitoneal lymphadenopathy. EXAM: MRI ABDOMEN WITHOUT AND WITH CONTRAST (INCLUDING MRCP) TECHNIQUE: Multiplanar multisequence MR imaging of the  abdomen was performed both before and after the administration of intravenous contrast. Heavily T2-weighted images of the biliary and pancreatic ducts were obtained, and three-dimensional MRCP images were rendered by post processing. CONTRAST:  37mL GADAVIST GADOBUTROL 1 MMOL/ML IV SOLN COMPARISON:  CT abdomen/pelvis and right upper quadrant ultrasound dated 12/05/2020. FINDINGS: Lower chest: Lung bases are clear. Hepatobiliary: Liver is within normal limits. No suspicious/enhancing hepatic lesions. Specifically, no evidence of direct invasion along the gallbladder fossa. Distended gallbladder with cholelithiasis and irregular gallbladder wall thickening with gallbladder wall edema. This appearance is suspicious for acute cholecystitis. No intrahepatic or extrahepatic ductal dilatation. Common duct measures 8 mm. No choledocholithiasis is seen. Pancreas:  Within normal limits. Spleen:  Within normal limits. Adrenals/Urinary Tract:  Adrenal glands are within normal limits. Left renal sinus cysts. Right kidney is within normal limits. No hydronephrosis. Stomach/Bowel: Stomach and visualized bowel are unremarkable. Vascular/Lymphatic:  No evidence of abdominal aortic aneurysm. Dominant 2.2 cm short axis left para-aortic node (series 3/image 28) and additional nodes measuring 10-11 mm (series 3/images 26-27). Other:  No abdominopelvic ascites. Musculoskeletal: No focal osseous lesions. IMPRESSION: Suspected acute cholecystitis. No intrahepatic or extrahepatic ductal dilatation. Common duct measures 8 mm. No choledocholithiasis is seen. Dominant 2.2 cm short axis left para-aortic node and additional nodes measuring 10-11 mm. This appearance raises concern for nodal malignancy (lymphoma or nodal metastases), but a primary lesion is not identified on the current study or the prior CT. Given the location, consider scrotal ultrasound for further evaluation. If testicular primary is not identified, consider follow-up PET-CT  (as an outpatient). Electronically Signed   By: Julian Hy M.D.   On: 12/06/2020 11:43   MR ABDOMEN MRCP W WO CONTAST  Result Date: 12/06/2020 CLINICAL DATA:  Suspected acute cholecystitis on CT. Left para-aortic/retroperitoneal lymphadenopathy. EXAM: MRI ABDOMEN WITHOUT AND WITH CONTRAST (INCLUDING MRCP) TECHNIQUE: Multiplanar multisequence MR imaging of the abdomen was performed both before and after the administration of intravenous contrast. Heavily T2-weighted images of the biliary and pancreatic ducts were obtained, and three-dimensional MRCP images were rendered by post processing. CONTRAST:  32mL GADAVIST GADOBUTROL 1 MMOL/ML IV SOLN COMPARISON:  CT abdomen/pelvis and right upper quadrant ultrasound dated 12/05/2020. FINDINGS: Lower chest: Lung bases are clear. Hepatobiliary: Liver is within normal limits. No suspicious/enhancing hepatic lesions. Specifically, no evidence of direct invasion along the gallbladder fossa. Distended gallbladder with cholelithiasis and irregular gallbladder wall thickening with gallbladder wall edema. This appearance is suspicious for acute cholecystitis. No intrahepatic or extrahepatic ductal dilatation. Common duct measures 8 mm. No choledocholithiasis is seen. Pancreas:  Within normal limits. Spleen:  Within normal limits. Adrenals/Urinary Tract:  Adrenal glands are within normal limits. Left renal sinus cysts. Right kidney is within normal limits. No hydronephrosis. Stomach/Bowel: Stomach and visualized bowel are unremarkable. Vascular/Lymphatic:  No evidence of abdominal aortic aneurysm. Dominant 2.2 cm short axis left para-aortic node (series 3/image 28) and additional nodes measuring 10-11 mm (series 3/images 26-27). Other:  No abdominopelvic ascites. Musculoskeletal: No focal osseous lesions. IMPRESSION: Suspected acute cholecystitis. No intrahepatic or  extrahepatic ductal dilatation. Common duct measures 8 mm. No choledocholithiasis is seen. Dominant 2.2 cm  short axis left para-aortic node and additional nodes measuring 10-11 mm. This appearance raises concern for nodal malignancy (lymphoma or nodal metastases), but a primary lesion is not identified on the current study or the prior CT. Given the location, consider scrotal ultrasound for further evaluation. If testicular primary is not identified, consider follow-up PET-CT (as an outpatient). Electronically Signed   By: Julian Hy M.D.   On: 12/06/2020 11:43   US Abdomen Limited RUQ (LIVER/GB)  Result Date: 12/05/2020 CLINICAL DATA:  Right upper quadrant pain EXAM: ULTRASOUND ABDOMEN LIMITED RIGHT UPPER QUADRANT COMPARISON:  None. FINDINGS: Gallbladder: No gallstones. Sludge is present. Wall thickening measuring up to 1 cm, some of which is masslike in appearance. Pericholecystic fluid is present. No sonographic Murphy sign noted by sonographer. Common bile duct: Diameter: 5 mm, normal Liver: No focal lesion identified. Within normal limits in parenchymal echogenicity. Mild intrahepatic duct dilatation. Portal vein is patent on color Doppler imaging with normal direction of blood flow towards the liver. Other: None. IMPRESSION: Abnormal appearance of gallbladder raising possibility of acute cholecystitis. However, there is no sonographic Murphy sign and some of the wall thickening is masslike in appearance. Recommend contrast enhanced CT or MRI for further evaluation. Mild intrahepatic duct dilatation. Electronically Signed   By: Macy Mis M.D.   On: 12/05/2020 18:41    Microbiology: Recent Results (from the past 240 hour(s))  Resp Panel by RT-PCR (Flu A&B, Covid) Nasopharyngeal Swab     Status: None   Collection Time: 12/05/20  5:20 PM   Specimen: Nasopharyngeal Swab; Nasopharyngeal(NP) swabs in vial transport medium  Result Value Ref Range Status   SARS Coronavirus 2 by RT PCR NEGATIVE NEGATIVE Final    Comment: (NOTE) SARS-CoV-2 target nucleic acids are NOT DETECTED.  The SARS-CoV-2  RNA is generally detectable in upper respiratory specimens during the acute phase of infection. The lowest concentration of SARS-CoV-2 viral copies this assay can detect is 138 copies/mL. A negative result does not preclude SARS-Cov-2 infection and should not be used as the sole basis for treatment or other patient management decisions. A negative result may occur with  improper specimen collection/handling, submission of specimen other than nasopharyngeal swab, presence of viral mutation(s) within the areas targeted by this assay, and inadequate number of viral copies(<138 copies/mL). A negative result must be combined with clinical observations, patient history, and epidemiological information. The expected result is Negative.  Fact Sheet for Patients:  EntrepreneurPulse.com.au  Fact Sheet for Healthcare Providers:  IncredibleEmployment.be  This test is no t yet approved or cleared by the [place] FDA and  has been authorized for detection and/or diagnosis of SARS-CoV-2 by FDA under an Emergency Use Authorization (EUA). This EUA will remain  in effect (meaning this test can be used) for the duration of the COVID-19 declaration under Section 564(b)(1) of the Act, 21 U.S.C.section 360bbb-3(b)(1), unless the authorization is terminated  or revoked sooner.       Influenza A by PCR NEGATIVE NEGATIVE Final   Influenza B by PCR NEGATIVE NEGATIVE Final    Comment: (NOTE) The Xpert Xpress SARS-CoV-2/FLU/RSV plus assay is intended as an aid in the diagnosis of influenza from Nasopharyngeal swab specimens and should not be used as a sole basis for treatment. Nasal washings and aspirates are unacceptable for Xpert Xpress SARS-CoV-2/FLU/RSV testing.  Fact Sheet for Patients: EntrepreneurPulse.com.au  Fact Sheet for Healthcare Providers: IncredibleEmployment.be  This test  is not yet approved or cleared by the  [place] and has been authorized for detection and/or diagnosis of SARS-CoV-2 by FDA under an Emergency Use Authorization (EUA). This EUA will remain in effect (meaning this test can be used) for the duration of the COVID-19 declaration under Section 564(b)(1) of the Act, 21 U.S.C. section 360bbb-3(b)(1), unless the authorization is terminated or revoked.  Performed at [facility], [place] [address], [place], [place] [address]   Surgical pcr screen     Status: None   Collection Time: 12/06/20  8:50 PM   Specimen: Nasal Mucosa; Nasal Swab  Result Value Ref Range Status   MRSA, PCR NEGATIVE NEGATIVE Final   Staphylococcus aureus NEGATIVE NEGATIVE Final    Comment: (NOTE) The Xpert SA Assay (FDA approved for NASAL specimens in patients 4 years of age and older), is one component of a comprehensive surveillance program. It is not intended to diagnose infection nor to guide or monitor treatment. Performed at [facility], [place] [address] [place], [place] [address]      Labs: Basic Metabolic Panel: Recent Labs  Lab 12/05/20 1720 12/05/20 1900 12/06/20 0455 12/07/20 0536 12/08/20 0543  NA 137  --  138 139 135  K 3.4*  --  3.9 4.0 4.3  CL 101  --  106 108 106  CO2 27  --  29 25 22   GLUCOSE 103*  --  99 95 137*  BUN 14  --  13 10 9   CREATININE 0.84  --  0.95 0.98 1.10  CALCIUM 9.0  --  8.4* 8.3* 8.1*  MG  --  1.6*  --   --  1.8   Liver Function Tests: Recent Labs  Lab 12/05/20 1720 12/06/20 0455 12/07/20 0536  AST 24 19 20   ALT 23 19 17   ALKPHOS 89 71 62  BILITOT 0.7 1.1 1.0  PROT 7.2 6.2* 6.0*  ALBUMIN 3.8 3.2* 3.2*   Recent Labs  Lab 12/05/20 1720  LIPASE 27   No results for input(s): AMMONIA in the last 168 hours. CBC: Recent Labs  Lab 12/05/20 1720 12/06/20 0455 12/07/20 0536 12/08/20 0543  WBC 12.7* 8.5 6.4 13.7*  NEUTROABS 8.8*  --   --  11.1*  HGB 13.6 11.8* 11.8* 11.0*  HCT 39.2 34.8* 35.0* 32.7*   MCV 91.6 92.8 94.3 94.2  PLT 174 158 149* 137*   Cardiac Enzymes: No results for input(s): CKTOTAL, CKMB, CKMBINDEX, TROPONINI in the last 168 hours. BNP: BNP (last 3 results) No results for input(s): BNP in the last 8760 hours.  ProBNP (last 3 results) No results for input(s): PROBNP in the last 8760 hours.  CBG: No results for input(s): GLUCAP in the last 168 hours.     Signed:  Florencia Reasons MD, PhD, FACP  [facility] 12/08/2020, 11:37 AM

## 2020-12-08 NOTE — Discharge Instructions (Signed)
[facility] ______[facility] °LAPAROSCOPIC SURGERY: POST OP INSTRUCTIONS °Always review your discharge instruction sheet given to you by the facility where your surgery was performed. °IF YOU HAVE DISABILITY OR FAMILY LEAVE FORMS, YOU MUST BRING THEM TO THE OFFICE FOR PROCESSING.   °DO NOT GIVE THEM TO YOUR DOCTOR. ° °1. A prescription for pain medication may be given to you upon discharge.  Take your pain medication as prescribed, if needed.  If narcotic pain medicine is not needed, then you may take acetaminophen (Tylenol) or ibuprofen (Advil) as needed. °2. Take your usually prescribed medications unless otherwise directed. °3. If you need a refill on your pain medication, please contact your pharmacy.  They will contact our office to request authorization. Prescriptions will not be filled after 5pm or on week-ends. °4. You should follow a light diet the first few days after arrival home, such as soup and crackers, etc.  Be sure to include lots of fluids daily. °5. Most patients will experience some swelling and bruising in the area of the incisions.  Ice packs will help.  Swelling and bruising can take several days to resolve.  °6. It is common to experience some constipation if taking pain medication after surgery.  Increasing fluid intake and taking a stool softener (such as Colace) will usually help or prevent this problem from occurring.  A mild laxative (Milk of Magnesia or Miralax) should be taken according to package instructions if there are no bowel movements after 48 hours. °7. Unless discharge instructions indicate otherwise, you may remove your bandages 24-48 hours after surgery, and you may shower at that time.  You may have steri-strips (small skin tapes) in place directly over the incision.  These strips should be left on the skin for 7-10 days.  If your surgeon used skin glue on the incision, you may shower in 24 hours.  The glue will flake off over the next 2-3 weeks.  Any sutures or  staples will be removed at the office during your follow-up visit. °8. ACTIVITIES:  You may resume regular (light) daily activities beginning the next day--such as daily self-care, walking, climbing stairs--gradually increasing activities as tolerated.  You may have sexual intercourse when it is comfortable.  Refrain from any heavy lifting or straining until approved by your doctor. °a. You may drive when you are no longer taking prescription pain medication, you can comfortably wear a seatbelt, and you can safely maneuver your car and apply brakes. °9. You should see your doctor in the office for a follow-up appointment approximately 2-3 weeks after your surgery.  Make sure that you call for this appointment within a day or two after you arrive home to insure a convenient appointment time. ° °WHEN TO CALL YOUR DOCTOR: °1. Fever over 101.0 °2. Inability to urinate °3. Continued bleeding from incision. °4. Increased pain, redness, or drainage from the incision. °5. Increasing abdominal pain ° °The clinic staff is available to answer your questions during regular business hours.  Please don’t hesitate to call and ask to speak to one of the nurses for clinical concerns.  If you have a medical emergency, go to the nearest emergency room or call 911.  A surgeon from [facility] is always on call at the hospital. °[address], [address], [place], [place]  [address] ? [address] [address], [place], [place]   [address] °[phone] ? [phone] ? FAX [phone] °Web site: [email or web address] °

## 2020-12-09 LAB — AFP TUMOR MARKER: AFP, Serum, Tumor Marker: 0.9 ng/mL (ref 0.0–8.3)

## 2020-12-12 LAB — SURGICAL PATHOLOGY

## 2021-03-02 ENCOUNTER — Other Ambulatory Visit: Payer: Self-pay

## 2021-03-02 ENCOUNTER — Ambulatory Visit: Payer: Medicare Other

## 2021-03-02 DIAGNOSIS — I6523 Occlusion and stenosis of bilateral carotid arteries: Secondary | ICD-10-CM

## 2021-03-06 NOTE — Progress Notes (Signed)
Please reveiw

## 2021-03-22 NOTE — Progress Notes (Signed)
Patient referred by Nickola Major, MD for carotid artery stenosis.  Subjective:   Arthur Guzman, male    DOB: March 01, 1946, 75 y.o.   MRN: [id number]   Chief Complaint  Patient presents with  . Asymptomatic stenosis of right carotid artery  . Follow-up    1 year  . Hypertension   HPI  75 year old Caucasian male with h/o complex migraine/TIA, moderate asymptomatic right carotid stenosis.  Patient is doing well, denies chest pain, shortness of breath, palpitations, leg edema, orthopnea, PND, TIA/syncope.Blood pressure is elevated. He is not on any medications at this time. Reviewed recent carotid US results with the patient, details below.    Current Outpatient Medications on File Prior to Visit  Medication Sig Dispense Refill  . aspirin EC 81 MG EC tablet Take 1 tablet (81 mg total) by mouth daily.    Marland Kitchen atorvastatin (LIPITOR) 40 MG tablet Take 40 mg by mouth daily.    . B Complex Vitamins (VITAMIN B COMPLEX PO) Take 1 tablet by mouth daily.    Marland Kitchen HYDROcodone-acetaminophen (NORCO/VICODIN) 5-325 MG tablet Take 1 tablet by mouth every 6 (six) hours as needed.    . traMADol (ULTRAM) 50 MG tablet Take 50 mg by mouth 2 (two) times daily as needed for severe pain.     No current facility-administered medications on file prior to visit.    Cardiovascular studies:  EKG 03/22/2021: Sinus rhythm 71 bpm. Left anterior fascicular block.    Carotid artery duplex 03/02/2021:  Stenosis in the right internal carotid artery (50-69%). Stenosis in the  right common carotid artery (<50%).  Stenosis in the left internal carotid artery (16-49%). Stenosis in the  left common carotid artery (<50%).  Antegrade right vertebral artery flow. Antegrade left vertebral artery  flow.  Follow up in six months is appropriate if clinically indicated. No  significant change from 09/15/2020.  MRI brain 08/26/2019: 1. No acute intracranial infarct or other abnormality identified. 2. Age-related  cerebral atrophy with moderate chronic microvascular ischemic disease, with superimposed small remote lacunar infarcts involving the bilateral caudate nuclei.  CTA head/neck 08/26/2019: 1. Negative CTA for large vessel occlusion. 2. 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 3. No hemodynamically significant or correctable stenosis within the intracranial circulation. 4. Fetal type origin of the PCAs with overall diminutive vertebrobasilar system.  Echocardiogram 08/25/2018:  1. The left ventricle has normal systolic function with an ejection fraction of 60-65%. The cavity size was normal. There is moderately increased left ventricular wall thickness. Left ventricular diastolic Doppler parameters are consistent with impaired  relaxation. Indeterminate filling pressures The E/e' is 8-15. No evidence of left ventricular regional wall motion abnormalities.  2. The right ventricle has normal systolic function. The cavity was normal. There is no increase in right ventricular wall thickness.  3. The mitral valve is grossly normal.  4. The tricuspid valve is grossly normal.  5. The aortic valve is tricuspid. No stenosis of the aortic valve.  6. The aorta is normal unless otherwise noted.  Recent labs: 12/08/2020: Glucose 137, BUN/Cr 9/1.1. EGFR >60. Na/K 135/4.3. Rest of the CMP normal H/H 11/32. MCV 94. Platelets 137  03/09/2020: Chol 106, TG 125, HDL 35, LDL 101  08/27/2019: Glucose 93, BUN/Cr 13/1.03. EGFR >60. Na/K 139/3.2. Albumin 3.1. Rest of the CMP normal H/H 12.9/36.5. MCV 89. Platelets 188 HbA1C 5.4% Chol 169, TG 183, HDL 31, LDL 101  Review of Systems  Cardiovascular: Negative for chest pain, dyspnea on exertion, leg swelling, palpitations and syncope.         Vitals:   03/23/21 1010  BP: (!) 152/82  Pulse: 74  Resp: 16  Temp: 98.5 F (36.9 C)  SpO2: 97%     Body  mass index is 25.1 kg/m. Filed Weights   03/23/21 1010  Weight: 170 lb (77.1 kg)     Objective:   Physical Exam Vitals and nursing note reviewed.  Constitutional:      Appearance: He is well-developed.  Neck:     Vascular: No JVD.  Cardiovascular:     Rate and Rhythm: Normal rate and regular rhythm.     Pulses: Intact distal pulses.          Carotid pulses are on the right side with bruit.      Dorsalis pedis pulses are 1+ on the right side and 1+ on the left side.       Posterior tibial pulses are 0 on the right side and 0 on the left side.     Heart sounds: Normal heart sounds. No murmur heard.   Pulmonary:     Effort: Pulmonary effort is normal.     Breath sounds: Normal breath sounds. No wheezing or rales.  Musculoskeletal:     Right lower leg: No edema.     Left lower leg: No edema.           Assessment & Recommendations:   74-year-old Caucasian male with h/o complex migraine/>TIA, moderate asymptomatic right carotid stenosis.  Moderate Rt carotid stenosis: Stable, asymptomatic. Continue Aspirin, statin. Check lipid panel. Will repeat carotid duplex in 02/2022, will order at next visit.   Hypertension: Started amlodipine 5 mg.  Abnormal vascular exam: Will heck ABI. He has a bruise on his right foot, medical aspect Re-check in 4 weeks  F/u in 4 weeks    Manish J Patwardhan, MD [facility] Pager: [phone] Office: [phone] If no answer Cell [phone]   

## 2021-03-23 ENCOUNTER — Other Ambulatory Visit: Payer: Self-pay

## 2021-03-23 ENCOUNTER — Ambulatory Visit: Payer: Medicare Other | Admitting: Cardiology

## 2021-03-23 ENCOUNTER — Encounter: Payer: Self-pay | Admitting: Cardiology

## 2021-03-23 VITALS — BP 152/82 | HR 74 | Temp 98.5°F | Resp 16 | Ht 69.0 in | Wt 170.0 lb

## 2021-03-23 DIAGNOSIS — I10 Essential (primary) hypertension: Secondary | ICD-10-CM

## 2021-03-23 DIAGNOSIS — I6521 Occlusion and stenosis of right carotid artery: Secondary | ICD-10-CM

## 2021-03-23 DIAGNOSIS — R0989 Other specified symptoms and signs involving the circulatory and respiratory systems: Secondary | ICD-10-CM

## 2021-03-23 DIAGNOSIS — E782 Mixed hyperlipidemia: Secondary | ICD-10-CM

## 2021-03-23 MED ORDER — AMLODIPINE BESYLATE 5 MG PO TABS: 5.0000 mg | ORAL_TABLET | Freq: Every day | ORAL | 1 refills | Status: DC

## 2021-03-25 LAB — LIPID PANEL
Chol/HDL Ratio: 2.9 ratio (ref 0.0–5.0)
Cholesterol, Total: 110 mg/dL (ref 100–199)
HDL: 38 mg/dL — ABNORMAL LOW (ref >39)
LDL Chol Calc (NIH): 51 mg/dL (ref 0–99)
Triglycerides: 114 mg/dL (ref 0–149)
VLDL Cholesterol Cal: 21 mg/dL (ref 5–40)

## 2021-04-06 ENCOUNTER — Ambulatory Visit: Payer: Medicare Other

## 2021-04-06 ENCOUNTER — Other Ambulatory Visit: Payer: Self-pay

## 2021-04-06 DIAGNOSIS — R0989 Other specified symptoms and signs involving the circulatory and respiratory systems: Secondary | ICD-10-CM

## 2021-04-15 ENCOUNTER — Other Ambulatory Visit: Payer: Self-pay | Admitting: Cardiology

## 2021-04-15 DIAGNOSIS — I10 Essential (primary) hypertension: Secondary | ICD-10-CM

## 2021-04-27 ENCOUNTER — Ambulatory Visit: Payer: Medicare Other | Admitting: Cardiology

## 2021-04-27 ENCOUNTER — Other Ambulatory Visit: Payer: Self-pay

## 2021-04-27 ENCOUNTER — Encounter: Payer: Self-pay | Admitting: Cardiology

## 2021-04-27 VITALS — BP 157/90 | HR 80 | Temp 97.8°F | Resp 16 | Ht 69.0 in | Wt 169.0 lb

## 2021-04-27 DIAGNOSIS — I6523 Occlusion and stenosis of bilateral carotid arteries: Secondary | ICD-10-CM

## 2021-04-27 DIAGNOSIS — I10 Essential (primary) hypertension: Secondary | ICD-10-CM

## 2021-04-27 DIAGNOSIS — E782 Mixed hyperlipidemia: Secondary | ICD-10-CM | POA: Insufficient documentation

## 2021-04-27 MED ORDER — CHLORTHALIDONE 25 MG PO TABS
12.5000 mg | ORAL_TABLET | Freq: Every day | ORAL | 3 refills | Status: DC
Start: 2021-04-27 — End: 2021-12-07

## 2021-04-27 NOTE — Progress Notes (Signed)
Patient referred by Nickola Major, MD for carotid artery stenosis.  Subjective:   Arthur Guzman, male    DOB: [birth year], 75 y.o.   MRN: [id number]   Chief Complaint  Patient presents with  . Hypertension  . Asymptomatic stenosis of right carotid artery  . Follow-up    4 week   HPI  75 year old Caucasian male with h/o complex migraine/TIA, moderate asymptomatic right carotid stenosis.  Patient stopped amlodipine due to leg edema. Blood pressure stays elevated.    Current Outpatient Medications on File Prior to Visit  Medication Sig Dispense Refill  . aspirin EC 81 MG EC tablet Take 1 tablet (81 mg total) by mouth daily.    Marland Kitchen atorvastatin (LIPITOR) 40 MG tablet Take 40 mg by mouth daily.     No current facility-administered medications on file prior to visit.    Cardiovascular studies:  EKG 03/22/2021: Sinus rhythm 71 bpm. Left anterior fascicular block.   ABI 04/06/2021:  This exam reveals normal perfusion of the right and left lower extremity  (Rt. ABI 0.97 and Lt. ABI 1.00). Normal triphasic waveform at bilateral  ankles. Left DP waveform diminished  Carotid artery duplex 03/02/2021:  Stenosis in the right internal carotid artery (50-69%). Stenosis in the  right common carotid artery (<50%).  Stenosis in the left internal carotid artery (16-49%). Stenosis in the  left common carotid artery (<50%).  Antegrade right vertebral artery flow. Antegrade left vertebral artery  flow.  Follow up in six months is appropriate if clinically indicated. No  significant change from 09/15/2020.  MRI brain 08/26/2019: 1. No acute intracranial infarct or other abnormality identified. 2. 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  CTA head/neck 08/26/2019: 1. Negative CTA for large vessel occlusion. 2. Atherosclerotic change about the proximal  cervical ICAs bilaterally, with associated stenoses of up to 65% on the right and 55% on the left. 3. No hemodynamically significant or correctable stenosis within the intracranial circulation. 4. Fetal type origin of the PCAs with overall diminutive vertebrobasilar system.  Echocardiogram 08/25/2018:  1. The left ventricle has normal systolic function with an ejection fraction of 60-65%. The cavity size was normal. There is moderately increased left ventricular wall thickness. Left ventricular diastolic Doppler parameters are consistent with impaired  relaxation. Indeterminate filling pressures The E/e' is 8-15. No evidence of left ventricular regional wall motion abnormalities.  2. The right ventricle has normal systolic function. The cavity was normal. There is no increase in right ventricular wall thickness.  3. The mitral valve is grossly normal.  4. The tricuspid valve is grossly normal.  5. The aortic valve is tricuspid. No stenosis of the aortic valve.  6. The aorta is normal unless otherwise noted.  Recent labs: 03/24/2021: Chol 110, TG 114, HDL 38, LDL 51  12/08/2020: Glucose 137, BUN/Cr 9/1.1. EGFR >60. Na/K 135/4.3. Rest of the CMP normal H/H 11/32. MCV 94. Platelets 137  03/09/2020: Chol 106, TG 125, HDL 35, LDL 101  08/27/2019: Glucose 93, BUN/Cr 13/1.03. EGFR >60. Na/K 139/3.2. Albumin 3.1. Rest of the CMP normal H/H 12.9/36.5. MCV 89. Platelets 188 HbA1C 5.4% Chol 169, TG 183, HDL 31, LDL 101    Review of Systems  Cardiovascular: Negative for chest pain, dyspnea on exertion, leg swelling, palpitations and syncope.         Vitals:   04/27/21 1018 04/27/21 1021  BP: (!) 166/78 (!) 157/90  Pulse:  79 80  Resp: 16   Temp: 97.8 F (36.6 C)   SpO2: 98%      Body mass index is 24.96 kg/m. Filed Weights   04/27/21 1018  Weight: 169 lb (76.7 kg)     Objective:   Physical Exam Vitals and nursing note reviewed.  Constitutional:      Appearance: He is  well-developed.  Neck:     Vascular: No JVD.  Cardiovascular:     Rate and Rhythm: Normal rate and regular rhythm.     Pulses: Intact distal pulses.          Carotid pulses are on the right side with bruit and on the left side with bruit.      Dorsalis pedis pulses are 1+ on the right side and 1+ on the left side.       Posterior tibial pulses are 0 on the right side and 0 on the left side.     Heart sounds: Normal heart sounds. No murmur heard.   Pulmonary:     Effort: Pulmonary effort is normal.     Breath sounds: Normal breath sounds. No wheezing or rales.  Musculoskeletal:     Right lower leg: No edema.     Left lower leg: No edema.           Assessment & Recommendations:   75 year old Caucasian male with h/o complex migraine/TIA, moderate asymptomatic right carotid stenosis.  Moderate Rt carotid stenosis: Stable, asymptomatic. Continue Aspirin, statin. LDL down to 51. Repeat carotid duplex in 02/2022  Hypertension: Could not tolerate amlodipine due to leg edema. Recommend chlorthalidone 12.5 mg daily.   Abnormal vascular exam: Mild Lt DP waveform abnormality. Othrewise normal.   F/u in 4-6 werks   Nigel Mormon, MD [facility] Pager: [phone] Office: [phone] If no answer Cell [phone]

## 2021-05-24 NOTE — Progress Notes (Signed)
Patient referred by Nickola Major, MD for carotid artery stenosis.  Subjective:   Arthur Guzman, male    DOB: Oct 13, 1946, 75 y.o.   MRN: [id number]   Chief Complaint  Patient presents with   Hypertension   Follow-up   HPI  75 year old Caucasian male with h/o complex migraine/TIA, moderate asymptomatic right carotid stenosis.  Patient started taking chlorthalidone 12.5 mg daily.  However, he noticed his blood pressure did not decrease, so we will increase it to 25 mg daily.  With this, blood pressure gets elevated.  At this point, he tells me that his PCP started him on vitamin D medications, following which his blood pressure improved.  Taking chlorthalidone.  Subsequently, on his blood pressure checks at home have been between 120-130/70-80 mmHg.  He wants to stay off chlorthalidone.  Blood pressure is elevated in office today, he tells me his blood pressure at home this morning was 937 mmHg systolic.  He denies any stroke/TIA symptoms.   Current Outpatient Medications on File Prior to Visit  Medication Sig Dispense Refill   aspirin EC 81 MG EC tablet Take 1 tablet (81 mg total) by mouth daily.     atorvastatin (LIPITOR) 40 MG tablet Take 40 mg by mouth daily.     chlorthalidone (HYGROTON) 25 MG tablet Take 0.5 tablets (12.5 mg total) by mouth daily. 30 tablet 3   No current facility-administered medications on file prior to visit.    Cardiovascular studies:  EKG 03/22/2021: Sinus rhythm 71 bpm. Left anterior fascicular block.   ABI 04/06/2021:  This exam reveals normal perfusion of the right and left lower extremity  (Rt. ABI 0.97 and Lt. ABI 1.00). Normal triphasic waveform at bilateral  ankles. Left DP waveform diminished  Carotid artery duplex 03/02/2021:  Stenosis in the right internal carotid artery (50-69%). Stenosis in the  right common carotid artery (<50%).  Stenosis in the left internal carotid artery (16-49%). Stenosis in the  left common carotid  artery (<50%).  Antegrade right vertebral artery flow. Antegrade left vertebral artery  flow.  Follow up in six months is appropriate if clinically indicated. No  significant change from 09/15/2020.  MRI brain 08/26/2019: 1. No acute intracranial infarct or other abnormality identified. 2. 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  CTA head/neck 08/26/2019: 1. Negative CTA for large vessel occlusion. 2. 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 3. No hemodynamically significant or correctable stenosis within the intracranial circulation. 4. Fetal type origin of the PCAs with overall diminutive vertebrobasilar system.  Echocardiogram 08/25/2018:  1. The left ventricle has normal systolic function with an ejection fraction of 60-65%. The cavity size was normal. There is moderately increased left ventricular wall thickness. Left ventricular diastolic Doppler parameters are consistent with impaired  relaxation. Indeterminate filling pressures The E/e' is 8-15. No evidence of left ventricular regional wall motion abnormalities.  2. The right ventricle has normal systolic function. The cavity was normal. There is no increase in right ventricular wall thickness.  3. The mitral valve is grossly normal.  4. The tricuspid valve is grossly normal.  5. The aortic valve is tricuspid. No stenosis of the aortic valve.  6. The aorta is normal unless otherwise noted.   Recent labs: 03/24/2021: Chol 110, TG 114, HDL 38, LDL 51  12/08/2020: Glucose 137, BUN/Cr  9/1.1. EGFR >60. Na/K 135/4.3. Rest of the CMP normal H/H 11/32. MCV 94. Platelets 137  03/09/2020: Chol 106, TG 125, HDL 35, LDL 101  08/27/2019: Glucose 93, BUN/Cr 13/1.03. EGFR >60. Na/K 139/3.2. Albumin 3.1. Rest of  the CMP normal H/H 12.9/36.5. MCV 89. Platelets 188 HbA1C 5.4% Chol 169, TG 183, HDL 31, LDL 101    Review of Systems  Cardiovascular:  Negative for chest pain, dyspnea on exertion, leg swelling, palpitations and syncope.        Vitals:   05/25/21 0817  BP: (!) 150/79  Pulse: 70  Temp: 98.3 F (36.8 C)  SpO2: 96%     Body mass index is 24.66 kg/m. Filed Weights   05/25/21 0817  Weight: 167 lb (75.8 kg)     Objective:   Physical Exam Vitals and nursing note reviewed.  Constitutional:      Appearance: He is well-developed.  Neck:     Vascular: No JVD.  Cardiovascular:     Rate and Rhythm: Normal rate and regular rhythm.     Pulses: Intact distal pulses.          Carotid pulses are  on the right side with bruit and  on the left side with bruit.      Dorsalis pedis pulses are 1+ on the right side and 1+ on the left side.       Posterior tibial pulses are 0 on the right side and 0 on the left side.     Heart sounds: Normal heart sounds. No murmur heard. Pulmonary:     Effort: Pulmonary effort is normal.     Breath sounds: Normal breath sounds. No wheezing or rales.  Musculoskeletal:     Right lower leg: No edema.     Left lower leg: No edema.          Assessment & Recommendations:   75 year old Caucasian male with h/o complex migraine/TIA, moderate asymptomatic right carotid stenosis.  Moderate Rt carotid stenosis: Stable, asymptomatic. Continue Aspirin, statin. LDL down to 51. Repeat carotid duplex in 02/2022 Patient knows to look for TIA/stroke symptoms and call 911 if they occur.  Hypertension: Controlled at home without antihypertensive therapy. Continue home blood pressure monitoring and follow-up with PCP.  Abnormal vascular exam: Mild Lt DP waveform abnormality. Othrewise normal.   F/u in 6 months  Geneive Sandstrom Esther Hardy, MD [facility] Pager: [phone] Office: [phone] If no answer Cell [phone]

## 2021-05-25 ENCOUNTER — Encounter: Payer: Self-pay | Admitting: Cardiology

## 2021-05-25 ENCOUNTER — Other Ambulatory Visit: Payer: Self-pay

## 2021-05-25 ENCOUNTER — Ambulatory Visit: Payer: Medicare Other | Admitting: Cardiology

## 2021-05-25 VITALS — BP 150/79 | HR 70 | Temp 98.3°F | Ht 69.0 in | Wt 167.0 lb

## 2021-05-25 DIAGNOSIS — I10 Essential (primary) hypertension: Secondary | ICD-10-CM

## 2021-05-25 DIAGNOSIS — I6523 Occlusion and stenosis of bilateral carotid arteries: Secondary | ICD-10-CM

## 2021-05-25 DIAGNOSIS — E782 Mixed hyperlipidemia: Secondary | ICD-10-CM

## 2021-11-23 ENCOUNTER — Ambulatory Visit: Payer: Medicare Other | Admitting: Cardiology

## 2021-12-04 ENCOUNTER — Ambulatory Visit: Payer: Medicare Other | Admitting: Cardiology

## 2021-12-07 ENCOUNTER — Ambulatory Visit: Payer: Medicare Other | Admitting: Cardiology

## 2021-12-07 ENCOUNTER — Other Ambulatory Visit: Payer: Self-pay

## 2021-12-07 ENCOUNTER — Encounter: Payer: Self-pay | Admitting: Cardiology

## 2021-12-07 VITALS — BP 154/71 | HR 65 | Temp 97.7°F | Resp 16 | Ht 69.0 in | Wt 165.0 lb

## 2021-12-07 DIAGNOSIS — E782 Mixed hyperlipidemia: Secondary | ICD-10-CM

## 2021-12-07 DIAGNOSIS — I10 Essential (primary) hypertension: Secondary | ICD-10-CM

## 2021-12-07 DIAGNOSIS — I6523 Occlusion and stenosis of bilateral carotid arteries: Secondary | ICD-10-CM

## 2021-12-07 MED ORDER — VALSARTAN 80 MG PO TABS: 80.0000 mg | ORAL_TABLET | Freq: Every day | ORAL | 3 refills | Status: DC

## 2021-12-07 NOTE — Progress Notes (Signed)
Patient referred by Nickola Major, MD for carotid artery stenosis.  Subjective:   Arthur Guzman, male    DOB: 12/09/1946, 75 y.o.   MRN: [id number]   Chief Complaint  Patient presents with   Hypertension   Follow-up    28 month    HPI  75 year old Caucasian male with h/o complex migraine/TIA, moderate asymptomatic right carotid stenosis.  Patient is doing well.  He has no symptoms.  Blood pressure is elevated.  He does not check regularly at home.  He had an impression that he does not need to take any blood pressure medications, as long as he is feeling well.   Current Outpatient Medications on File Prior to Visit  Medication Sig Dispense Refill   aspirin EC 81 MG EC tablet Take 1 tablet (81 mg total) by mouth daily.     atorvastatin (LIPITOR) 40 MG tablet Take 40 mg by mouth daily.     No current facility-administered medications on file prior to visit.    Cardiovascular studies:  EKG 12/07/2021: Sinus rhythm 67 bpm Left anterior fascicular block  Carotid artery duplex 03/02/2021:  Stenosis in the right internal carotid artery (50-69%). Stenosis in the  right common carotid artery (<50%).  Stenosis in the left internal carotid artery (16-49%). Stenosis in the  left common carotid artery (<50%).  Antegrade right vertebral artery flow. Antegrade left vertebral artery  flow.  Follow up in six months is appropriate if clinically indicated. No  significant change from 09/15/2020.  ABI 04/06/2021:  This exam reveals normal perfusion of the right and left lower extremity  (Rt. ABI 0.97 and Lt. ABI 1.00). Normal triphasic waveform at bilateral  ankles. Left DP waveform diminished  MRI brain 08/26/2019: 1. No acute intracranial infarct or other abnormality identified. 2. 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  CTA head/neck  08/26/2019: 1. Negative CTA for large vessel occlusion. 2. 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 3. No hemodynamically significant or correctable stenosis within the intracranial circulation. 4. Fetal type origin of the PCAs with overall diminutive vertebrobasilar system.  Echocardiogram 08/25/2018:  1. The left ventricle has normal systolic function with an ejection fraction of 60-65%. The cavity size was normal. There is moderately increased left ventricular wall thickness. Left ventricular diastolic Doppler parameters are consistent with impaired  relaxation. Indeterminate filling pressures The E/e' is 8-15. No evidence of left ventricular regional wall motion abnormalities.  2. The right ventricle has normal systolic function. The cavity was normal. There is no increase in right ventricular wall thickness.  3. The mitral valve is grossly normal.  4. The tricuspid valve is grossly normal.  5. The aortic valve is tricuspid. No stenosis of the aortic valve.  6. The aorta is normal unless otherwise noted.   Recent labs: 03/24/2021: Chol 110, TG 114, HDL 38, LDL 51  12/08/2020: Glucose 137, BUN/Cr 9/1.1. EGFR >60. Na/K 135/4.3. Rest of the CMP normal H/H 11/32. MCV 94. Platelets 137  03/09/2020: Chol 106, TG 125, HDL 35, LDL 101  08/27/2019: Glucose 93, BUN/Cr 13/1.03. EGFR >60. Na/K 139/3.2. Albumin 3.1. Rest of the CMP normal H/H 12.9/36.5. MCV 89. Platelets 188 HbA1C 5.4% Chol 169, TG 183, HDL 31, LDL 101    Review of Systems  Cardiovascular:  Negative for chest pain, dyspnea on exertion, leg  swelling, palpitations and syncope.        Vitals:   12/07/21 1057  BP: (!) 154/71  Pulse: 65  Resp: 16  Temp: 97.7 F (36.5 C)  SpO2: 96%    Body mass index is 24.37 kg/m. Filed Weights   12/07/21 1057  Weight: 165 lb (74.8 kg)     Objective:   Physical Exam Vitals and  nursing note reviewed.  Constitutional:      Appearance: He is well-developed.  Neck:     Vascular: No JVD.  Cardiovascular:     Rate and Rhythm: Normal rate and regular rhythm.     Pulses: Intact distal pulses.          Carotid pulses are  on the right side with bruit and  on the left side with bruit.      Dorsalis pedis pulses are 1+ on the right side and 1+ on the left side.       Posterior tibial pulses are 0 on the right side and 0 on the left side.     Heart sounds: Normal heart sounds. No murmur heard. Pulmonary:     Effort: Pulmonary effort is normal.     Breath sounds: Normal breath sounds. No wheezing or rales.  Musculoskeletal:     Right lower leg: No edema.     Left lower leg: No edema.          Assessment & Recommendations:   75 year old Caucasian male with h/o complex migraine/TIA, moderate asymptomatic right carotid stenosis.  Moderate Rt carotid stenosis: Stable, asymptomatic. Continue Aspirin, statin. LDL down to 51. Repeat carotid duplex in 02/2022 Patient knows to look for TIA/stroke symptoms and call 911 if they occur.  Hypertension: Uncontrolled.  Emphasized importance of blood pressure control regardless of symptoms.   In addition to continued physical activity and low-salt diet, he is open to starting losartan 50 mg daily.  Of note, he had swelling with amlodipine in the past, and had no improvement in blood pressure with chlorthalidone just does not want to try that again.   Check BMP in 1 week.  Abnormal vascular exam: Mild Lt DP waveform abnormality. Othrewise normal.   F/u in 4 weeks  [place], MD [facility] Pager: [phone] Office: [phone] If no answer Cell [phone]

## 2021-12-14 LAB — BASIC METABOLIC PANEL
BUN/Creatinine Ratio: 13 (ref 10–24)
BUN: 12 mg/dL (ref 8–27)
CO2: 28 mmol/L (ref 20–29)
Calcium: 10 mg/dL (ref 8.6–10.2)
Chloride: 103 mmol/L (ref 96–106)
Creatinine, Ser: 0.96 mg/dL (ref 0.76–1.27)
Glucose: 90 mg/dL (ref 70–99)
Potassium: 4.4 mmol/L (ref 3.5–5.2)
Sodium: 142 mmol/L (ref 134–144)
eGFR: 82 mL/min/{1.73_m2} (ref >59)

## 2022-01-04 ENCOUNTER — Other Ambulatory Visit: Payer: Self-pay

## 2022-01-04 ENCOUNTER — Ambulatory Visit: Payer: Medicare Other | Admitting: Cardiology

## 2022-01-04 ENCOUNTER — Encounter: Payer: Self-pay | Admitting: Cardiology

## 2022-01-04 VITALS — BP 155/78 | HR 71 | Temp 98.0°F | Resp 16 | Ht 69.0 in | Wt 165.0 lb

## 2022-01-04 DIAGNOSIS — E782 Mixed hyperlipidemia: Secondary | ICD-10-CM

## 2022-01-04 DIAGNOSIS — I10 Essential (primary) hypertension: Secondary | ICD-10-CM

## 2022-01-04 DIAGNOSIS — I6523 Occlusion and stenosis of bilateral carotid arteries: Secondary | ICD-10-CM

## 2022-01-04 NOTE — Progress Notes (Signed)
Patient referred by Nickola Major, MD for carotid artery stenosis.  Subjective:   Arthur Guzman, male    DOB: 31-May-1946, 76 y.o.   MRN: [id number]   Chief Complaint  Patient presents with   Hypertension   Follow-up   HPI  76 year old Caucasian male with h/o complex migraine/TIA, moderate asymptomatic right carotid stenosis.  Patient is doing well.  He has no symptoms.  Blood pressure is elevated.  He has brought his home blood pressure log that shows very well-controlled blood pressures at home.    Current Outpatient Medications on File Prior to Visit  Medication Sig Dispense Refill   aspirin EC 81 MG EC tablet Take 1 tablet (81 mg total) by mouth daily.     atorvastatin (LIPITOR) 40 MG tablet Take 40 mg by mouth daily.     No current facility-administered medications on file prior to visit.    Cardiovascular studies:  EKG 12/07/2021: Sinus rhythm 67 bpm Left anterior fascicular block  Carotid artery duplex 03/02/2021:  Stenosis in the right internal carotid artery (50-69%). Stenosis in the  right common carotid artery (<50%).  Stenosis in the left internal carotid artery (16-49%). Stenosis in the  left common carotid artery (<50%).  Antegrade right vertebral artery flow. Antegrade left vertebral artery  flow.  Follow up in six months is appropriate if clinically indicated. No  significant change from 09/15/2020.  ABI 04/06/2021:  This exam reveals normal perfusion of the right and left lower extremity  (Rt. ABI 0.97 and Lt. ABI 1.00). Normal triphasic waveform at bilateral  ankles. Left DP waveform diminished  MRI brain 08/26/2019: 1. No acute intracranial infarct or other abnormality identified. 2. 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  CTA head/neck 08/26/2019: 1. Negative CTA for large vessel occlusion. 2.  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 3. No hemodynamically significant or correctable stenosis within the intracranial circulation. 4. Fetal type origin of the PCAs with overall diminutive vertebrobasilar system.  Echocardiogram 08/25/2018:  1. The left ventricle has normal systolic function with an ejection fraction of 60-65%. The cavity size was normal. There is moderately increased left ventricular wall thickness. Left ventricular diastolic Doppler parameters are consistent with impaired  relaxation. Indeterminate filling pressures The E/e' is 8-15. No evidence of left ventricular regional wall motion abnormalities.  2. The right ventricle has normal systolic function. The cavity was normal. There is no increase in right ventricular wall thickness.  3. The mitral valve is grossly normal.  4. The tricuspid valve is grossly normal.  5. The aortic valve is tricuspid. No stenosis of the aortic valve.  6. The aorta is normal unless otherwise noted.   Recent labs: 12/13/2021: Glucose 90, BUN/Cr 12/0.96. EGFR 82. Na/K 142/4.4.   03/24/2021: Chol 110, TG 114, HDL 38, LDL 51  12/08/2020: Glucose 137, BUN/Cr 9/1.1. EGFR >60. Na/K 135/4.3. Rest of the CMP normal H/H 11/32. MCV 94. Platelets 137  03/09/2020: Chol 106, TG 125, HDL 35, LDL 101  08/27/2019: Glucose 93, BUN/Cr 13/1.03. EGFR >60. Na/K 139/3.2. Albumin 3.1. Rest of the CMP normal H/H 12.9/36.5. MCV 89. Platelets 188 HbA1C 5.4% Chol 169, TG 183, HDL 31, LDL 101    Review of Systems  Cardiovascular:  Negative for chest pain, dyspnea on exertion, leg swelling, palpitations and syncope.  Vitals:   01/04/22 1137 01/04/22 1139  BP: (!) 161/79 (!) 155/78  Pulse: 71 71  Resp: 16   Temp: 98 F (36.7 C)   SpO2: 96%     Body mass index is 24.37 kg/m. Filed Weights   01/04/22 1137  Weight: 165 lb (74.8 kg)      Objective:   Physical Exam Vitals and nursing note reviewed.  Constitutional:      Appearance: He is well-developed.  Neck:     Vascular: No JVD.  Cardiovascular:     Rate and Rhythm: Normal rate and regular rhythm.     Pulses: Intact distal pulses.          Carotid pulses are  on the right side with bruit and  on the left side with bruit.      Dorsalis pedis pulses are 1+ on the right side and 1+ on the left side.       Posterior tibial pulses are 0 on the right side and 0 on the left side.     Heart sounds: Normal heart sounds. No murmur heard. Pulmonary:     Effort: Pulmonary effort is normal.     Breath sounds: Normal breath sounds. No wheezing or rales.  Musculoskeletal:     Right lower leg: No edema.     Left lower leg: No edema.          Assessment & Recommendations:   76 year old Caucasian male with h/o complex migraine/TIA, moderate asymptomatic right carotid stenosis.  Moderate Rt carotid stenosis: Stable, asymptomatic. Continue Aspirin, statin. LDL down to 51. Repeat carotid duplex in 02/2022 Patient knows to look for TIA/stroke symptoms and call 911 if they occur.  Hypertension: Suspect whitecoat hypertension with normal blood pressures at home and elevated blood pressure here in the office. Okay to hold off any medical therapy at this time. Recommend follow-up visit in 3 months.  Have encouraged him to bring his blood pressure monitor, as well as blood pressure log to compare.  Abnormal vascular exam: Mild Lt DP waveform abnormality. Othrewise normal.   F/u in 3 months   [place], MD [facility] Pager: [phone] Office: [phone] If no answer Cell [phone]

## 2022-01-24 DIAGNOSIS — M25511 Pain in right shoulder: Secondary | ICD-10-CM | POA: Diagnosis not present

## 2022-02-26 ENCOUNTER — Other Ambulatory Visit: Payer: Medicare Other

## 2022-02-28 ENCOUNTER — Other Ambulatory Visit: Payer: Self-pay

## 2022-02-28 ENCOUNTER — Ambulatory Visit: Payer: Medicare Other

## 2022-02-28 DIAGNOSIS — I6523 Occlusion and stenosis of bilateral carotid arteries: Secondary | ICD-10-CM

## 2022-04-04 ENCOUNTER — Ambulatory Visit: Payer: Medicare Other | Admitting: Cardiology

## 2022-04-04 ENCOUNTER — Encounter: Payer: Self-pay | Admitting: Cardiology

## 2022-04-04 VITALS — BP 145/71 | HR 69 | Temp 98.0°F | Resp 16 | Ht 69.0 in | Wt 166.0 lb

## 2022-04-04 DIAGNOSIS — I6521 Occlusion and stenosis of right carotid artery: Secondary | ICD-10-CM

## 2022-04-04 DIAGNOSIS — E782 Mixed hyperlipidemia: Secondary | ICD-10-CM

## 2022-04-04 DIAGNOSIS — I10 Essential (primary) hypertension: Secondary | ICD-10-CM | POA: Diagnosis not present

## 2022-04-04 NOTE — Progress Notes (Signed)
? ? ?Patient referred by Nickola Major, MD for carotid artery stenosis. ? ?Subjective:  ? ?Arthur Guzman, male    DOB: 1946/06/19, 76 y.o.   MRN: [id number] ? ? ?Chief Complaint  ?Patient presents with  ? Hypertension  ? Follow-up  ?  3 month  ? ?HPI ? ?76 year old Caucasian male with h/o complex migraine/TIA, moderate asymptomatic right carotid stenosis. ? ?Patient is doing well.  He has no symptoms.  He admits to not checking blood pressure regularly at home.  ? ? ?Current Outpatient Medications:  ?  aspirin EC 81 MG EC tablet, Take 1 tablet (81 mg total) by mouth daily., Disp:  , Rfl:  ?  atorvastatin (LIPITOR) 40 MG tablet, Take 40 mg by mouth daily., Disp: , Rfl:  ? ? ? ?Cardiovascular studies: ? ?EKG 12/07/2021: ?Sinus rhythm 67 bpm ?Left anterior fascicular block ? ?Carotid artery duplex 03/02/2021:  ?Stenosis in the right internal carotid artery (50-69%). Stenosis in the  ?right common carotid artery (<50%).  ?Stenosis in the left internal carotid artery (16-49%). Stenosis in the  ?left common carotid artery (<50%).  ?Antegrade right vertebral artery flow. Antegrade left vertebral artery  ?flow.  ?Follow up in six months is appropriate if clinically indicated. No  ?significant change from 09/15/2020. ? ?ABI 04/06/2021:  ?This exam reveals normal perfusion of the right and left lower extremity  ?(Rt. ABI 0.97 and Lt. ABI 1.00). Normal triphasic waveform at bilateral  ?ankles. Left DP waveform diminished ? ?MRI brain 08/26/2019: ?1. No acute intracranial infarct or other abnormality identified. ?2. Age-related cerebral atrophy with moderate chronic microvascular ?ischemic disease, with superimposed small remote lacunar infarcts ?involving the bilateral caudate nuclei. ? ?CTA head/neck 08/26/2019: ?1. Negative CTA for large vessel occlusion. ?2. Atherosclerotic change about the proximal cervical ICAs ?bilaterally, with associated stenoses of up to 65% on the right and ?55% on the left. ?3. No hemodynamically  significant or correctable stenosis within the ?intracranial circulation. ?4. Fetal type origin of the PCAs with overall diminutive ?vertebrobasilar system. ? ?Echocardiogram 08/25/2018: ? 1. The left ventricle has normal systolic function with an ejection fraction of 60-65%. The cavity size was normal. There is moderately increased left ventricular wall thickness. Left ventricular diastolic Doppler parameters are consistent with impaired ? relaxation. Indeterminate filling pressures The E/e' is 8-15. No evidence of left ventricular regional wall motion abnormalities. ? 2. The right ventricle has normal systolic function. The cavity was normal. There is no increase in right ventricular wall thickness. ? 3. The mitral valve is grossly normal. ? 4. The tricuspid valve is grossly normal. ? 5. The aortic valve is tricuspid. No stenosis of the aortic valve. ? 6. The aorta is normal unless otherwise noted.  ? ?Recent labs: ?12/13/2021: ?Glucose 90, BUN/Cr 12/0.96. EGFR 82. Na/K 142/4.4.  ? ?03/24/2021: ?Chol 110, TG 114, HDL 38, LDL 51 ? ?12/08/2020: ?Glucose 137, BUN/Cr 9/1.1. EGFR >60. Na/K 135/4.3. Rest of the CMP normal ?H/H 11/32. MCV 94. Platelets 137 ? ?03/09/2020: ?Chol 106, TG 125, HDL 35, LDL 101 ? ?08/27/2019: ?Glucose 93, BUN/Cr 13/1.03. EGFR >60. Na/K 139/3.2. Albumin 3.1. Rest of the CMP normal ?H/H 12.9/36.5. MCV 89. Platelets 188 ?HbA1C 5.4% ?Chol 169, TG 183, HDL 31, LDL 101 ? ? ? ?Review of Systems  ?Cardiovascular:  Negative for chest pain, dyspnea on exertion, leg swelling, palpitations and syncope.  ? ?   ? ? ?Vitals:  ? 04/04/22 1023  ?BP: (!) 145/71  ?Pulse: 69  ?Resp: 16  ?Temp: 98 ?  F (36.7 ?C)  ?SpO2: 98%  ? ? ?Body mass index is 24.51 kg/m?. ?Filed Weights  ? 04/04/22 1023  ?Weight: 166 lb (75.3 kg)  ? ? ? ?Objective:  ? Physical Exam ?Vitals and nursing note reviewed.  ?Constitutional:   ?   Appearance: He is well-developed.  ?Neck:  ?   Vascular: No JVD.  ?Cardiovascular:  ?   Rate and Rhythm: Normal  rate and regular rhythm.  ?   Pulses: Intact distal pulses.     ?     Carotid pulses are  on the right side with bruit and  on the left side with bruit. ?     Dorsalis pedis pulses are 1+ on the right side and 1+ on the left side.  ?     Posterior tibial pulses are 0 on the right side and 0 on the left side.  ?   Heart sounds: Normal heart sounds. No murmur heard. ?Pulmonary:  ?   Effort: Pulmonary effort is normal.  ?   Breath sounds: Normal breath sounds. No wheezing or rales.  ?Musculoskeletal:  ?   Right lower leg: No edema.  ?   Left lower leg: No edema.  ? ? ? ? ? ?   ?Assessment & Recommendations:  ? ?76 year old Caucasian male with h/o complex migraine/TIA, moderate asymptomatic right carotid stenosis. ? ?Moderate Rt carotid stenosis: ?Stable, asymptomatic. Continue Aspirin, statin. ?Repeat carotid duplex in 02/2023 ?Patient knows to look for TIA/stroke symptoms and call 911 if they occur. ? ?Hypertension: ?Suspect whitecoat hypertension with normal blood pressures at home and elevated blood pressure here in the office. ?Okay to hold off any medical therapy at this time. ?Encouraged him to check his blood pressure readings regularly at home. ? ? ?F/u in 3 months  ? ?Nigel Mormon, MD ?[facility] ?Pager: [phone] ?Office: [phone] ?If no answer Cell [phone] ?   ?

## 2022-07-16 DIAGNOSIS — H401121 Primary open-angle glaucoma, left eye, mild stage: Secondary | ICD-10-CM | POA: Diagnosis not present

## 2022-07-31 DIAGNOSIS — L821 Other seborrheic keratosis: Secondary | ICD-10-CM | POA: Diagnosis not present

## 2022-07-31 DIAGNOSIS — Z85828 Personal history of other malignant neoplasm of skin: Secondary | ICD-10-CM | POA: Diagnosis not present

## 2022-07-31 DIAGNOSIS — L820 Inflamed seborrheic keratosis: Secondary | ICD-10-CM | POA: Diagnosis not present

## 2022-07-31 DIAGNOSIS — D225 Melanocytic nevi of trunk: Secondary | ICD-10-CM | POA: Diagnosis not present

## 2022-10-16 DIAGNOSIS — H401121 Primary open-angle glaucoma, left eye, mild stage: Secondary | ICD-10-CM | POA: Diagnosis not present

## 2022-11-13 DIAGNOSIS — H401121 Primary open-angle glaucoma, left eye, mild stage: Secondary | ICD-10-CM | POA: Diagnosis not present

## 2022-11-15 DIAGNOSIS — Z0000 Encounter for general adult medical examination without abnormal findings: Secondary | ICD-10-CM | POA: Diagnosis not present

## 2022-11-15 DIAGNOSIS — I6521 Occlusion and stenosis of right carotid artery: Secondary | ICD-10-CM | POA: Diagnosis not present

## 2022-11-15 DIAGNOSIS — E538 Deficiency of other specified B group vitamins: Secondary | ICD-10-CM | POA: Diagnosis not present

## 2022-11-15 DIAGNOSIS — I10 Essential (primary) hypertension: Secondary | ICD-10-CM | POA: Diagnosis not present

## 2022-12-28 DIAGNOSIS — H2512 Age-related nuclear cataract, left eye: Secondary | ICD-10-CM | POA: Diagnosis not present

## 2022-12-28 DIAGNOSIS — Z961 Presence of intraocular lens: Secondary | ICD-10-CM | POA: Diagnosis not present

## 2022-12-28 DIAGNOSIS — H401112 Primary open-angle glaucoma, right eye, moderate stage: Secondary | ICD-10-CM | POA: Diagnosis not present

## 2022-12-28 DIAGNOSIS — H401123 Primary open-angle glaucoma, left eye, severe stage: Secondary | ICD-10-CM | POA: Diagnosis not present

## 2023-01-10 DIAGNOSIS — H2512 Age-related nuclear cataract, left eye: Secondary | ICD-10-CM | POA: Diagnosis not present

## 2023-01-10 DIAGNOSIS — H401123 Primary open-angle glaucoma, left eye, severe stage: Secondary | ICD-10-CM | POA: Diagnosis not present

## 2023-02-10 DIAGNOSIS — H2512 Age-related nuclear cataract, left eye: Secondary | ICD-10-CM | POA: Diagnosis not present

## 2023-03-05 DIAGNOSIS — M25512 Pain in left shoulder: Secondary | ICD-10-CM | POA: Diagnosis not present

## 2023-03-06 ENCOUNTER — Other Ambulatory Visit: Payer: Medicare Other

## 2023-03-11 ENCOUNTER — Ambulatory Visit: Payer: Medicare Other

## 2023-03-11 DIAGNOSIS — I6521 Occlusion and stenosis of right carotid artery: Secondary | ICD-10-CM | POA: Diagnosis not present

## 2023-03-13 ENCOUNTER — Encounter: Payer: Self-pay | Admitting: Cardiology

## 2023-03-13 ENCOUNTER — Ambulatory Visit: Payer: Medicare Other | Admitting: Cardiology

## 2023-03-13 VITALS — BP 148/72 | HR 66 | Resp 16 | Ht 69.0 in | Wt 163.0 lb

## 2023-03-13 DIAGNOSIS — I6521 Occlusion and stenosis of right carotid artery: Secondary | ICD-10-CM | POA: Diagnosis not present

## 2023-03-13 DIAGNOSIS — I10 Essential (primary) hypertension: Secondary | ICD-10-CM | POA: Diagnosis not present

## 2023-03-13 NOTE — Progress Notes (Signed)
Patient referred by Nickola Major, MD for carotid artery stenosis.  Subjective:   Arthur Guzman, male    DOB: [birth year], 76 y.o.   MRN: [id number]   Chief Complaint  Patient presents with   Asymptomatic stenosis of right carotid artery   Hypertension   Follow-up   HPI  77 year old Caucasian male with h/o complex migraine/TIA, moderate asymptomatic right carotid stenosis.  Patient is doing well.  He has no symptoms.  Blood pressure is elevated today.  He states that it is lower, but still up and down at home.  His wife is a Marine scientist and checks his blood pressure regularly.  He is very reluctant to start any antihypertensive medication at this time.  He denies any stroke/TIA symptoms.  On specification and regarding diet, he does endorse eating salted peanuts and notable soup frequently.   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 Duplex suggests stenosis in the left internal carotid artery (16-49%). The right PSV internal/common carotid artery ratio is consistent with a stenosis of >70%. Antegrade right vertebral artery flow. Antegrade left vertebral artery flow. Follow up in six months is appropriate if clinically indicated.  ABI 04/06/2021:  This exam reveals normal perfusion of the right and left lower extremity  (Rt. ABI 0.97 and Lt. ABI 1.00). Normal triphasic waveform at bilateral  ankles. Left DP waveform diminished  MRI brain 08/26/2019: 1. No acute intracranial infarct or other abnormality identified. 2. Age-related cerebral atrophy with moderate chronic microvascular ischemic disease, with superimposed  small remote lacunar infarcts involving the bilateral caudate nuclei.  CTA head/neck 08/26/2019: 1. Negative CTA for large vessel occlusion. 2. 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 3. No hemodynamically significant or correctable stenosis within the intracranial circulation. 4. Fetal type origin of the PCAs with overall diminutive vertebrobasilar system.  Echocardiogram 08/25/2018:  1. The left ventricle has normal systolic function with an ejection fraction of 60-65%. The cavity size was normal. There is moderately increased left ventricular wall thickness. Left ventricular diastolic Doppler parameters are consistent with impaired  relaxation. Indeterminate filling pressures The E/e' is 8-15. No evidence of left ventricular regional wall motion abnormalities.  2. The right ventricle has normal systolic function. The cavity was normal. There is no increase in right ventricular wall thickness.  3. The mitral valve is grossly normal.  4. The tricuspid valve is grossly normal.  5. The aortic valve is tricuspid. No stenosis of the aortic valve.  6. The aorta is normal unless otherwise noted.   Recent labs: 12/13/2021: Glucose 90, BUN/Cr 12/0.96. EGFR 82. Na/K 142/4.4.   03/24/2021: Chol 110, TG 114, HDL 38, LDL 51  12/08/2020: Glucose 137, BUN/Cr 9/1.1. EGFR >60. Na/K 135/4.3. Rest of the CMP normal H/H 11/32. MCV 94. Platelets 137  03/09/2020: Chol 106, TG 125, HDL 35, LDL 101  08/27/2019: Glucose 93, BUN/Cr 13/1.03. EGFR >60. Na/K 139/3.2. Albumin 3.1. Rest of the CMP normal H/H 12.9/36.5. MCV 89. Platelets 188 HbA1C 5.4% Chol 169, TG 183, HDL 31, LDL 101  Review of Systems  Cardiovascular:  Negative for chest pain, dyspnea on exertion, leg swelling, palpitations and syncope.         Vitals:   03/13/23 1011 03/13/23 1016  BP: (!) 151/70 (!) 148/72  Pulse: 66 66  Resp:  16   SpO2: 98% 98%    Body mass index is 24.07 kg/m. Filed Weights   03/13/23 1011  Weight: 163 lb (73.9 kg)     Objective:   Physical Exam Vitals and nursing note reviewed.  Constitutional:      General: He is not in acute distress.    Appearance: He is well-developed.  Neck:     Vascular: No JVD.  Cardiovascular:     Rate and Rhythm: Normal rate and regular rhythm.     Pulses: Intact distal pulses.          Carotid pulses are  on the right side with bruit and  on the left side with bruit.      Dorsalis pedis pulses are 1+ on the right side and 1+ on the left side.       Posterior tibial pulses are 0 on the right side and 0 on the left side.     Heart sounds: Normal heart sounds. No murmur heard. Pulmonary:     Effort: Pulmonary effort is normal.     Breath sounds: Normal breath sounds. No wheezing or rales.  Musculoskeletal:     Right lower leg: No edema.     Left lower leg: No edema.           Assessment & Recommendations:   77 year old Caucasian male with h/o complex migraine/TIA, moderate asymptomatic right carotid stenosis.  Severe Rt carotid stenosis: Rt ICA >70% stenosis Asymptomatic. Continue Aspirin, statin. Repeat carotid duplex in 08/2023 Patient knows to look for TIA/stroke symptoms and call 911 if they occur. Continue aspirin, statin. Check labs with lipid panel.  Hypertension: Suspect whitecoat hypertension with normal blood pressures at home and elevated blood pressure here in the office. He does not want to start any medical therapy at this time. Discussed regarding reducing/stopping eating salted peanuts and noodle soup.  F/u in 3 months    Nigel Mormon, MD Pager: [phone] Office: [phone]

## 2023-03-15 DIAGNOSIS — I6521 Occlusion and stenosis of right carotid artery: Secondary | ICD-10-CM | POA: Diagnosis not present

## 2023-03-16 LAB — LIPID PANEL
Chol/HDL Ratio: 2.1 ratio (ref 0.0–5.0)
Cholesterol, Total: 97 mg/dL — ABNORMAL LOW (ref 100–199)
HDL: 47 mg/dL (ref >39)
LDL Chol Calc (NIH): 35 mg/dL (ref 0–99)
Triglycerides: 66 mg/dL (ref 0–149)
VLDL Cholesterol Cal: 15 mg/dL (ref 5–40)

## 2023-06-03 ENCOUNTER — Ambulatory Visit: Payer: Medicare Other | Admitting: Cardiology

## 2023-06-03 ENCOUNTER — Encounter: Payer: Self-pay | Admitting: Cardiology

## 2023-06-03 VITALS — BP 154/76 | HR 84 | Ht 69.0 in | Wt 168.0 lb

## 2023-06-03 DIAGNOSIS — E782 Mixed hyperlipidemia: Secondary | ICD-10-CM

## 2023-06-03 DIAGNOSIS — I6521 Occlusion and stenosis of right carotid artery: Secondary | ICD-10-CM

## 2023-06-03 DIAGNOSIS — I10 Essential (primary) hypertension: Secondary | ICD-10-CM

## 2023-06-03 MED ORDER — LOSARTAN POTASSIUM 25 MG PO TABS: 25.0000 mg | ORAL_TABLET | Freq: Every day | ORAL | 3 refills | Status: DC

## 2023-06-03 NOTE — Progress Notes (Signed)
Patient referred by Gwenlyn Found, MD for carotid artery stenosis.  Subjective:   Arthur Guzman, male    DOB: 09/24/1946, 77 y.o.   MRN: [id number]   Chief Complaint  Patient presents with   Hypertension   Follow-up   HPI  77 year old Caucasian male with hypertension, controlled hyperlipidemia, h/o complex migraine/TIA, asymptomatic right carotid stenosis.  Patient is doing well.  He has no symptoms.  Blood pressure around 140s at home.     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 Duplex suggests stenosis in the left internal carotid artery (16-49%). The right PSV internal/common carotid artery ratio is consistent with a stenosis of >70%. Antegrade right vertebral artery flow. Antegrade left vertebral artery flow. Follow up in six months is appropriate if clinically indicated.  ABI 04/06/2021:  This exam reveals normal perfusion of the right and left lower extremity  (Rt. ABI 0.97 and Lt. ABI 1.00). Normal triphasic waveform at bilateral  ankles. Left DP waveform diminished  MRI brain 08/26/2019: 1. No acute intracranial infarct or other abnormality identified. 2. 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  CTA head/neck 08/26/2019: 1. Negative CTA for large vessel occlusion. 2. 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 3. No hemodynamically significant or correctable stenosis  within the intracranial circulation. 4. Fetal type origin of the PCAs with overall diminutive vertebrobasilar system.  Echocardiogram 08/25/2018:  1. The left ventricle has normal systolic function with an ejection fraction of 60-65%. The cavity size was normal. There is moderately increased left ventricular wall thickness. Left ventricular diastolic Doppler parameters are consistent with impaired  relaxation. Indeterminate filling pressures The E/e' is 8-15. No evidence of left ventricular regional wall motion abnormalities.  2. The right ventricle has normal systolic function. The cavity was normal. There is no increase in right ventricular wall thickness.  3. The mitral valve is grossly normal.  4. The tricuspid valve is grossly normal.  5. The aortic valve is tricuspid. No stenosis of the aortic valve.  6. The aorta is normal unless otherwise noted.   Recent labs: 03/15/2023: Chol 97, TG 66, HDL 47, LDL 35  12/13/2021: Glucose 90, BUN/Cr 12/0.96. EGFR 82. Na/K 142/4.4.     Review of Systems  Cardiovascular:  Negative for chest pain, dyspnea on exertion, leg swelling, palpitations and syncope.         Vitals:   06/03/23 1108  BP: (!) 154/76  Pulse: 84  SpO2: 95%    Body mass index is 24.81 kg/m. Filed Weights   06/03/23 1108  Weight: 168 lb (76.2 kg)     Objective:   Physical Exam Vitals and nursing note reviewed.  Constitutional:      General: He is not in acute distress.    Appearance: He is well-developed.  Neck:     Vascular: No JVD.  Cardiovascular:     Rate and Rhythm: Normal rate and regular rhythm.     Pulses:  Intact distal pulses.          Carotid pulses are  on the right side with bruit and  on the left side with bruit.      Dorsalis pedis pulses are 1+ on the right side and 1+ on the left side.       Posterior tibial pulses are 0 on the right side and 0 on the left side.     Heart sounds: Normal heart sounds. No murmur heard. Pulmonary:      Effort: Pulmonary effort is normal.     Breath sounds: Normal breath sounds. No wheezing or rales.  Musculoskeletal:     Right lower leg: No edema.     Left lower leg: No edema.           Assessment & Recommendations:   77 year old Caucasian male with hypertension, controlled hyperlipidemia, h/o complex migraine/TIA, asymptomatic right carotid stenosis.  Mixed hyperlipidemia: Very well controlled. Continue Lipitor 40 mg daily.  Severe Rt carotid stenosis: Rt ICA >70% stenosis Asymptomatic. Continue Aspirin, statin. Repeat carotid duplex in 08/2023 Patient knows to look for TIA/stroke symptoms and call 911 if they occur. Continue aspirin, statin.  Hypertension: Added losartan 25 mg daily. Check BMP in 1 week/   F/u in 3 months     Elder Negus, MD Pager: [phone] Office: [phone]

## 2023-06-12 DIAGNOSIS — I10 Essential (primary) hypertension: Secondary | ICD-10-CM | POA: Diagnosis not present

## 2023-06-13 LAB — BASIC METABOLIC PANEL
BUN/Creatinine Ratio: 15 (ref 10–24)
BUN: 14 mg/dL (ref 8–27)
CO2: 21 mmol/L (ref 20–29)
Calcium: 9.2 mg/dL (ref 8.6–10.2)
Chloride: 105 mmol/L (ref 96–106)
Creatinine, Ser: 0.93 mg/dL (ref 0.76–1.27)
Glucose: 98 mg/dL (ref 70–99)
Potassium: 4.2 mmol/L (ref 3.5–5.2)
Sodium: 144 mmol/L (ref 134–144)
eGFR: 85 mL/min/{1.73_m2} (ref >59)

## 2023-08-09 ENCOUNTER — Ambulatory Visit: Payer: Medicare Other | Admitting: Cardiology

## 2023-08-10 ENCOUNTER — Emergency Department (HOSPITAL_COMMUNITY): Payer: Medicare Other

## 2023-08-10 ENCOUNTER — Emergency Department (HOSPITAL_COMMUNITY)
Admission: EM | Admit: 2023-08-10 | Discharge: 2023-08-10 | Disposition: A | Discharge status: Home / Self Care | Payer: Medicare Other | Attending: Emergency Medicine | Admitting: Emergency Medicine

## 2023-08-10 ENCOUNTER — Encounter (HOSPITAL_COMMUNITY): Payer: Self-pay

## 2023-08-10 ENCOUNTER — Other Ambulatory Visit: Payer: Self-pay

## 2023-08-10 DIAGNOSIS — R42 Dizziness and giddiness: Secondary | ICD-10-CM | POA: Insufficient documentation

## 2023-08-10 DIAGNOSIS — I10 Essential (primary) hypertension: Secondary | ICD-10-CM | POA: Diagnosis not present

## 2023-08-10 DIAGNOSIS — Z8673 Personal history of transient ischemic attack (TIA), and cerebral infarction without residual deficits: Secondary | ICD-10-CM | POA: Insufficient documentation

## 2023-08-10 DIAGNOSIS — I6782 Cerebral ischemia: Secondary | ICD-10-CM | POA: Insufficient documentation

## 2023-08-10 DIAGNOSIS — I6523 Occlusion and stenosis of bilateral carotid arteries: Secondary | ICD-10-CM | POA: Insufficient documentation

## 2023-08-10 DIAGNOSIS — Z7982 Long term (current) use of aspirin: Secondary | ICD-10-CM | POA: Diagnosis not present

## 2023-08-10 DIAGNOSIS — R202 Paresthesia of skin: Secondary | ICD-10-CM | POA: Insufficient documentation

## 2023-08-10 DIAGNOSIS — Z79899 Other long term (current) drug therapy: Secondary | ICD-10-CM | POA: Insufficient documentation

## 2023-08-10 LAB — CBC WITH DIFFERENTIAL/PLATELET
Abs Immature Granulocytes: 0.02 10*3/uL (ref 0.00–0.07)
Basophils Absolute: 0 10*3/uL (ref 0.0–0.1)
Basophils Relative: 0 %
Eosinophils Absolute: 0 10*3/uL (ref 0.0–0.5)
Eosinophils Relative: 0 %
HCT: 35.9 % — ABNORMAL LOW (ref 39.0–52.0)
Hemoglobin: 12.2 g/dL — ABNORMAL LOW (ref 13.0–17.0)
Immature Granulocytes: 0 %
Lymphocytes Relative: 20 %
Lymphs Abs: 1.6 10*3/uL (ref 0.7–4.0)
MCH: 31.2 pg (ref 26.0–34.0)
MCHC: 34 g/dL (ref 30.0–36.0)
MCV: 91.8 fL (ref 80.0–100.0)
Monocytes Absolute: 0.9 10*3/uL (ref 0.1–1.0)
Monocytes Relative: 12 %
Neutro Abs: 5.2 10*3/uL (ref 1.7–7.7)
Neutrophils Relative %: 68 %
Platelets: 175 10*3/uL (ref 150–400)
RBC: 3.91 MIL/uL — ABNORMAL LOW (ref 4.22–5.81)
RDW: 13.2 % (ref 11.5–15.5)
WBC: 7.7 10*3/uL (ref 4.0–10.5)
nRBC: 0 % (ref 0.0–0.2)

## 2023-08-10 LAB — COMPREHENSIVE METABOLIC PANEL
ALT: 18 U/L (ref 0–44)
AST: 21 U/L (ref 15–41)
Albumin: 3.2 g/dL — ABNORMAL LOW (ref 3.5–5.0)
Alkaline Phosphatase: 96 U/L (ref 38–126)
Anion gap: 7 (ref 5–15)
BUN: 13 mg/dL (ref 8–23)
CO2: 26 mmol/L (ref 22–32)
Calcium: 8.6 mg/dL — ABNORMAL LOW (ref 8.9–10.3)
Chloride: 104 mmol/L (ref 98–111)
Creatinine, Ser: 1 mg/dL (ref 0.61–1.24)
GFR, Estimated: >60 mL/min (ref >60)
Glucose, Bld: 101 mg/dL — ABNORMAL HIGH (ref 70–99)
Potassium: 4 mmol/L (ref 3.5–5.1)
Sodium: 137 mmol/L (ref 135–145)
Total Bilirubin: 0.6 mg/dL (ref 0.3–1.2)
Total Protein: 6.3 g/dL — ABNORMAL LOW (ref 6.5–8.1)

## 2023-08-10 LAB — MAGNESIUM: Magnesium: 2 mg/dL (ref 1.7–2.4)

## 2023-08-10 MED ORDER — IOHEXOL 350 MG/ML SOLN
75.0000 mL | Freq: Once | INTRAVENOUS | Status: AC | PRN
  Administered 2023-08-10: 75 mL via INTRAVENOUS

## 2023-08-10 NOTE — Discharge Instructions (Signed)
You were seen in the emergency department for your dizziness and your tingling in your arms and your legs.  Your workup showed no signs of severe dehydration or abnormal electrolytes and no signs of stroke.  This is likely related to your recent vomiting and diarrheal illness and you should continue to drink plenty of fluids to stay well-hydrated.  You should follow-up with your primary doctor in the next few days to have your symptoms rechecked.  You should return to the emergency department if you pass out, you have numbness or weakness on one side the body compared to the other or if you have any other new or concerning symptoms.

## 2023-08-10 NOTE — ED Notes (Signed)
Pt verbalized understanding of discharge instructions, pts belonging returned to patient. Pt A/Ox4 ambulated to ED entrance with family member

## 2023-08-10 NOTE — ED Notes (Signed)
Pt noted to walk to restroom w/o assistance or difficulty.

## 2023-08-10 NOTE — ED Provider Notes (Signed)
[place] EMERGENCY DEPARTMENT AT [facility] Provider Note   CSN: [id number] Arrival date & time: 08/10/23  2440     History  Chief Complaint  Patient presents with   Numbness   Dizziness    Arthur Guzman is a 77 y.o. male.  Patient is a 77 year old male with a past medical history of TIA, hypertension and carotid artery stenosis presenting to the emergency department with dizziness and paresthesias.  The patient states that about a week ago he was sick with a GI bug and had nausea and vomiting with a few episodes of diarrhea.  He states that the symptoms have resolved but since then has had intermittent dizziness and paresthesias in his arms and legs.  He reports that he has been feeling the paresthesias in his hands and forearms and in his bilateral legs, happening in the arms more frequently than the legs.  He states that will happen randomly and does not seem to be associated with any position changes or times of the day.  He states that he feels lightheaded like he might pass out.  He denies any room spinning dizziness or feelings of off balance.  He denies any weakness.  He states that he has had some mild nausea but denies any abdominal pain.  He denies any headache.  The history is provided by the patient.  Dizziness      Home Medications Prior to Admission medications   Medication Sig Start Date End Date Taking? Authorizing Provider  aspirin EC 81 MG EC tablet Take 1 tablet (81 mg total) by mouth daily. 08/27/19   Black, Lesle Chris, NP  atorvastatin (LIPITOR) 40 MG tablet Take 40 mg by mouth daily. 01/29/20   [provider]  losartan (COZAAR) 25 MG tablet Take 1 tablet (25 mg total) by mouth daily. 06/03/23 09/01/23  Patwardhan, Anabel Bene, MD      Allergies    Antihistamines, chlorpheniramine-type    Review of Systems   Review of Systems  Neurological:  Positive for dizziness.    Physical Exam Updated Vital Signs BP 130/67   Pulse (!) 58    Temp 98.2 F (36.8 C) (Oral)   Resp 14   Ht 5\' 9"  (1.753 m)   Wt 74.8 kg   SpO2 100%   BMI 24.37 kg/m  Physical Exam Vitals and nursing note reviewed.  Constitutional:      General: He is not in acute distress.    Appearance: Normal appearance.  HENT:     Head: Normocephalic and atraumatic.     Nose: Nose normal.     Mouth/Throat:     Mouth: Mucous membranes are moist.     Pharynx: Oropharynx is clear.  Eyes:     Extraocular Movements: Extraocular movements intact.     Conjunctiva/sclera: Conjunctivae normal.     Pupils: Pupils are equal, round, and reactive to light.     Comments: No nystagmus  Cardiovascular:     Rate and Rhythm: Normal rate and regular rhythm.     Heart sounds: Normal heart sounds.  Pulmonary:     Effort: Pulmonary effort is normal.     Breath sounds: Normal breath sounds.  Abdominal:     General: Abdomen is flat.     Palpations: Abdomen is soft.     Tenderness: There is no abdominal tenderness.  Musculoskeletal:        General: Normal range of motion.     Cervical back: Normal range of motion and  neck supple.  Skin:    General: Skin is warm and dry.  Neurological:     General: No focal deficit present.     Mental Status: He is alert and oriented to person, place, and time.     Cranial Nerves: No cranial nerve deficit.     Sensory: No sensory deficit.     Motor: No weakness.     Coordination: Coordination normal.  Psychiatric:        Mood and Affect: Mood normal.        Behavior: Behavior normal.     ED Results / Procedures / Treatments   Labs (all labs ordered are listed, but only abnormal results are displayed) Labs Reviewed  COMPREHENSIVE METABOLIC PANEL - Abnormal; Notable for the following components:      Result Value   Glucose, Bld 101 (*)    Calcium 8.6 (*)    Total Protein 6.3 (*)    Albumin 3.2 (*)    All other components within normal limits  CBC WITH DIFFERENTIAL/PLATELET - Abnormal; Notable for the following components:    RBC 3.91 (*)    Hemoglobin 12.2 (*)    HCT 35.9 (*)    All other components within normal limits  MAGNESIUM    EKG EKG Interpretation Date/Time:  Saturday August 10 2023 09:15:14 EDT Ventricular Rate:  70 PR Interval:  170 QRS Duration:  98 QT Interval:  381 QTC Calculation: 412 R Axis:   -47  Text Interpretation: Sinus rhythm Left anterior fascicular block No significant change since last tracing Confirmed by Elayne Snare ([id number]) on 08/10/2023 9:49:00 AM  Radiology CT Angio Head Neck W WO CM  Result Date: 08/10/2023 CLINICAL DATA:  Transient ischemic attack (TIA). Dizziness. Numbness and tingling in the arms and legs. EXAM: CT ANGIOGRAPHY HEAD AND NECK WITH AND WITHOUT CONTRAST TECHNIQUE: Multidetector CT imaging of the head and neck was performed using the standard protocol during bolus administration of intravenous contrast. Multiplanar CT image reconstructions and MIPs were obtained to evaluate the vascular anatomy. Carotid stenosis measurements (when applicable) are obtained utilizing NASCET criteria, using the distal internal carotid diameter as the denominator. RADIATION DOSE REDUCTION: This exam was performed according to the departmental dose-optimization program which includes automated exposure control, adjustment of the mA and/or kV according to patient size and/or use of iterative reconstruction technique. CONTRAST:  75mL OMNIPAQUE IOHEXOL 350 MG/ML SOLN COMPARISON:  Head MRI 08/26/2019.  Head and neck CTA 08/25/2019. FINDINGS: CT HEAD FINDINGS Brain: There is no evidence of an acute infarct, intracranial hemorrhage, mass, midline shift, or extra-axial fluid collection. There is mild cerebral atrophy. Patchy hypodensities in the cerebral white matter are similar to the prior CT and are nonspecific but compatible with mild-to-moderate chronic small vessel ischemic disease. There are unchanged chronic lacunar infarcts in both caudate nuclei. Vascular: Calcified atherosclerosis  at the skull base. Skull: No acute fracture or suspicious osseous lesion. Sinuses/Orbits: Clear paranasal sinuses. Trace right mastoid fluid. Bilateral cataract extraction. Other: None. Review of the MIP images confirms the above findings CTA NECK FINDINGS Aortic arch: Standard 3 vessel aortic arch with widely patent arch vessel origins. Right carotid system: Patent with increased, mixed soft and calcified plaque in the carotid bulb resulting in prominent luminal irregularity with multifocal plaque ulceration and increased, 75% stenosis of the proximal ICA. Left carotid system: Patent with progressive soft plaque at the carotid bifurcation resulting in increased, 70% stenosis of the ICA origin. Vertebral arteries: Patent without evidence of stenosis or dissection. Dominant  right vertebral artery. Skeleton: No acute osseous abnormality or suspicious osseous lesion. Other neck: No evidence of cervical lymphadenopathy or mass. Upper chest: No mass or consolidation in the included lung apices. Review of the MIP images confirms the above findings CTA HEAD FINDINGS Anterior circulation: The internal carotid arteries are patent from skull base to carotid termini with mild atherosclerotic plaque bilaterally not resulting in significant stenosis. ACAs and MCAs are patent without evidence of a proximal branch occlusion or significant proximal stenosis. The right A1 segment is hypoplastic. No aneurysm is identified. Posterior circulation: The intracranial vertebral arteries are widely patent to the basilar, with the left vertebral artery being hypoplastic distal to the PICA origin. Patent PICA and SCA origins are visualized bilaterally. The basilar artery is widely patent. There are large posterior communicating arteries and hypoplastic P1 segments bilaterally. Both PCAs are patent without evidence of a significant proximal stenosis. No aneurysm is identified. Venous sinuses: As permitted by contrast timing, patent. Anatomic  variants: Predominantly fetal type origin of the PCAs. Review of the MIP images confirms the above findings IMPRESSION: 1. No evidence of acute intracranial abnormality. Mild-to-moderate chronic small vessel ischemic disease. 2. No large vessel occlusion. 3. Progressive atherosclerosis in the neck with 75% proximal right ICA and 70% proximal left ICA stenoses. 4. Widely patent posterior circulation. Electronically Signed   By: Sebastian Ache M.D.   On: 08/10/2023 13:44    Procedures Procedures    Medications Ordered in ED Medications  iohexol (OMNIPAQUE) 350 MG/ML injection 75 mL (75 mLs Intravenous Contrast Given 08/10/23 1256)    ED Course/ Medical Decision Making/ A&P Clinical Course as of 08/10/23 1431  Sat Aug 10, 2023  1055 Labs within normal range, orthostatics negative. [VK]  1124 Patient asymptomatic at this time. Without alternative explanation of symptoms will have CTA Head/Neck to evaluate for CVA or worsening stenosis as cause of symptoms. [VK]  1413 Progressive atherosclerosis of the neck otherwise no acute abnormality on CTA. [VK]  1429 Patient remains asymptomatic. He is stable for discharge with outpatient follow up. [VK]    Clinical Course User Index [VK] Rexford Maus, DO                                 Medical Decision Making This patient presents to the ED with chief complaint(s) of dizziness, paresthesias with pertinent past medical history of HTN, TIA, carotid stenosis which further complicates the presenting complaint. The complaint involves an extensive differential diagnosis and also carries with it a high risk of complications and morbidity.    The differential diagnosis includes patient has bilateral symptoms without focal deficits and currently has an NIH of 0 making his CVA or TIA less likely, considering dehydration, electrolyte abnormality, anemia, arrhythmia  Additional history obtained: Additional history obtained from family Records reviewed  outpatient cardiology records  ED Course and Reassessment: On patient's arrival to the emergency department he is hemodynamically stable in no acute distress without focal neurologic deficits.  EKG on arrival showed normal sinus rhythm without acute ischemic changes.  The patient will have labs to evaluate for dehydration, anemia or electrolyte derangement as cause of his symptoms.  He will have orthostatic vitals to evaluate for cause of his dizziness and will be closely reassessed.  Independent labs interpretation:  The following labs were independently interpreted: within normal range  Independent visualization of imaging: - I independently visualized the following imaging with scope of interpretation  limited to determining acute life threatening conditions related to emergency care: CTA Head/neck, which revealed no acute abnormality  Consultation: - Consulted or discussed management/test interpretation w/ external professional: N/A  Consideration for admission or further workup: Patient has no emergent conditions requiring admission or further work-up at this time and is stable for discharge home with primary care follow-up  Social Determinants of health: N/A    Amount and/or Complexity of Data Reviewed Labs: ordered. Radiology: ordered.  Risk Prescription drug management.          Final Clinical Impression(s) / ED Diagnoses Final diagnoses:  Paresthesia  Dizziness    Rx / DC Orders ED Discharge Orders     None         Rexford Maus, DO 08/10/23 1431

## 2023-08-10 NOTE — ED Triage Notes (Signed)
Pt arrived POV from home c/o numbness and tingling in his arms that travels down into his legs and dizziness it started last week and went away. Pt states was fine yesterday woke up at 6am this morning and was fine but ate breakfast around 7am and shortly after he started having the symptoms.

## 2023-08-14 DIAGNOSIS — S0502XA Injury of conjunctiva and corneal abrasion without foreign body, left eye, initial encounter: Secondary | ICD-10-CM | POA: Diagnosis not present

## 2023-08-14 DIAGNOSIS — H5712 Ocular pain, left eye: Secondary | ICD-10-CM | POA: Diagnosis not present

## 2023-08-14 DIAGNOSIS — M542 Cervicalgia: Secondary | ICD-10-CM | POA: Diagnosis not present

## 2023-08-15 DIAGNOSIS — S0502XA Injury of conjunctiva and corneal abrasion without foreign body, left eye, initial encounter: Secondary | ICD-10-CM | POA: Diagnosis not present

## 2023-08-23 ENCOUNTER — Ambulatory Visit: Payer: Medicare Other | Admitting: Cardiology

## 2023-08-27 ENCOUNTER — Ambulatory Visit: Payer: Medicare Other

## 2023-08-27 DIAGNOSIS — I6521 Occlusion and stenosis of right carotid artery: Secondary | ICD-10-CM

## 2023-08-30 DIAGNOSIS — M542 Cervicalgia: Secondary | ICD-10-CM | POA: Diagnosis not present

## 2023-09-03 ENCOUNTER — Other Ambulatory Visit: Payer: Medicare Other

## 2023-09-04 DIAGNOSIS — M542 Cervicalgia: Secondary | ICD-10-CM | POA: Diagnosis not present

## 2023-10-03 ENCOUNTER — Encounter: Payer: Self-pay | Admitting: Cardiology

## 2023-10-03 ENCOUNTER — Ambulatory Visit: Payer: Medicare Other | Attending: Cardiology | Admitting: Cardiology

## 2023-10-03 VITALS — BP 140/58 | HR 76 | Ht 69.0 in | Wt 165.6 lb

## 2023-10-03 DIAGNOSIS — I6523 Occlusion and stenosis of bilateral carotid arteries: Secondary | ICD-10-CM

## 2023-10-03 DIAGNOSIS — E782 Mixed hyperlipidemia: Secondary | ICD-10-CM

## 2023-10-03 DIAGNOSIS — G459 Transient cerebral ischemic attack, unspecified: Secondary | ICD-10-CM

## 2023-10-03 DIAGNOSIS — I10 Essential (primary) hypertension: Secondary | ICD-10-CM

## 2023-10-03 MED ORDER — CLOPIDOGREL BISULFATE 75 MG PO TABS: 75.0000 mg | ORAL_TABLET | Freq: Every day | ORAL | 3 refills | Status: DC

## 2023-10-03 NOTE — Patient Instructions (Addendum)
Medication Instructions:  Your physician has recommended you make the following change in your medication:  1- START Plavix 75 mg by mouth daily.  *If you need a refill on your cardiac medications before your next appointment, please call your pharmacy*  Lab Work: If you have labs (blood work) drawn today and your tests are completely normal, you will receive your results only by: MyChart Message (if you have MyChart) OR A paper copy in the mail If you have any lab test that is abnormal or we need to change your treatment, we will call you to review the results.  Testing/Procedures: None ordered today.  Follow-Up: At [facility], you and your health needs are our priority.  As part of our continuing mission to provide you with exceptional heart care, we have created designated Provider Care Teams.  These Care Teams include your primary Cardiologist (physician) and Advanced Practice Providers (APPs -  Physician Assistants and Nurse Practitioners) who all work together to provide you with the care you need, when you need it.  We recommend signing up for the patient portal called "MyChart".  Sign up information is provided on this After Visit Summary.  MyChart is used to connect with patients for Virtual Visits (Telemedicine).  Patients are able to view lab/test results, encounter notes, upcoming appointments, etc.  Non-urgent messages can be sent to your provider as well.   To learn more about what you can do with MyChart, go to [email or web address].    Your next appointment:   3 month(s)  Provider:   Elder Negus, MD     You have been referred to Vascular Surgery. Other Instructions   Diet & Lifestyle recommendations:  Physical activity recommendation (The Physical Activity Guidelines for Americans. JAMA 2018;Nov 12) At least 150-300 minutes a week of moderate-intensity, or 75-150 minutes a week of vigorous-intensity aerobic physical activity, or an equivalent  combination of moderate- and vigorous-intensity aerobic activity. Adults should perform muscle-strengthening activities on 2 or more days a week. Older adults should do multicomponent physical activity that includes balance training as well as aerobic and muscle-strengthening activities. Benefits of increased physical activity include lower risk of mortality including cardiovascular mortality, lower risk of cardiovascular events and associated risk factors (hypertension and diabetes), and lower risk of many cancers (including bladder, breast, colon, endometrium, esophagus, kidney, lung, and stomach). Additional improvments have been seen in cognition, risk of dementia, anxiety and depression, improved bone health, lower risk of falls, and associated injuries.  Dietary recommendation The 2019 ACC/AHA guidelines promote nutrition as a main fixture of cardiovascular wellness, with a recommendation for a varied diet of fruit, vegetables, fish, legumes, and whole grains (Class I), as well as recommendations to reduce sodium, cholesterol, processed meats, and refined sugars (Class IIa recommendation).10 Sodium intake, a topic of some controversy as of late, is recommended to be kept at 1,500 mg/day or less, far below the average daily intake in the [place] of 3,409 mg/day, and notably below that of previous [place] recommendations for 300mg /day.10,11 For those unable to reach 1,500 mg/day, they recommend at least a reduction of 1000 mg/day.  A Pesco-Mediterranean Diet With Intermittent Fasting: JACC Review Topic of the Week. J Am Coll Cardiol 2020;76:1484-1493 Pesco-Mediterranean diet, it is supplemented with extra-virgin olive oil (EVOO), which is the principle fat source, along with moderate amounts of dairy (particularly yogurt and cheese) and eggs, as well as modest amounts of alcohol consumption (ideally red wine with the evening meal), but few red and processed meats.

## 2023-10-03 NOTE — Progress Notes (Signed)
Patient referred by Gwenlyn Found, MD for carotid artery stenosis.  Subjective:   Arthur Guzman, male    DOB: 07-07-1946, 77 y.o.   MRN: [id number]   Chief Complaint  Patient presents with   Carotid artery disease   HPI  77 year old Caucasian male with hypertension, controlled hyperlipidemia, h/o complex migraine/TIA, asymptomatic right carotid stenosis.  Patient was seen in emergency room on 07/2023 with complaints of numbness and dizziness.  Patient's symptoms are bilateral without focal deficits with NIH score of 0.  Therefore, stroke/TIA was thought to be less likely.  He underwent CTA head/neck that showed progressive carotid artery disease with 75% proximal right ICA, and 70% proximal left ICA stenoses.  These more or less corroborated with subsequent carotid duplex in 08/2023.  Subsequently, patient has had more episodes of numbness particularly on his left arm, that last for about a minute or so.  This seemed to occur every few days.  Blood pressure slightly elevated today, but reportedly lower than this at home.     Current Outpatient Medications:    aspirin EC 81 MG EC tablet, Take 1 tablet (81 mg total) by mouth daily., Disp:  , Rfl:    atorvastatin (LIPITOR) 40 MG tablet, Take 40 mg by mouth daily., Disp: , Rfl:    losartan (COZAAR) 25 MG tablet, Take 1 tablet (25 mg total) by mouth daily., Disp: 90 tablet, Rfl: 3    Cardiovascular studies:  Carotid artery duplex 08/27/2023:  Duplex suggests stenosis in the right internal carotid artery (>=70%).  The right PSV internal/common carotid artery ratio of 4.01 is consistent  with a stenosis of >70% with peak velocity of 329/96 cm/S.  Duplex suggests stenosis in the left internal carotid artery (50-69%).  Antegrade right vertebral artery flow. Left vertebral artery flow is not  visualized.  Compared to the study done on 03/11/2023, mild progression of disease on  the left from <50%.  Otherwise no significant  change. Follow up in six  months is appropriate if clinically indicated.   CTA head/neck 07/2023: 1. No evidence of acute intracranial abnormality. Mild-to-moderate chronic small vessel ischemic disease. 2. No large vessel occlusion. 3. Progressive atherosclerosis in the neck with 75% proximal right ICA and 70% proximal left ICA stenoses. 4. Widely patent posterior circulation.     Labs 07/2023: Unremarkable  03/15/2023: Chol 97, TG 66, HDL 47, LDL 35  12/13/2021: Glucose 90, BUN/Cr 12/0.96. EGFR 82. Na/K 142/4.4.     Review of Systems  Cardiovascular:  Negative for chest pain, dyspnea on exertion, leg swelling, palpitations and syncope.  Neurological:  Positive for numbness.         Vitals:   10/03/23 1050  BP: (!) 140/58  Pulse: 76  SpO2: 98%     Body mass index is 24.45 kg/m. Filed Weights   10/03/23 1050  Weight: 165 lb 9.6 oz (75.1 kg)      Objective:   Physical Exam Vitals and nursing note reviewed.  Constitutional:      General: He is not in acute distress.    Appearance: He is well-developed.  Neck:     Vascular: No JVD.  Cardiovascular:     Rate and Rhythm: Normal rate and regular rhythm.     Pulses: Intact distal pulses.          Carotid pulses are  on the right side with bruit and  on the left side with bruit.      Dorsalis pedis pulses are  1+ on the right side and 1+ on the left side.       Posterior tibial pulses are 0 on the right side and 0 on the left side.     Heart sounds: Normal heart sounds. No murmur heard. Pulmonary:     Effort: Pulmonary effort is normal.     Breath sounds: Normal breath sounds. No wheezing or rales.  Musculoskeletal:     Right lower leg: No edema.     Left lower leg: No edema.           Assessment & Recommendations:   77 year old Caucasian male with hypertension, controlled hyperlipidemia, h/o complex migraine/TIA, asymptomatic right carotid stenosis.  Mixed hyperlipidemia: Very well  controlled. Continue Lipitor 40 mg daily.  Carotid artery disease: 75% right ICA, 70% left ICA stenoses with mild progression compared to prior studies. ED visit in 07/2023 with bilateral numbness and dizziness, thought less likely to be due to stroke/TIA. However, some of his recent left arm numbness are concerning for possible TIA. Added Plavix 75 mg daily.  Continue aspirin, statin, lipids well-controlled. In addition, I will refer him to vascular surgery for symptomatic carotid stenosis. Given bilateral carotid artery stenoses, I would aim to be slightly less aggressive with hypertension control at this time, reasonable to aim for 140/80 mmHg.  Continue losartan 25 mg daily.  Hypertension: Continue losartan 25 mg daily.  F/u in 3 months  Signed, Elder Negus, MD

## 2023-11-20 DIAGNOSIS — L821 Other seborrheic keratosis: Secondary | ICD-10-CM | POA: Diagnosis not present

## 2023-11-20 DIAGNOSIS — Z85828 Personal history of other malignant neoplasm of skin: Secondary | ICD-10-CM | POA: Diagnosis not present

## 2023-11-20 DIAGNOSIS — H61002 Unspecified perichondritis of left external ear: Secondary | ICD-10-CM | POA: Diagnosis not present

## 2023-12-09 ENCOUNTER — Ambulatory Visit: Payer: Medicare Other | Admitting: Surgery

## 2023-12-09 ENCOUNTER — Encounter: Payer: Self-pay | Admitting: Surgery

## 2023-12-09 VITALS — BP 138/71 | HR 70 | Temp 98.0°F | Resp 20 | Ht 69.0 in | Wt 166.0 lb

## 2023-12-09 DIAGNOSIS — I6523 Occlusion and stenosis of bilateral carotid arteries: Secondary | ICD-10-CM | POA: Diagnosis not present

## 2023-12-09 NOTE — Progress Notes (Signed)
Vascular and [facility]in Specialist of [facility]  Patient name: Arthur Guzman MRN: [id number] DOB: Sep 11, 1946 Sex: male   REQUESTING PROVIDER:    Dr. Rosemary Holms   REASON FOR CONSULT:    carotid  HISTORY OF PRESENT ILLNESS:   Arthur Guzman is a 77 y.o. male, who is referred for evaluation of carotid stenosis.  He has been evaluated in the emergency department for numbness and dizziness.  His numbness has been in the left arm.  At the time of his trip to the hospital and August, his NIH score was 0.  He did have a CT scan that showed progressive bilateral carotid stenosis, right greater than left.  He has not had any new symptoms.  Patient does have coronary artery disease.  He is on a statin for hypercholesterolemia.  He is medically managed for hypertension.  He is a non-smoker.  PAST MEDICAL HISTORY    Past Medical History:  Diagnosis Date   Carotid artery occlusion    HTN (hypertension)    Hyperlipidemia    Migraine      FAMILY HISTORY   Family History  Problem Relation Age of Onset   Cancer Mother        type unknown   Heart disease Mother    Stroke Brother     SOCIAL HISTORY:   Social History   Socioeconomic History   Marital status: Married    Spouse name: Not on file   Number of children: 2   Years of education: Not on file   Highest education level: Not on file  Occupational History   Occupation: Curator  Tobacco Use   Smoking status: Never   Smokeless tobacco: Never  Vaping Use   Vaping status: Never Used  Substance and Sexual Activity   Alcohol use: No   Drug use: No   Sexual activity: Not on file  Other Topics Concern   Not on file  Social History Narrative   Not on file   Social Drivers of Health   Financial Resource Strain: Not on file  Food Insecurity: Not on file  Transportation Needs: Not on file  Physical Activity: Not on file  Stress: Not on file  Social Connections: Not on file   Intimate Partner Violence: Not on file    ALLERGIES:    Allergies  Allergen Reactions   Antihistamines, Chlorpheniramine-Type Other (See Comments)    Antihistamines make me fidgety.    CURRENT MEDICATIONS:    Current Outpatient Medications  Medication Sig Dispense Refill   aspirin EC 81 MG EC tablet Take 1 tablet (81 mg total) by mouth daily.     atorvastatin (LIPITOR) 40 MG tablet Take 40 mg by mouth daily.     clopidogrel (PLAVIX) 75 MG tablet Take 1 tablet (75 mg total) by mouth daily. 90 tablet 3   losartan (COZAAR) 25 MG tablet Take 1 tablet (25 mg total) by mouth daily. 90 tablet 3   No current facility-administered medications for this visit.    REVIEW OF SYSTEMS:   [X]  denotes positive finding, [ ]  denotes negative finding Cardiac  Comments:  Chest pain or chest pressure:    Shortness of breath upon exertion:    Short of breath when lying flat:    Irregular heart rhythm:        Vascular    Pain in calf, thigh, or hip brought on by ambulation:    Pain in feet at night that wakes you up from your sleep:  Blood clot in your veins:    Leg swelling:         Pulmonary    Oxygen at home:    Productive cough:     Wheezing:         Neurologic    Sudden weakness in arms or legs:     Sudden numbness in arms or legs:     Sudden onset of difficulty speaking or slurred speech:    Temporary loss of vision in one eye:     Problems with dizziness:         Gastrointestinal    Blood in stool:      Vomited blood:         Genitourinary    Burning when urinating:     Blood in urine:        Psychiatric    Major depression:         Hematologic    Bleeding problems:    Problems with blood clotting too easily:        Skin    Rashes or ulcers:        Constitutional    Fever or chills:     PHYSICAL EXAM:   Vitals:   12/09/23 0850 12/09/23 0852  BP: 126/68 138/71  Pulse: 70   Resp: 20   Temp: 98 F (36.7 C)   SpO2: 98%   Weight: 166 lb (75.3 kg)    Height: 5\' 9"  (1.753 m)     GENERAL: The patient is a well-nourished male, in no acute distress. The vital signs are documented above. CARDIAC: There is a regular rate and rhythm.  VASCULAR: Nonpalpable pedal pulses PULMONARY: Nonlabored respirations MUSCULOSKELETAL: There are no major deformities or cyanosis. NEUROLOGIC: No focal weakness or paresthesias are detected. SKIN: There are no ulcers or rashes noted. PSYCHIATRIC: The patient has a normal affect.  STUDIES:   I have reviewed his CT scan with the following findings: 1. No evidence of acute intracranial abnormality. Mild-to-moderate chronic small vessel ischemic disease. 2. No large vessel occlusion. 3. Progressive atherosclerosis in the neck with 75% proximal right ICA and 70% proximal left ICA stenoses. 4. Widely patent posterior circulation.  ASSESSMENT and PLAN   ?  Symptomatic right carotid stenosis: The patient has had progressive right sided carotid stenosis now in the 75-80% range.  He has been seen in the hospital for numbness in the left arm and dizziness.  This potentially could be related to his carotid.  Therefore, I have recommended proceeding with intervention.  We talked about stenting versus endarterectomy.  He prefers to have carotid endarterectomy.  I discussed the details of the operation including the risk of stroke and nerve injury.  All questions were answered.  This will be scheduled in the near future.  I will continue his aspirin and Plavix.   Charlena Cross, MD, FACS [facility] [phone] Pager [phone]

## 2023-12-13 ENCOUNTER — Other Ambulatory Visit: Payer: Self-pay

## 2023-12-13 DIAGNOSIS — I6523 Occlusion and stenosis of bilateral carotid arteries: Secondary | ICD-10-CM

## 2023-12-23 NOTE — Pre-Procedure Instructions (Signed)
 Surgical Instructions   Your procedure is scheduled on Friday, January 10th. Report to [facility] Main Entrance A at 05:30 A.M., then check in with the Admitting office. Any questions or running late day of surgery: call [phone]  Questions prior to your surgery date: call [phone], Monday-Friday, 8am-4pm. If you experience any cold or flu symptoms such as cough, fever, chills, shortness of breath, etc. between now and your scheduled surgery, please notify us  at the above number.     Remember:  Do not eat or drink after midnight the night before your surgery     Take these medicines the morning of surgery with A SIP OF WATER  aspirin  EC  atorvastatin  (LIPITOR)  clopidogrel  (PLAVIX )    One week prior to surgery, STOP taking any Aspirin  (unless otherwise instructed by your surgeon) Aleve, Naproxen, Ibuprofen, Motrin, Advil, Goody's, BC's, all herbal medications, fish oil, and non-prescription vitamins.                     Do NOT Smoke (Tobacco/Vaping) for 24 hours prior to your procedure.  If you use a CPAP at night, you may bring your mask/headgear for your overnight stay.   You will be asked to remove any contacts, glasses, piercing's, hearing aid's, dentures/partials prior to surgery. Please bring cases for these items if needed.    Patients discharged the day of surgery will not be allowed to drive home, and someone needs to stay with them for 24 hours.  SURGICAL WAITING ROOM VISITATION Patients may have no more than 2 support people in the waiting area - these visitors may rotate.   Pre-op nurse will coordinate an appropriate time for 1 ADULT support person, who may not rotate, to accompany patient in pre-op.  Children under the age of 36 must have an adult with them who is not the patient and must remain in the main waiting area with an adult.  If the patient needs to stay at the hospital during part of their recovery, the visitor guidelines for inpatient rooms  apply.  Please refer to the [facility] website for the visitor guidelines for any additional information.   If you received a COVID test during your pre-op visit  it is requested that you wear a mask when out in public, stay away from anyone that may not be feeling well and notify your surgeon if you develop symptoms. If you have been in contact with anyone that has tested positive in the last 10 days please notify you surgeon.      Pre-operative CHG Bathing Instructions   You can play a key role in reducing the risk of infection after surgery. Your skin needs to be as free of germs as possible. You can reduce the number of germs on your skin by washing with CHG (chlorhexidine  gluconate) soap before surgery. CHG is an antiseptic soap that kills germs and continues to kill germs even after washing.   DO NOT use if you have an allergy to chlorhexidine /CHG or antibacterial soaps. If your skin becomes reddened or irritated, stop using the CHG and notify one of our RNs at [phone].              TAKE A SHOWER THE NIGHT BEFORE SURGERY AND THE DAY OF SURGERY    Please keep in mind the following:  DO NOT shave, including legs and underarms, 48 hours prior to surgery.   You may shave your face before/day of surgery.  Place clean sheets  on your bed the night before surgery Use a clean washcloth (not used since being washed) for each shower. DO NOT sleep with pet's night before surgery.  CHG Shower Instructions:  Wash your face and private area with normal soap. If you choose to wash your hair, wash first with your normal shampoo.  After you use shampoo/soap, rinse your hair and body thoroughly to remove shampoo/soap residue.  Turn the water OFF and apply half the bottle of CHG soap to a CLEAN washcloth.  Apply CHG soap ONLY FROM YOUR NECK DOWN TO YOUR TOES (washing for 3-5 minutes)  DO NOT use CHG soap on face, private areas, open wounds, or sores.  Pay special attention to the area where  your surgery is being performed.  If you are having back surgery, having someone wash your back for you may be helpful. Wait 2 minutes after CHG soap is applied, then you may rinse off the CHG soap.  Pat dry with a clean towel  Put on clean pajamas    Additional instructions for the day of surgery: DO NOT APPLY any lotions, deodorants, cologne, or perfumes.   Do not wear jewelry or makeup Do not wear nail polish, gel polish, artificial nails, or any other type of covering on natural nails (fingers and toes) Do not bring valuables to the hospital. [facility] is not responsible for valuables/personal belongings. Put on clean/comfortable clothes.  Please brush your teeth.  Ask your nurse before applying any prescription medications to the skin.

## 2023-12-24 ENCOUNTER — Encounter (HOSPITAL_COMMUNITY): Payer: Self-pay

## 2023-12-24 ENCOUNTER — Encounter (HOSPITAL_COMMUNITY)
Admission: RE | Admit: 2023-12-24 | Discharge: 2023-12-24 | Disposition: A | Discharge status: Home / Self Care | Payer: Medicare Other | Source: Ambulatory Visit | Attending: Surgery | Admitting: Surgery

## 2023-12-24 ENCOUNTER — Other Ambulatory Visit: Payer: Self-pay

## 2023-12-24 VITALS — BP 133/64 | HR 68 | Temp 97.6°F | Resp 20 | Ht 69.0 in | Wt 168.3 lb

## 2023-12-24 DIAGNOSIS — Z9049 Acquired absence of other specified parts of digestive tract: Secondary | ICD-10-CM | POA: Insufficient documentation

## 2023-12-24 DIAGNOSIS — E785 Hyperlipidemia, unspecified: Secondary | ICD-10-CM | POA: Insufficient documentation

## 2023-12-24 DIAGNOSIS — Z7902 Long term (current) use of antithrombotics/antiplatelets: Secondary | ICD-10-CM | POA: Diagnosis not present

## 2023-12-24 DIAGNOSIS — Z01812 Encounter for preprocedural laboratory examination: Secondary | ICD-10-CM | POA: Insufficient documentation

## 2023-12-24 DIAGNOSIS — E7800 Pure hypercholesterolemia, unspecified: Secondary | ICD-10-CM | POA: Diagnosis not present

## 2023-12-24 DIAGNOSIS — E782 Mixed hyperlipidemia: Secondary | ICD-10-CM | POA: Diagnosis not present

## 2023-12-24 DIAGNOSIS — Z823 Family history of stroke: Secondary | ICD-10-CM | POA: Diagnosis not present

## 2023-12-24 DIAGNOSIS — Z9079 Acquired absence of other genital organ(s): Secondary | ICD-10-CM | POA: Insufficient documentation

## 2023-12-24 DIAGNOSIS — Z79899 Other long term (current) drug therapy: Secondary | ICD-10-CM | POA: Diagnosis not present

## 2023-12-24 DIAGNOSIS — I6521 Occlusion and stenosis of right carotid artery: Secondary | ICD-10-CM | POA: Diagnosis not present

## 2023-12-24 DIAGNOSIS — Z888 Allergy status to other drugs, medicaments and biological substances status: Secondary | ICD-10-CM | POA: Diagnosis not present

## 2023-12-24 DIAGNOSIS — Z8249 Family history of ischemic heart disease and other diseases of the circulatory system: Secondary | ICD-10-CM | POA: Diagnosis not present

## 2023-12-24 DIAGNOSIS — Z7982 Long term (current) use of aspirin: Secondary | ICD-10-CM | POA: Insufficient documentation

## 2023-12-24 DIAGNOSIS — I10 Essential (primary) hypertension: Secondary | ICD-10-CM | POA: Diagnosis not present

## 2023-12-24 DIAGNOSIS — K080 Exfoliation of teeth due to systemic causes: Secondary | ICD-10-CM | POA: Diagnosis not present

## 2023-12-24 DIAGNOSIS — I2510 Atherosclerotic heart disease of native coronary artery without angina pectoris: Secondary | ICD-10-CM | POA: Diagnosis not present

## 2023-12-24 DIAGNOSIS — Z01818 Encounter for other preprocedural examination: Secondary | ICD-10-CM

## 2023-12-24 DIAGNOSIS — Z8673 Personal history of transient ischemic attack (TIA), and cerebral infarction without residual deficits: Secondary | ICD-10-CM | POA: Insufficient documentation

## 2023-12-24 DIAGNOSIS — I6523 Occlusion and stenosis of bilateral carotid arteries: Secondary | ICD-10-CM | POA: Insufficient documentation

## 2023-12-24 HISTORY — DX: Cerebral infarction, unspecified: I63.9

## 2023-12-24 LAB — COMPREHENSIVE METABOLIC PANEL
ALT: 21 U/L (ref 0–44)
AST: 24 U/L (ref 15–41)
Albumin: 3.5 g/dL (ref 3.5–5.0)
Alkaline Phosphatase: 106 U/L (ref 38–126)
Anion gap: 6 (ref 5–15)
BUN: 14 mg/dL (ref 8–23)
CO2: 28 mmol/L (ref 22–32)
Calcium: 9.4 mg/dL (ref 8.9–10.3)
Chloride: 105 mmol/L (ref 98–111)
Creatinine, Ser: 0.92 mg/dL (ref 0.61–1.24)
GFR, Estimated: >60 mL/min (ref >60)
Glucose, Bld: 113 mg/dL — ABNORMAL HIGH (ref 70–99)
Potassium: 4.3 mmol/L (ref 3.5–5.1)
Sodium: 139 mmol/L (ref 135–145)
Total Bilirubin: 0.5 mg/dL (ref 0.0–1.2)
Total Protein: 6.8 g/dL (ref 6.5–8.1)

## 2023-12-24 LAB — CBC
HCT: 40.3 % (ref 39.0–52.0)
Hemoglobin: 13.8 g/dL (ref 13.0–17.0)
MCH: 31.9 pg (ref 26.0–34.0)
MCHC: 34.2 g/dL (ref 30.0–36.0)
MCV: 93.1 fL (ref 80.0–100.0)
Platelets: 188 10*3/uL (ref 150–400)
RBC: 4.33 MIL/uL (ref 4.22–5.81)
RDW: 13.1 % (ref 11.5–15.5)
WBC: 6.7 10*3/uL (ref 4.0–10.5)
nRBC: 0 % (ref 0.0–0.2)

## 2023-12-24 LAB — TYPE AND SCREEN
ABO/RH(D): O POS
Antibody Screen: NEGATIVE

## 2023-12-24 LAB — URINALYSIS, ROUTINE W REFLEX MICROSCOPIC
Bilirubin Urine: NEGATIVE
Glucose, UA: NEGATIVE mg/dL
Hgb urine dipstick: NEGATIVE
Ketones, ur: NEGATIVE mg/dL
Leukocytes,Ua: NEGATIVE
Nitrite: NEGATIVE
Protein, ur: NEGATIVE mg/dL
Specific Gravity, Urine: 1.011 (ref 1.005–1.030)
pH: 6 (ref 5.0–8.0)

## 2023-12-24 LAB — SURGICAL PCR SCREEN
MRSA, PCR: NEGATIVE
Staphylococcus aureus: NEGATIVE

## 2023-12-24 LAB — PROTIME-INR
INR: 1.1 (ref 0.8–1.2)
Prothrombin Time: 14.2 s (ref 11.4–15.2)

## 2023-12-24 LAB — APTT: aPTT: 34 s (ref 24–36)

## 2023-12-24 NOTE — Progress Notes (Signed)
 PCP - Leila Lukes, MD Cardiologist - Elmira Penman, MD  PPM/ICD - denies Device Orders - n/a Rep Notified - n/a  Chest x-ray - denies EKG - 08/06/2023 Stress Test - denies ECHO - denies Cardiac Cath - denies  Sleep Study - denies CPAP - n/a  Fasting Blood Sugar - no DM Checks Blood Sugar _____ times a day  Last dose of GLP1 agonist-  n/a GLP1 instructions: n/a  Blood Thinner Instructions: no hold Aspirin  Instructions:no hold  ERAS Protcol - no; NPO after midnight PRE-SURGERY Ensure or G2- n/a  COVID TEST- n/a  Anesthesia review: yes (echocardiogram on 2020)  Patient denies shortness of breath, fever, cough and chest pain at PAT appointment   All instructions explained to the patient, with a verbal understanding of the material. Patient agrees to go over the instructions while at home for a better understanding. Patient also instructed to self quarantine after being tested for COVID-19. The opportunity to ask questions was provided.

## 2023-12-25 NOTE — Progress Notes (Signed)
 Anesthesia Chart Review:  Case: [id number] Date/Time: 12/27/23 0715   Procedure: RIGHT CAROTID ENDARTERECTOMY (Right)   Anesthesia type: General   Pre-op diagnosis: Carotid stenosis, bilateral   [facility]: [facility] ROOM 11 / [facility] OR   Surgeons: Serene Gaile ORN, MD       DISCUSSION: Patient is a 78 year old male scheduled for the above procedure. He was referred by his cardiologist Dr. Elmira for progressive right sided carotid stenosis now in the 75-80% range. He had been evaluated in the ED for LUE numbness and dizziness, possibly with symptomatic RICA stenosis. Right carotid intervention recommended.   History includes never smoker, carotid artery stenosis, TIA (08/2019), HLD, cholecystectomy (12/07/20), left orchiectomy, spinal surgery.  Continue ASA and Plavix  per [facility]VS.  Anesthesia team to evaluate on the day of surgery.  VS: BP 133/64   Pulse 68   Temp 36.4 C (Oral)   Resp 20   Ht 5' 9 (1.753 m)   Wt 76.3 kg   SpO2 100%   BMI 24.85 kg/m   PROVIDERS: [name], MD is PCP  Elmira Penman, MD is cardiologist    LABS: Labs reviewed: Acceptable for surgery. (all labs ordered are listed, but only abnormal results are displayed)  Labs Reviewed  COMPREHENSIVE METABOLIC PANEL - Abnormal; Notable for the following components:      Result Value   Glucose, Bld 113 (*)    All other components within normal limits  SURGICAL PCR SCREEN  CBC  PROTIME-INR  APTT  URINALYSIS, ROUTINE W REFLEX MICROSCOPIC  TYPE AND SCREEN     IMAGES: CT Head & CTA Head/Neck 08/10/23: IMPRESSION: 1. No evidence of acute intracranial abnormality. Mild-to-moderate chronic small vessel ischemic disease. 2. No large vessel occlusion. 3. Progressive atherosclerosis in the neck with 75% proximal right ICA and 70% proximal left ICA stenoses. 4. Widely patent posterior circulation.      EKG: 08/10/23: Sinus rhythm Left anterior fascicular block No significant change since last  tracing Confirmed by Ellouise Fine ([id number]) on 08/10/2023 9:49:00 AM   CV: Carotid artery duplex 08/27/2023:  Duplex suggests stenosis in the right internal carotid artery (>=70%).  The right PSV internal/common carotid artery ratio of 4.01 is consistent  with a stenosis of >70% with peak velocity of 329/96 cm/S.  Duplex suggests stenosis in the left internal carotid artery (50-69%).  Antegrade right vertebral artery flow. Left vertebral artery flow is not  visualized.  Compared to the study done on 03/11/2023, mild progression of disease on  the left from <50%.  Otherwise no significant change. Follow up in six  months is appropriate if clinically indicated.    ABI 04/06/2021:  This exam reveals normal perfusion of the right and left lower extremity  (Rt. ABI 0.97 and Lt. ABI 1.00). NOrmal triphasic waveform at bilateral  ankles. Left DP waveform diminished.    Echocardiogram 08/26/2019: IMPRESSIONS   1. The left ventricle has normal systolic function with an ejection  fraction of 60-65%. The cavity size was normal. There is moderately  increased left ventricular wall thickness. Left ventricular diastolic  Doppler parameters are consistent with impaired   relaxation. Indeterminate filling pressures The E/e' is 8-15. No evidence  of left ventricular regional wall motion abnormalities.   2. The right ventricle has normal systolic function. The cavity was  normal. There is no increase in right ventricular wall thickness.   3. The mitral valve is grossly normal.   4. The tricuspid valve is grossly normal.   5. The aortic  valve is tricuspid. No stenosis of the aortic valve.   6. The aorta is normal unless otherwise noted.     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    Past Surgical History:  Procedure Laterality Date   CATARACT EXTRACTION Right 2017    CHOLECYSTECTOMY N/A 12/07/2020   Procedure: LAPAROSCOPIC CHOLECYSTECTOMY WITH INTRAOPERATIVE CHOLANGIOGRAM;  Surgeon: Eletha Boas, MD;  Location: [facility] ORS;  Service: General;  Laterality: N/A;   EYE SURGERY Bilateral    implants   LUMBAR DISC SURGERY     1995   PATELLA FRACTURE SURGERY Left    TESTICLE REMOVAL Left     MEDICATIONS:  aspirin  EC 81 MG EC tablet   atorvastatin  (LIPITOR) 40 MG tablet   clopidogrel  (PLAVIX ) 75 MG tablet   losartan  (COZAAR ) 25 MG tablet   No current facility-administered medications for this encounter.    Isaiah Ruder, PA-C Surgical Short Stay/Anesthesiology [facility] Phone [phone] [facility] Phone [phone] 12/25/2023 1:12 PM

## 2023-12-25 NOTE — Anesthesia Preprocedure Evaluation (Addendum)
 Anesthesia Evaluation  Patient identified by MRN, date of birth, ID band Patient awake    Reviewed: Allergy & Precautions, H&P , NPO status , Patient's Chart, lab work & pertinent test results  Airway Mallampati: II  TM Distance: >3 FB Neck ROM: Full    Dental no notable dental hx.    Pulmonary neg pulmonary ROS   Pulmonary exam normal breath sounds clear to auscultation       Cardiovascular hypertension, Normal cardiovascular exam Rhythm:Regular Rate:Normal  Carotid stenosis   Neuro/Psych  Headaches TIACVA  negative psych ROS   GI/Hepatic negative GI ROS, Neg liver ROS,,,  Endo/Other  negative endocrine ROS    Renal/GU negative Renal ROS  negative genitourinary   Musculoskeletal negative musculoskeletal ROS (+)    Abdominal   Peds negative pediatric ROS (+)  Hematology negative hematology ROS (+)   Anesthesia Other Findings   Reproductive/Obstetrics negative OB ROS                             Anesthesia Physical Anesthesia Plan  ASA: 3  Anesthesia Plan: General   Post-op Pain Management: Tylenol  PO (pre-op)*   Induction: Intravenous  PONV Risk Score and Plan: 3 and Ondansetron , Dexamethasone  and Treatment may vary due to age or medical condition  Airway Management Planned: Oral ETT  Additional Equipment: Arterial line  Intra-op Plan:   Post-operative Plan: Extubation in OR  Informed Consent: I have reviewed the patients History and Physical, chart, labs and discussed the procedure including the risks, benefits and alternatives for the proposed anesthesia with the patient or authorized representative who has indicated his/her understanding and acceptance.     Dental advisory given  Plan Discussed with: CRNA  Anesthesia Plan Comments: (PAT note written 12/25/2023 by Allison Zelenak, PA-C.  Patient is a 78 year old male scheduled for the above procedure. He was referred by  his cardiologist Dr. Elmira for progressive right sided carotid stenosis now in the 75-80% range. He had been evaluated in the ED for LUE numbness and dizziness, possibly with symptomatic RICA stenosis. Right carotid intervention recommended.    History includes never smoker, carotid artery stenosis, TIA (08/2019), HLD, cholecystectomy (12/07/20), left orchiectomy, spinal surgery.  )       Anesthesia Quick Evaluation

## 2023-12-27 ENCOUNTER — Other Ambulatory Visit: Payer: Self-pay

## 2023-12-27 ENCOUNTER — Encounter (HOSPITAL_COMMUNITY)
Admission: RE | Disposition: A | Discharge status: Home / Self Care | Payer: Self-pay | Source: Ambulatory Visit | Attending: Surgery

## 2023-12-27 ENCOUNTER — Inpatient Hospital Stay (HOSPITAL_COMMUNITY): Payer: Medicare Other | Admitting: Certified Registered Nurse Anesthetist

## 2023-12-27 ENCOUNTER — Inpatient Hospital Stay (HOSPITAL_COMMUNITY): Payer: Medicare Other | Admitting: Vascular Surgery

## 2023-12-27 ENCOUNTER — Inpatient Hospital Stay (HOSPITAL_COMMUNITY)
Admission: RE | Admit: 2023-12-27 | Discharge: 2023-12-28 | DRG: 039 | Disposition: A | Discharge status: Home / Self Care | Payer: Medicare Other | Attending: Surgery | Admitting: Surgery

## 2023-12-27 ENCOUNTER — Encounter (HOSPITAL_COMMUNITY): Payer: Self-pay | Admitting: Surgery

## 2023-12-27 DIAGNOSIS — Z7982 Long term (current) use of aspirin: Secondary | ICD-10-CM

## 2023-12-27 DIAGNOSIS — I2510 Atherosclerotic heart disease of native coronary artery without angina pectoris: Secondary | ICD-10-CM | POA: Diagnosis present

## 2023-12-27 DIAGNOSIS — Z888 Allergy status to other drugs, medicaments and biological substances status: Secondary | ICD-10-CM | POA: Diagnosis not present

## 2023-12-27 DIAGNOSIS — Z79899 Other long term (current) drug therapy: Secondary | ICD-10-CM | POA: Diagnosis not present

## 2023-12-27 DIAGNOSIS — E7800 Pure hypercholesterolemia, unspecified: Secondary | ICD-10-CM | POA: Diagnosis present

## 2023-12-27 DIAGNOSIS — I6521 Occlusion and stenosis of right carotid artery: Secondary | ICD-10-CM | POA: Diagnosis not present

## 2023-12-27 DIAGNOSIS — E782 Mixed hyperlipidemia: Secondary | ICD-10-CM | POA: Diagnosis not present

## 2023-12-27 DIAGNOSIS — Z7902 Long term (current) use of antithrombotics/antiplatelets: Secondary | ICD-10-CM

## 2023-12-27 DIAGNOSIS — I10 Essential (primary) hypertension: Secondary | ICD-10-CM | POA: Diagnosis present

## 2023-12-27 DIAGNOSIS — Z823 Family history of stroke: Secondary | ICD-10-CM

## 2023-12-27 DIAGNOSIS — Z8673 Personal history of transient ischemic attack (TIA), and cerebral infarction without residual deficits: Secondary | ICD-10-CM | POA: Diagnosis not present

## 2023-12-27 DIAGNOSIS — Z8249 Family history of ischemic heart disease and other diseases of the circulatory system: Secondary | ICD-10-CM | POA: Diagnosis not present

## 2023-12-27 DIAGNOSIS — I6523 Occlusion and stenosis of bilateral carotid arteries: Secondary | ICD-10-CM | POA: Diagnosis present

## 2023-12-27 DIAGNOSIS — I6529 Occlusion and stenosis of unspecified carotid artery: Principal | ICD-10-CM | POA: Diagnosis present

## 2023-12-27 HISTORY — PX: PATCH ANGIOPLASTY: SHX6230

## 2023-12-27 HISTORY — PX: ENDARTERECTOMY: SHX5162

## 2023-12-27 LAB — CBC
HCT: 35.7 % — ABNORMAL LOW (ref 39.0–52.0)
Hemoglobin: 12.4 g/dL — ABNORMAL LOW (ref 13.0–17.0)
MCH: 31.8 pg (ref 26.0–34.0)
MCHC: 34.7 g/dL (ref 30.0–36.0)
MCV: 91.5 fL (ref 80.0–100.0)
Platelets: 176 10*3/uL (ref 150–400)
RBC: 3.9 MIL/uL — ABNORMAL LOW (ref 4.22–5.81)
RDW: 13.2 % (ref 11.5–15.5)
WBC: 12.1 10*3/uL — ABNORMAL HIGH (ref 4.0–10.5)
nRBC: 0 % (ref 0.0–0.2)

## 2023-12-27 LAB — CREATININE, SERUM
Creatinine, Ser: 0.94 mg/dL (ref 0.61–1.24)
GFR, Estimated: >60 mL/min (ref >60)

## 2023-12-27 LAB — POCT ACTIVATED CLOTTING TIME
Activated Clotting Time: 216 s
Activated Clotting Time: 250 s

## 2023-12-27 LAB — ABO/RH: ABO/RH(D): O POS

## 2023-12-27 SURGERY — ENDARTERECTOMY, CAROTID
Anesthesia: General | Site: Neck | Laterality: Right

## 2023-12-27 MED ORDER — DOCUSATE SODIUM 100 MG PO CAPS
100.0000 mg | ORAL_CAPSULE | Freq: Every day | ORAL | Status: DC
  Administered 2023-12-28: 100 mg via ORAL
  Filled 2023-12-27: qty 1

## 2023-12-27 MED ORDER — DEXAMETHASONE SODIUM PHOSPHATE 10 MG/ML IJ SOLN
INTRAMUSCULAR | Status: DC | PRN
  Administered 2023-12-27: 10 mg via INTRAVENOUS

## 2023-12-27 MED ORDER — LIDOCAINE 2% (20 MG/ML) 5 ML SYRINGE
INTRAMUSCULAR | Status: AC
  Filled 2023-12-27: qty 5

## 2023-12-27 MED ORDER — GUAIFENESIN-DM 100-10 MG/5ML PO SYRP: 15.0000 mL | ORAL_SOLUTION | ORAL | Status: DC | PRN

## 2023-12-27 MED ORDER — ACETAMINOPHEN 10 MG/ML IV SOLN: 1000.0000 mg | Freq: Once | INTRAVENOUS | Status: DC | PRN

## 2023-12-27 MED ORDER — PROTAMINE SULFATE 10 MG/ML IV SOLN
INTRAVENOUS | Status: DC | PRN
  Administered 2023-12-27 (×2): 10 mg via INTRAVENOUS
  Administered 2023-12-27 (×2): 5 mg via INTRAVENOUS
  Administered 2023-12-27 (×2): 10 mg via INTRAVENOUS

## 2023-12-27 MED ORDER — CEFAZOLIN SODIUM-DEXTROSE 2-4 GM/100ML-% IV SOLN
2.0000 g | INTRAVENOUS | Status: AC
  Administered 2023-12-27: 2 g via INTRAVENOUS
  Filled 2023-12-27: qty 100

## 2023-12-27 MED ORDER — LACTATED RINGERS IV SOLN: INTRAVENOUS | Status: DC | PRN

## 2023-12-27 MED ORDER — HYDROMORPHONE HCL 1 MG/ML IJ SOLN
0.5000 mg | INTRAMUSCULAR | Status: DC | PRN
  Administered 2023-12-27 (×2): 0.5 mg via INTRAVENOUS
  Filled 2023-12-27 (×2): qty 0.5

## 2023-12-27 MED ORDER — SODIUM CHLORIDE 0.9 % IV SOLN: INTRAVENOUS | Status: DC

## 2023-12-27 MED ORDER — PHENYLEPHRINE HCL-NACL 20-0.9 MG/250ML-% IV SOLN
INTRAVENOUS | Status: DC | PRN
  Administered 2023-12-27: 60 ug/min via INTRAVENOUS

## 2023-12-27 MED ORDER — CHLORHEXIDINE GLUCONATE CLOTH 2 % EX PADS: 6.0000 | MEDICATED_PAD | Freq: Once | CUTANEOUS | Status: DC

## 2023-12-27 MED ORDER — ALUM & MAG HYDROXIDE-SIMETH 200-200-20 MG/5ML PO SUSP: 15.0000 mL | ORAL | Status: DC | PRN

## 2023-12-27 MED ORDER — DROPERIDOL 2.5 MG/ML IJ SOLN: 0.6250 mg | Freq: Once | INTRAMUSCULAR | Status: DC | PRN

## 2023-12-27 MED ORDER — ACETAMINOPHEN 325 MG PO TABS
325.0000 mg | ORAL_TABLET | ORAL | Status: DC | PRN
Start: 2023-12-27 — End: 2023-12-28
  Filled 2023-12-27: qty 2

## 2023-12-27 MED ORDER — OXYCODONE HCL 5 MG PO TABS: 5.0000 mg | ORAL_TABLET | Freq: Once | ORAL | Status: DC | PRN

## 2023-12-27 MED ORDER — ACETAMINOPHEN 650 MG RE SUPP: 325.0000 mg | RECTAL | Status: DC | PRN

## 2023-12-27 MED ORDER — LOSARTAN POTASSIUM 25 MG PO TABS
25.0000 mg | ORAL_TABLET | Freq: Every day | ORAL | Status: DC
  Administered 2023-12-27 – 2023-12-28 (×2): 25 mg via ORAL
  Filled 2023-12-27 (×3): qty 1

## 2023-12-27 MED ORDER — OXYCODONE HCL 5 MG PO TABS: 5.0000 mg | ORAL_TABLET | ORAL | Status: DC | PRN

## 2023-12-27 MED ORDER — ASPIRIN 81 MG PO TBEC: 81.0000 mg | DELAYED_RELEASE_TABLET | Freq: Every day | ORAL | Status: DC

## 2023-12-27 MED ORDER — FENTANYL CITRATE (PF) 250 MCG/5ML IJ SOLN
INTRAMUSCULAR | Status: DC | PRN
  Administered 2023-12-27: 50 ug via INTRAVENOUS

## 2023-12-27 MED ORDER — SODIUM CHLORIDE 0.9 % IV SOLN
0.1500 ug/kg/min | INTRAVENOUS | Status: DC
  Administered 2023-12-27: .1 ug/kg/min via INTRAVENOUS
  Filled 2023-12-27: qty 2000

## 2023-12-27 MED ORDER — PROPOFOL 10 MG/ML IV BOLUS
INTRAVENOUS | Status: DC | PRN
  Administered 2023-12-27: 110 mg via INTRAVENOUS

## 2023-12-27 MED ORDER — METOPROLOL TARTRATE 5 MG/5ML IV SOLN: 2.0000 mg | INTRAVENOUS | Status: DC | PRN

## 2023-12-27 MED ORDER — LIDOCAINE HCL 1 % IJ SOLN
INTRAMUSCULAR | Status: AC
Start: 2023-12-27 — End: ?
  Filled 2023-12-27: qty 20

## 2023-12-27 MED ORDER — HEPARIN SODIUM (PORCINE) 5000 UNIT/ML IJ SOLN
5000.0000 [IU] | Freq: Three times a day (TID) | INTRAMUSCULAR | Status: DC
  Administered 2023-12-28: 5000 [IU] via SUBCUTANEOUS
  Filled 2023-12-27: qty 1

## 2023-12-27 MED ORDER — EPHEDRINE SULFATE-NACL 50-0.9 MG/10ML-% IV SOSY
PREFILLED_SYRINGE | INTRAVENOUS | Status: DC | PRN
  Administered 2023-12-27: 10 mg via INTRAVENOUS

## 2023-12-27 MED ORDER — FENTANYL CITRATE (PF) 100 MCG/2ML IJ SOLN
INTRAMUSCULAR | Status: AC
  Filled 2023-12-27: qty 2

## 2023-12-27 MED ORDER — OXYCODONE HCL 5 MG/5ML PO SOLN: 5.0000 mg | Freq: Once | ORAL | Status: DC | PRN

## 2023-12-27 MED ORDER — BISACODYL 5 MG PO TBEC
5.0000 mg | DELAYED_RELEASE_TABLET | Freq: Every day | ORAL | Status: DC | PRN
  Filled 2023-12-27: qty 1

## 2023-12-27 MED ORDER — MAGNESIUM SULFATE 2 GM/50ML IV SOLN: 2.0000 g | Freq: Every day | INTRAVENOUS | Status: DC | PRN

## 2023-12-27 MED ORDER — SUGAMMADEX SODIUM 200 MG/2ML IV SOLN
INTRAVENOUS | Status: DC | PRN
  Administered 2023-12-27: 200 mg via INTRAVENOUS

## 2023-12-27 MED ORDER — LABETALOL HCL 5 MG/ML IV SOLN: 10.0000 mg | INTRAVENOUS | Status: DC | PRN

## 2023-12-27 MED ORDER — PROPOFOL 10 MG/ML IV BOLUS
INTRAVENOUS | Status: AC
  Filled 2023-12-27: qty 20

## 2023-12-27 MED ORDER — ROCURONIUM BROMIDE 10 MG/ML (PF) SYRINGE
PREFILLED_SYRINGE | INTRAVENOUS | Status: AC
  Filled 2023-12-27: qty 10

## 2023-12-27 MED ORDER — POTASSIUM CHLORIDE CRYS ER 20 MEQ PO TBCR: 20.0000 meq | EXTENDED_RELEASE_TABLET | Freq: Every day | ORAL | Status: DC | PRN

## 2023-12-27 MED ORDER — CEFAZOLIN SODIUM-DEXTROSE 2-4 GM/100ML-% IV SOLN
2.0000 g | Freq: Three times a day (TID) | INTRAVENOUS | Status: AC
  Administered 2023-12-27 – 2023-12-28 (×2): 2 g via INTRAVENOUS
  Filled 2023-12-27 (×2): qty 100

## 2023-12-27 MED ORDER — HEPARIN SODIUM (PORCINE) 1000 UNIT/ML IJ SOLN
INTRAMUSCULAR | Status: DC | PRN
  Administered 2023-12-27: 8000 [IU] via INTRAVENOUS
  Administered 2023-12-27: 2000 [IU] via INTRAVENOUS

## 2023-12-27 MED ORDER — LIDOCAINE 2% (20 MG/ML) 5 ML SYRINGE
INTRAMUSCULAR | Status: DC | PRN
  Administered 2023-12-27: 60 mg via INTRAVENOUS

## 2023-12-27 MED ORDER — HEPARIN 6000 UNIT IRRIGATION SOLUTION
Status: AC
  Filled 2023-12-27: qty 500

## 2023-12-27 MED ORDER — FENTANYL CITRATE (PF) 100 MCG/2ML IJ SOLN
25.0000 ug | INTRAMUSCULAR | Status: DC | PRN
  Administered 2023-12-27: 50 ug via INTRAVENOUS

## 2023-12-27 MED ORDER — SODIUM CHLORIDE 0.9% FLUSH: 3.0000 mL | Freq: Two times a day (BID) | INTRAVENOUS | Status: DC

## 2023-12-27 MED ORDER — SODIUM CHLORIDE 0.9 % IV SOLN: 500.0000 mL | Freq: Once | INTRAVENOUS | Status: DC | PRN

## 2023-12-27 MED ORDER — FENTANYL CITRATE (PF) 250 MCG/5ML IJ SOLN
INTRAMUSCULAR | Status: AC
  Filled 2023-12-27: qty 5

## 2023-12-27 MED ORDER — HYDRALAZINE HCL 20 MG/ML IJ SOLN: 5.0000 mg | INTRAMUSCULAR | Status: DC | PRN

## 2023-12-27 MED ORDER — SODIUM CHLORIDE 0.9% FLUSH: 3.0000 mL | INTRAVENOUS | Status: DC | PRN

## 2023-12-27 MED ORDER — PHENOL 1.4 % MT LIQD: 1.0000 | OROMUCOSAL | Status: DC | PRN

## 2023-12-27 MED ORDER — ASPIRIN 81 MG PO TBEC
81.0000 mg | DELAYED_RELEASE_TABLET | Freq: Every day | ORAL | Status: DC
  Administered 2023-12-28: 81 mg via ORAL
  Filled 2023-12-27: qty 1

## 2023-12-27 MED ORDER — SURGIFLO WITH THROMBIN (HEMOSTATIC MATRIX KIT) OPTIME
TOPICAL | Status: DC | PRN
  Administered 2023-12-27: 1 via TOPICAL

## 2023-12-27 MED ORDER — 0.9 % SODIUM CHLORIDE (POUR BTL) OPTIME
TOPICAL | Status: DC | PRN
  Administered 2023-12-27: 1000 mL

## 2023-12-27 MED ORDER — CLOPIDOGREL BISULFATE 75 MG PO TABS
75.0000 mg | ORAL_TABLET | Freq: Every day | ORAL | Status: DC
  Administered 2023-12-28: 75 mg via ORAL
  Filled 2023-12-27: qty 1

## 2023-12-27 MED ORDER — SENNOSIDES-DOCUSATE SODIUM 8.6-50 MG PO TABS: 1.0000 | ORAL_TABLET | Freq: Every evening | ORAL | Status: DC | PRN

## 2023-12-27 MED ORDER — ATORVASTATIN CALCIUM 40 MG PO TABS
40.0000 mg | ORAL_TABLET | Freq: Every day | ORAL | Status: DC
  Administered 2023-12-27: 40 mg via ORAL
  Filled 2023-12-27 (×2): qty 1

## 2023-12-27 MED ORDER — PHENYLEPHRINE 80 MCG/ML (10ML) SYRINGE FOR IV PUSH (FOR BLOOD PRESSURE SUPPORT): PREFILLED_SYRINGE | INTRAVENOUS | Status: DC | PRN

## 2023-12-27 MED ORDER — ROCURONIUM BROMIDE 10 MG/ML (PF) SYRINGE
PREFILLED_SYRINGE | INTRAVENOUS | Status: DC | PRN
  Administered 2023-12-27: 60 mg via INTRAVENOUS
  Administered 2023-12-27 (×2): 10 mg via INTRAVENOUS
  Administered 2023-12-27: 20 mg via INTRAVENOUS

## 2023-12-27 MED ORDER — CHLORHEXIDINE GLUCONATE 0.12 % MT SOLN
15.0000 mL | Freq: Once | OROMUCOSAL | Status: AC
  Administered 2023-12-27: 15 mL via OROMUCOSAL
  Filled 2023-12-27: qty 15

## 2023-12-27 MED ORDER — ORAL CARE MOUTH RINSE: 15.0000 mL | Freq: Once | OROMUCOSAL | Status: AC

## 2023-12-27 MED ORDER — PANTOPRAZOLE SODIUM 40 MG PO TBEC
40.0000 mg | DELAYED_RELEASE_TABLET | Freq: Every day | ORAL | Status: DC
  Administered 2023-12-27 – 2023-12-28 (×2): 40 mg via ORAL
  Filled 2023-12-27 (×2): qty 1

## 2023-12-27 MED ORDER — HEPARIN 6000 UNIT IRRIGATION SOLUTION
Status: DC | PRN
  Administered 2023-12-27: 1

## 2023-12-27 MED ORDER — ONDANSETRON HCL 4 MG/2ML IJ SOLN: 4.0000 mg | Freq: Four times a day (QID) | INTRAMUSCULAR | Status: DC | PRN

## 2023-12-27 MED ORDER — CLEVIDIPINE BUTYRATE 0.5 MG/ML IV EMUL
INTRAVENOUS | Status: DC | PRN
  Administered 2023-12-27: 2 mg/h via INTRAVENOUS

## 2023-12-27 SURGICAL SUPPLY — 44 items
BAG COUNTER SPONGE SURGICOUNT (BAG) ×2 IMPLANT
CANISTER SUCT 3000ML PPV (MISCELLANEOUS) ×2 IMPLANT
CANNULA VESSEL 3MM 2 BLNT TIP (CANNULA) IMPLANT
CATH ROBINSON RED A/P 18FR (CATHETERS) ×2 IMPLANT
CATH SUCT 10FR WHISTLE TIP (CATHETERS) ×2 IMPLANT
CATH SUCT ARGYLE CHIMNEY 10FR (CATHETERS) IMPLANT
CLIP TI MEDIUM 6 (CLIP) ×2 IMPLANT
CLIP TI WIDE RED SMALL 6 (CLIP) ×2 IMPLANT
COVER PROBE W GEL 5X96 (DRAPES) IMPLANT
DERMABOND ADVANCED .7 DNX12 (GAUZE/BANDAGES/DRESSINGS) ×2 IMPLANT
DRAIN CHANNEL 15F RND FF W/TCR (WOUND CARE) IMPLANT
ELECT REM PT RETURN 9FT ADLT (ELECTROSURGICAL) ×2
ELECTRODE REM PT RTRN 9FT ADLT (ELECTROSURGICAL) ×2 IMPLANT
EVACUATOR SILICONE 100CC (DRAIN) IMPLANT
GLOVE SURG SS PI 7.5 STRL IVOR (GLOVE) ×6 IMPLANT
GOWN STRL REUS W/ TWL LRG LVL3 (GOWN DISPOSABLE) ×4 IMPLANT
GOWN STRL REUS W/ TWL XL LVL3 (GOWN DISPOSABLE) ×2 IMPLANT
HEMOSTAT SNOW SURGICEL 2X4 (HEMOSTASIS) IMPLANT
INSERT FOGARTY SM (MISCELLANEOUS) IMPLANT
KIT BASIN OR (CUSTOM PROCEDURE TRAY) ×2 IMPLANT
KIT CATH SUCT 8FR (CATHETERS) IMPLANT
KIT SHUNT ARGYLE CAROTID ART 6 (VASCULAR PRODUCTS) IMPLANT
KIT TURNOVER KIT B (KITS) ×2 IMPLANT
NDL HYPO 25GX1X1/2 BEV (NEEDLE) IMPLANT
NEEDLE HYPO 25GX1X1/2 BEV (NEEDLE)
NS IRRIG 1000ML POUR BTL (IV SOLUTION) ×6 IMPLANT
PACK CAROTID (CUSTOM PROCEDURE TRAY) ×2 IMPLANT
PAD ARMBOARD 7.5X6 YLW CONV (MISCELLANEOUS) ×4 IMPLANT
PATCH VASC XENOSURE 1X6 (Vascular Products) IMPLANT
POSITIONER HEAD DONUT 9IN (MISCELLANEOUS) ×2 IMPLANT
SET WALTER ACTIVATION W/DRAPE (SET/KITS/TRAYS/PACK) IMPLANT
SHUNT CAROTID BYPASS 10 (VASCULAR PRODUCTS) IMPLANT
SHUNT CAROTID BYPASS 12FRX15.5 (VASCULAR PRODUCTS) IMPLANT
SURGIFLO W/THROMBIN 8M KIT (HEMOSTASIS) IMPLANT
SUT ETHILON 3 0 PS 1 (SUTURE) IMPLANT
SUT PROLENE 6 0 BV (SUTURE) ×4 IMPLANT
SUT PROLENE 7 0 BV1 MDA (SUTURE) IMPLANT
SUT SILK 3-0 18XBRD TIE 12 (SUTURE) IMPLANT
SUT VIC AB 3-0 SH 27X BRD (SUTURE) ×4 IMPLANT
SUT VIC AB 3-0 X1 27 (SUTURE) ×2 IMPLANT
SYR CONTROL 10ML LL (SYRINGE) IMPLANT
TOWEL GREEN STERILE (TOWEL DISPOSABLE) ×2 IMPLANT
TUBE CONNECTING 12X1/4 (SUCTIONS) IMPLANT
WATER STERILE IRR 1000ML POUR (IV SOLUTION) ×2 IMPLANT

## 2023-12-27 NOTE — Transfer of Care (Signed)
 Immediate Anesthesia Transfer of Care Note  Patient: Arthur Guzman  Procedure(s) Performed: RIGHT CAROTID ENDARTERECTOMY (Right) PATCH ANGIOPLASTY (Right: Neck)  Patient Location: PACU  Anesthesia Type:General  Level of Consciousness: awake, alert , and oriented  Airway & Oxygen Therapy: Patient Spontanous Breathing and Patient connected to face mask oxygen  Post-op Assessment: Report given to RN and Post -op Vital signs reviewed and stable  Post vital signs: Reviewed and stable  Last Vitals:  Vitals Value Taken Time  BP 117/72 12/27/23 1023  Temp 36.4 C 12/27/23 1023  Pulse 75 12/27/23 1030  Resp 19 12/27/23 1030  SpO2 99 % 12/27/23 1030  Vitals shown include unfiled device data.  Last Pain:  Vitals:   12/27/23 1023  TempSrc:   PainSc: 0-No pain         Complications: No notable events documented.

## 2023-12-27 NOTE — Op Note (Signed)
 Patient name: Arthur Guzman MRN: [id number] DOB: 1945/12/24 Sex: male  12/27/2023 Pre-operative Diagnosis: ? symptomatic   right carotid stenosis Post-operative diagnosis:  Same Surgeon:  Malvina New Assistants:  Maurilio Collet, PA Procedure:    right carotid Endarterectomy with bovine pericardial patch angioplasty Anesthesia:  General Blood Loss:  100 cc Specimens:  none  Findings:  90  %stenosis; Thrombus:  none  Indications: This is a 78 year old gentleman with bilateral carotid stenosis, right greater than left.  There was concerns that this may be symptomatic as he has been having issues with left arm numbness and dizziness.  He comes in today for endarterectomy.  Procedure:  The patient was identified in the holding area and taken to [facility] OR ROOM 11  The patient was then placed supine on the table.   General endotrachial anesthesia was administered.  The patient was prepped and draped in the usual sterile fashion.  A time out was called and antibiotics were administered.  A PA was necessary to expedite the procedure and assist with technical details.  She helped with exposure by providing suction and retraction.  She helped with the anastomosis by following suture.  She helped with closure.  The incision was made along the anterior border of the right sternocleidomastoid muscle.  Cautery was used to dissect through the subcutaneous tissue.  The platysma muscle was divided with cautery.  The internal jugular vein was exposed along its anterior medial border.  The common facial vein was exposed and then divided between 2-0 silk ties and metal clips.  The common carotid artery was then circumferentially exposed and encircled with an umbilical tape.  The vagus nerve was identified and protected.  Next sharp dissection was used to expose the external carotid artery and the superior thyroid artery.  The were encircled with a blue vessel loop and a 2-0 silk tie respectively.  Finally, the  internal carotid was carefully dissected free.  An umbilical tape was placed around the internal carotid artery distal to the diseased segment.  The hypoglossal nerve was visualized throughout and protected.  The patient was given systemic heparinization.  A bovine carotid patch was selected and prepared on the back table.  A 10 french shunt was also prepared.  After blood pressure readings were appropriate and the heparin  had been given time to circulate, the internal carotid artery was occluded with a baby Gregory clamp.  The external and common carotid arteries were then occluded with vascular clamps and the 2-0 tie tightened on the superior thyroid artery.  A #11 blade was used to make an arteriotomy in the common carotid artery.  This was extended with Potts scissors along the anterior and lateral border of the common and internal carotid artery.  Approximately 90% stenosis was identified.  There was no thrombus identified.  The 10 french shunt was not place as there was excellent back bleeding.  A kleiner kuntz elevator was used to perform endarterectomy.  An eversion endarterectomy was performed in the external carotid artery.  A good distal endpoint was obtained in the internal carotid artery.  The specimen was removed and sent to pathology.  Heparinized saline was used to irrigate the endarterectomized field.  All potential embolic debris was removed.  Bovine pericardial patch angioplasty was then performed using a running 6-0 Prolene.  The common internal and external carotid arteries were all appropriately flushed. The artery was again irrigated with heparin  saline.  The anastomosis was then secured. The clamp was first released  on the external carotid artery followed by the common carotid artery approximately 30 seconds later, bloodflow was reestablish through the internal carotid artery.  Next, a hand-held  Doppler was used to evaluate the signals in the common, external, and internal  carotid  arteries, all of which had appropriate signals. I then administered  50 mg protamine . The wound was then irrigated.  After hemostasis was achieved, the carotid sheath was reapproximated with 3-0 Vicryl. The  platysma muscle was reapproximated with running 3-0 Vicryl. The skin  was closed with 4-0 Vicryl. Dermabond was placed on the skin. The  patient was then successfully extubated. His neurologic exam was  similar to his preprocedural exam. The patient was then taken to recovery room  in stable condition. There were no complications.     Disposition:  To PACU in stable condition.  Relevant Operative Details: Most of the stenosis was in the internal carotid artery beginning at the bifurcation.  The stenosis was at least 90%.  There was a fair amount of inflammatory tissue around the carotid artery externally.  After endarterectomy was performed, a bovine patch was placed.  I did not use a shunt because there was excellent backbleeding.  The patient awoke neurologically intact.  ALONSO Malvina New, M.D., [facility] [place] Office: [phone] Pager:  [phone]

## 2023-12-27 NOTE — Anesthesia Postprocedure Evaluation (Signed)
 Anesthesia Post Note  Patient: Arthur Guzman  Procedure(s) Performed: RIGHT CAROTID ENDARTERECTOMY (Right) PATCH ANGIOPLASTY (Right: Neck)     Patient location during evaluation: PACU Anesthesia Type: General Level of consciousness: awake and alert Pain management: pain level controlled Vital Signs Assessment: post-procedure vital signs reviewed and stable Respiratory status: spontaneous breathing, nonlabored ventilation, respiratory function stable and patient connected to nasal cannula oxygen Cardiovascular status: blood pressure returned to baseline and stable Postop Assessment: no apparent nausea or vomiting Anesthetic complications: no   No notable events documented.  Last Vitals:  Vitals:   12/27/23 1223 12/27/23 1300  BP: (!) 117/55 123/70  Pulse: 61 60  Resp: 15 13  Temp: 36.4 C 36.4 C  SpO2: 99% 99%    Last Pain:  Vitals:   12/27/23 1300  TempSrc: Oral  PainSc:                  [name]

## 2023-12-27 NOTE — Progress Notes (Signed)
 Patient received from PACU, V/S taken, CCMD notified, orientation given about the unit, call bell in reach.  NIH score is 0.  12/27/23 1223  Vitals  Temp 97.6 F (36.4 C)  Temp Source Oral  BP (!) 117/55  MAP (mmHg) 73  BP Location Right Arm  BP Method Automatic  Patient Position (if appropriate) Lying  Pulse Rate 61  Pulse Rate Source Monitor  ECG Heart Rate 64  Resp 15  Level of Consciousness  Level of Consciousness Alert  MEWS COLOR  MEWS Score Color Green  Oxygen Therapy  SpO2 99 %  O2 Device Room Air  Art Line  Arterial Line BP 158/55  Arterial Line MAP (mmHg) 80 mmHg  Arterial Line Location Left radial  Art Line Wave Form Appropriate wave forms  Pain Assessment  Pain Scale 0-10  Pain Score 8  Pain Type Surgical pain  Pain Location Neck  Pain Orientation Right  Pain Descriptors / Indicators Aching  Pain Onset Gradual  Patients Stated Pain Goal 0  Pain Intervention(s) Medication (See eMAR)  MEWS Score  MEWS Temp 0  MEWS Systolic 0  MEWS Pulse 0  MEWS RR 0  MEWS LOC 0  MEWS Score 0

## 2023-12-27 NOTE — Anesthesia Procedure Notes (Signed)
 Arterial Line Insertion Start/End1/09/2024 7:05 AM, 12/27/2023 7:10 AM Performed by: Erma Thom SAUNDERS, MD, Mannie Krystal LABOR, CRNA, CRNA  Patient location: Pre-op. Preanesthetic checklist: patient identified, IV checked, site marked, risks and benefits discussed, surgical consent, monitors and equipment checked, pre-op evaluation, timeout performed and anesthesia consent Lidocaine  1% used for infiltration Left, radial was placed Catheter size: 20 G Hand hygiene performed , maximum sterile barriers used  and Seldinger technique used Allen's test indicative of satisfactory collateral circulation Attempts: 1 Procedure performed without using ultrasound guided technique. Following insertion, dressing applied and Biopatch. Post procedure assessment: normal

## 2023-12-27 NOTE — Plan of Care (Signed)
  Problem: Respiratory: Goal: Ability to achieve and maintain a regular respiratory rate will improve Outcome: Progressing   Problem: Clinical Measurements: Goal: Postoperative complications will be avoided or minimized Outcome: Progressing   Problem: Education: Goal: Knowledge of General Education information will improve Description: Including pain rating scale, medication(s)/side effects and non-pharmacologic comfort measures Outcome: Progressing   Problem: Clinical Measurements: Goal: Will remain free from infection Outcome: Progressing   Problem: Clinical Measurements: Goal: Diagnostic test results will improve Outcome: Progressing   Problem: Clinical Measurements: Goal: Respiratory complications will improve Outcome: Progressing

## 2023-12-27 NOTE — Progress Notes (Signed)
 Pt did not take his aspirin and plavix today. Last dose was yesterday. States he did not want to take them on an empty stomach. Per dr. Myra Gianotti- do not administer.

## 2023-12-27 NOTE — Anesthesia Procedure Notes (Signed)
 Procedure Name: Intubation Date/Time: 12/27/2023 7:55 AM  Performed by: Mannie Krystal LABOR, CRNAPre-anesthesia Checklist: Patient identified, Emergency Drugs available, Suction available and Patient being monitored Patient Re-evaluated:Patient Re-evaluated prior to induction Oxygen Delivery Method: Circle system utilized Preoxygenation: Pre-oxygenation with 100% oxygen Induction Type: IV induction Ventilation: Mask ventilation without difficulty Laryngoscope Size: Miller and 2 Grade View: Grade I Tube type: Oral Tube size: 7.5 mm Number of attempts: 1 Airway Equipment and Method: Stylet and Oral airway Placement Confirmation: ETT inserted through vocal cords under direct vision, positive ETCO2 and breath sounds checked- equal and bilateral Tube secured with: Tape Dental Injury: Teeth and Oropharynx as per pre-operative assessment

## 2023-12-27 NOTE — Progress Notes (Signed)
 Pt's right neck incision site swelling seems to be bigger than before, no any oozing, paged the MD and informed. NIH score is 0, patient denies severe pain.

## 2023-12-27 NOTE — Interval H&P Note (Signed)
 History and Physical Interval Note:  12/27/2023 7:33 AM  [name]  has presented today for surgery, with the diagnosis of Carotid stenosis, bilateral.  The various methods of treatment have been discussed with the patient and family. After consideration of risks, benefits and other options for treatment, the patient has consented to  Procedure(s): RIGHT CAROTID ENDARTERECTOMY (Right) as a surgical intervention.  The patient's history has been reviewed, patient examined, no change in status, stable for surgery.  I have reviewed the patient's chart and labs.  Questions were answered to the patient's satisfaction.     Arthur Guzman

## 2023-12-28 ENCOUNTER — Encounter (HOSPITAL_COMMUNITY): Payer: Self-pay | Admitting: Surgery

## 2023-12-28 LAB — CBC
HCT: 33.1 % — ABNORMAL LOW (ref 39.0–52.0)
Hemoglobin: 11.5 g/dL — ABNORMAL LOW (ref 13.0–17.0)
MCH: 31.8 pg (ref 26.0–34.0)
MCHC: 34.7 g/dL (ref 30.0–36.0)
MCV: 91.4 fL (ref 80.0–100.0)
Platelets: 163 10*3/uL (ref 150–400)
RBC: 3.62 MIL/uL — ABNORMAL LOW (ref 4.22–5.81)
RDW: 13.3 % (ref 11.5–15.5)
WBC: 20.9 10*3/uL — ABNORMAL HIGH (ref 4.0–10.5)
nRBC: 0 % (ref 0.0–0.2)

## 2023-12-28 LAB — BASIC METABOLIC PANEL
Anion gap: 7 (ref 5–15)
BUN: 22 mg/dL (ref 8–23)
CO2: 23 mmol/L (ref 22–32)
Calcium: 8.4 mg/dL — ABNORMAL LOW (ref 8.9–10.3)
Chloride: 105 mmol/L (ref 98–111)
Creatinine, Ser: 1.18 mg/dL (ref 0.61–1.24)
GFR, Estimated: >60 mL/min (ref >60)
Glucose, Bld: 124 mg/dL — ABNORMAL HIGH (ref 70–99)
Potassium: 3.8 mmol/L (ref 3.5–5.1)
Sodium: 135 mmol/L (ref 135–145)

## 2023-12-28 LAB — LIPID PANEL
Cholesterol: 78 mg/dL (ref 0–200)
HDL: 33 mg/dL — ABNORMAL LOW (ref >40)
LDL Cholesterol: 30 mg/dL (ref 0–99)
Total CHOL/HDL Ratio: 2.4 {ratio}
Triglycerides: 73 mg/dL (ref <150)
VLDL: 15 mg/dL (ref 0–40)

## 2023-12-28 MED ORDER — OXYCODONE HCL 5 MG PO TABS: 5.0000 mg | ORAL_TABLET | Freq: Four times a day (QID) | ORAL | 0 refills | Status: DC | PRN

## 2023-12-28 NOTE — Plan of Care (Signed)
  Problem: Education: Goal: Knowledge of discharge needs will improve Outcome: Progressing

## 2023-12-28 NOTE — Discharge Instructions (Signed)
   [facility]  Discharge Instructions   Carotid Endarterectomy (CEA)  Please refer to the following instructions for your post-procedure care. Your surgeon or physician assistant will discuss any changes with you.  Activity  You are encouraged to walk as much as you can. You can slowly return to normal activities but must avoid strenuous activity and heavy lifting until your doctor tell you it's OK. Avoid activities such as vacuuming or swinging a golf club. You can drive after one week if you are comfortable and you are no longer taking prescription pain medications. It is normal to feel tired for serval weeks after your surgery. It is also normal to have difficulty with sleep habits, eating, and bowel movements after surgery. These will go away with time.  Bathing/Showering  You may shower after you come home. Do not soak in a bathtub, hot tub, or swim until the incision heals completely.  Incision Care  Shower every day. Clean your incision with mild soap and water. Pat the area dry with a clean towel. You do not need a bandage unless otherwise instructed. Do not apply any ointments or creams to your incision. You may have skin glue on your incision. Do not peel it off. It will come off on its own in about one week. Your incision may feel thickened and raised for several weeks after your surgery. This is normal and the skin will soften over time. For Men Only: It's OK to shave around the incision but do not shave the incision itself for 2 weeks. It is common to have numbness under your chin that could last for several months.  Diet  Resume your normal diet. There are no special food restrictions following this procedure. A low fat/low cholesterol diet is recommended for all patients with vascular disease. In order to heal from your surgery, it is CRITICAL to get adequate nutrition. Your body requires vitamins, minerals, and protein. Vegetables are the best  source of vitamins and minerals. Vegetables also provide the perfect balance of protein. Processed food has little nutritional value, so try to avoid this.        Medications  Resume taking all of your medications unless your doctor or physician assistant tells you not to. If your incision is causing pain, you may take over-the- counter pain relievers such as acetaminophen  (Tylenol ). If you were prescribed a stronger pain medication, please be aware these medications can cause nausea and constipation. Prevent nausea by taking the medication with a snack or meal. Avoid constipation by drinking plenty of fluids and eating foods with a high amount of fiber, such as fruits, vegetables, and grains. Do not take Tylenol  if you are taking prescription pain medications.  Follow Up  Our office will schedule a follow up appointment 2-3 weeks following discharge.  Please call us  immediately for any of the following conditions  Increased pain, redness, drainage (pus) from your incision site. Fever of 101 degrees or higher. If you should develop stroke (slurred speech, difficulty swallowing, weakness on one side of your body, loss of vision) you should call 911 and go to the nearest emergency room.  Reduce your risk of vascular disease:  Stop smoking. If you would like help call [facility] at 1-800-QUIT-NOW (986-078-2376) or  at 864-830-8805. Manage your cholesterol Maintain a desired weight Control your diabetes Keep your blood pressure down  If you have any questions, please call the office at [phone].

## 2023-12-28 NOTE — Progress Notes (Signed)
 PHARMACIST LIPID MONITORING   Arthur Guzman is a 78 y.o. male admitted on 12/27/2023 with carotid stenosis.  Pharmacy has been consulted to optimize lipid-lowering therapy with the indication of secondary prevention for clinical ASCVD.  Recent Labs:  Lipid Panel (last 6 months):   Lab Results  Component Value Date   CHOL 78 12/28/2023   TRIG 73 12/28/2023   HDL 33 (L) 12/28/2023   CHOLHDL 2.4 12/28/2023   VLDL 15 12/28/2023   LDLCALC 30 12/28/2023    Hepatic function panel (last 6 months):   Lab Results  Component Value Date   AST 24 12/24/2023   ALT 21 12/24/2023   ALKPHOS 106 12/24/2023   BILITOT 0.5 12/24/2023    SCr (since admission):   Serum creatinine: 1.18 mg/dL 98/88/74 9469 Estimated creatinine clearance: 51.6 mL/min  Current therapy and lipid therapy tolerance Current lipid-lowering therapy: atorvastatin  40 mg Previous lipid-lowering therapies (if applicable): N/A Documented or reported allergies or intolerances to lipid-lowering therapies (if applicable): N/A  Assessment:   LDL 30, which is at goal of < 55. On high-intensity statin, no changes to statin therapy needed.  Plan:    1.Statin intensity (high intensity recommended for all patients regardless of the LDL):  No statin changes. The patient is already on a high intensity statin.  2.Add ezetimibe (if any one of the following):   Not indicated at this time.  3.Refer to lipid clinic:   No  4.Follow-up with:  Primary care provider - Arthur Lucie LABOR, MD  5.Follow-up labs after discharge:  No changes in lipid therapy, repeat a lipid panel in one year.      Arthur Guzman, PharmD 12/28/2023, 7:14 AM

## 2023-12-28 NOTE — Progress Notes (Addendum)
 [facility] [place]  Subjective  - did well over night no complaints   Objective (!) 111/49 76 97.8 F (36.6 C) (Oral) 16 99%  Intake/Output Summary (Last 24 hours) at 12/28/2023 0833 Last data filed at 12/28/2023 9350 Gross per 24 hour  Intake 2100.55 ml  Output 2400 ml  Net -299.45 ml    Moving all 4 ext Palpable radial pulses No neurologic deficits no tongue deviation or facial droop Right neck incision soft healing well without hematoma  Assessment/Planning: POD # 1 right CEA Voided, tol PO's pain is minimal Plan for discharge today in stable condition F/U in 2 weeks for incision check    Maurilio Deland Collet 12/28/2023 8:33 AM --  Laboratory Lab Results: Recent Labs    12/27/23 1254 12/28/23 0530  WBC 12.1* 20.9*  HGB 12.4* 11.5*  HCT 35.7* 33.1*  PLT 176 163   BMET Recent Labs    12/27/23 1254 12/28/23 0530  NA  --  135  K  --  3.8  CL  --  105  CO2  --  23  GLUCOSE  --  124*  BUN  --  22  CREATININE 0.94 1.18  CALCIUM   --  8.4*    COAG Lab Results  Component Value Date   INR 1.1 12/24/2023   INR 1.0 08/25/2019   No results found for: PTT  I have independently interviewed and examined patient and agree with PA assessment and plan above.  Okay to restart Plavix  and discharge home today with follow-up wound check in 2 to 3 weeks.  Chandani Rogowski C. Sheree, MD [facility] [place] Office: [phone] Pager: [phone]

## 2023-12-30 NOTE — Discharge Summary (Signed)
 [facility] Discharge Summary   Patient ID:  Arthur Guzman MRN: [id number] DOB/AGE: 1946-04-15 78 y.o.  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 male, who is referred for evaluation of carotid stenosis.  He presented with symptomatic right carotid stenosis75-80% range  numbness has been in the left arm.    Hospital Course:  Arthur Guzman is a 78 y.o. male is S/P Right Procedure(s): RIGHT CAROTID ENDARTERECTOMY PATCH ANGIOPLASTY Post op he was stable without neurologic deficits.  No tongue deviation or facial droop.  Ins was soft wiothout hematoma.   POD # 1 right CEA Voided, tol PO's pain is minimal Plan for discharge today in stable condition F/U in 2 weeks for incision check  Significant Diagnostic Studies: CBC Lab Results  Component Value Date   WBC 20.9 (H) 12/28/2023   HGB 11.5 (L) 12/28/2023   HCT 33.1 (L) 12/28/2023   MCV 91.4 12/28/2023   PLT 163 12/28/2023    BMET    Component Value Date/Time   NA 135 12/28/2023 0530   NA 144 06/12/2023 0855   K 3.8 12/28/2023 0530   CL 105 12/28/2023 0530   CO2 23 12/28/2023 0530   GLUCOSE 124 (H) 12/28/2023 0530   BUN 22 12/28/2023 0530   BUN 14 06/12/2023 0855   CREATININE 1.18 12/28/2023 0530   CALCIUM  8.4 (L) 12/28/2023 0530   GFRNONAA >60 12/28/2023 0530   GFRAA >60 08/27/2019 0327   COAG Lab Results  Component Value Date   INR 1.1  12/24/2023   INR 1.0 08/25/2019     Disposition:  Discharge to :Home Discharge Instructions     Activity as tolerated - No restrictions   Complete by: As directed    Daily activity as usual   Call MD for:  redness, tenderness, or signs of infection (pain, swelling, bleeding, redness, odor or green/yellow discharge around incision site)   Complete by: As directed    Call MD for:  severe or increased pain, loss or decreased feeling  in affected limb(s)   Complete by: As directed    Call MD for:  temperature >100.5   Complete by: As directed    Increase activity slowly   Complete by: As directed    Walk with assistance use walker or cane as needed   Lifting restrictions   Complete by: As directed    No lifting > 5 lbs for 1 week   Resume previous diet   Complete by: As directed       Allergies as of 12/28/2023       Reactions   Antihistamines, Chlorpheniramine-type Other (See Comments)   Antihistamines make me fidgety.        Medication List     TAKE these medications    aspirin  EC 81 MG tablet Take 1 tablet (81 mg total) by mouth daily.   atorvastatin  40 MG tablet Commonly known as: LIPITOR Take 40 mg by mouth daily.   clopidogrel  75 MG  tablet Commonly known as: PLAVIX  Take 1 tablet (75 mg total) by mouth daily.   losartan  25 MG tablet Commonly known as: COZAAR  Take 25 mg by mouth daily.   oxyCODONE  5 MG immediate release tablet Commonly known as: Oxy IR/ROXICODONE  Take 1 tablet (5 mg total) by mouth every 6 (six) hours as needed for moderate pain (pain score 4-6).       Verbal and written Discharge instructions given to the patient. Wound care per Discharge AVS  Follow-up Information     Serene Gaile ORN, MD Follow up in 2 week(s).   Specialties: Vascular Surgery, Cardiology Why: Office will call you to arrange your appt (sent) Contact information: [address] [place] [address] [phone]                 Signed: Maurilio Deland Guzman 12/30/2023, 10:09 AM --- For VQI Registry use --- Instructions: Press F2 to tab through selections.  Delete question if not applicable.   Modified Rankin score at D/C (0-6): Rankin Score=0  IV medication needed for:  1. Hypertension: No 2. Hypotension: No  Post-op Complications: No  1. Post-op CVA or TIA: No  If yes: Event classification (right eye, left eye, right cortical, left cortical, verterobasilar, other):   If yes: Timing of event (intra-op, <6 hrs post-op, >=6 hrs post-op, unknown):   2. CN injury: No  If yes: CN  injuried   3. Myocardial infarction: No  If yes: Dx by (EKG or clinical, Troponin):   4.  CHF: No  5.  Dysrhythmia (new): No  6. Wound infection: No  7. Reperfusion symptoms: No  8. Return to OR: No  If yes: return to OR for (bleeding, neurologic, other CEA incision, other):   Discharge medications: Statin use:  Yes ASA use:  Yes Beta blocker use:  No  for medical reason not indicated ACE-Inhibitor use:  No  for medical reason not indicated P2Y12 Antagonist use: [ ]  None, [ x] Plavix , [ ]  Plasugrel, [ ]  Ticlopinine, [ ]  Ticagrelor, [ ]  Other, [ ]  No for medical reason, [ ]  Non-compliant, [ ]  Not-indicated Anti-coagulant use:  [x ] None, [ ]  Warfarin, [ ]  Rivaroxaban, [ ]  Dabigatran, [ ]  Other, [ ]  No for medical reason, [ ]  Non-compliant, [ ]  Not-indicated

## 2024-01-09 DIAGNOSIS — I10 Essential (primary) hypertension: Secondary | ICD-10-CM | POA: Diagnosis not present

## 2024-01-09 DIAGNOSIS — G43109 Migraine with aura, not intractable, without status migrainosus: Secondary | ICD-10-CM | POA: Diagnosis not present

## 2024-01-09 DIAGNOSIS — Z0000 Encounter for general adult medical examination without abnormal findings: Secondary | ICD-10-CM | POA: Diagnosis not present

## 2024-01-09 DIAGNOSIS — E782 Mixed hyperlipidemia: Secondary | ICD-10-CM | POA: Diagnosis not present

## 2024-01-09 DIAGNOSIS — Z8673 Personal history of transient ischemic attack (TIA), and cerebral infarction without residual deficits: Secondary | ICD-10-CM | POA: Diagnosis not present

## 2024-01-10 ENCOUNTER — Encounter: Payer: Self-pay | Admitting: Cardiology

## 2024-01-10 ENCOUNTER — Ambulatory Visit: Payer: Medicare Other | Attending: Cardiology | Admitting: Cardiology

## 2024-01-10 VITALS — BP 138/62 | HR 71 | Resp 16 | Ht 69.0 in | Wt 168.0 lb

## 2024-01-10 DIAGNOSIS — I6523 Occlusion and stenosis of bilateral carotid arteries: Secondary | ICD-10-CM | POA: Diagnosis not present

## 2024-01-10 DIAGNOSIS — I10 Essential (primary) hypertension: Secondary | ICD-10-CM | POA: Diagnosis not present

## 2024-01-10 NOTE — Progress Notes (Signed)
  Cardiology Office Note:  .   Date:  01/10/2024  ID:  Arthur Guzman, DOB March 19, 1946, MRN [id number] PCP: Arthur Found, MD  [facility] Providers Cardiologist:  Truett Mainland, MD PCP: Arthur Found, MD  Chief Complaint  Patient presents with   Bilateral carotid artery stenosis   Essential hypertension   Follow-up    3 month      History of Present Illness: .    Arthur Guzman is a 78 y.o. male with hypertension, controlled hyperlipidemia, h/o complex migraine/TIA, symptomatic right carotid stenosis now s/p Rt CEA (12/2023)   Since his last visit with me, patient underwent right carotid endarterectomy by Dr. Myra Gianotti on 12/27/2023.  He is doing well since surgery, has not any new symptoms or complaints.  Vitals:   01/10/24 1052  BP: 138/62  Pulse: 71  Resp: 16  SpO2: 98%     ROS:  Review of Systems  Cardiovascular:  Negative for chest pain, dyspnea on exertion, leg swelling, palpitations and syncope.     Studies Reviewed: Marland Kitchen         Independently interpreted 12/2023: Chol 78, TG 73, HDL 33, LDL 30 Hb 11.5 Cr 9.52    Physical Exam:   Physical Exam Vitals and nursing note reviewed.  Constitutional:      General: He is not in acute distress. Neck:     Vascular: No JVD.  Cardiovascular:     Rate and Rhythm: Normal rate and regular rhythm.     Pulses:          Carotid pulses are  on the left side with bruit.    Heart sounds: Normal heart sounds. No murmur heard.    Comments: Rt CEA scar Pulmonary:     Effort: Pulmonary effort is normal.     Breath sounds: Normal breath sounds. No wheezing or rales.  Musculoskeletal:     Right lower leg: No edema.     Left lower leg: No edema.      VISIT DIAGNOSES:   ICD-10-CM   1. Bilateral carotid artery stenosis  I65.23     2. Essential hypertension  I10        ASSESSMENT AND PLAN: .    Arthur Guzman is a 78 y.o. male with hypertension, controlled hyperlipidemia, h/o  complex migraine/TIA, symptomatic right carotid stenosis now s/p Rt CEA (12/2023)   Mixed hyperlipidemia: Very well controlled. Continue Lipitor 40 mg daily.   Carotid artery disease: S/p Rt CEA for 90% symptomatic Rt carotid stenosis (12/2023) 70% asymptomatic Lt ICA stenosis.  Surveillance as per Dr. Estanislado Spire recommendations. Continue aspirin and Plavix for now, until specified otherwise by Dr. Myra Gianotti. When DAPT is completed, I would recommend Plavix monotherapy lifelong.   Continue losartan 25 mg daily.     Hypertension: Well-controlled.      F/u in 6 months  Signed, Elder Negus, MD

## 2024-01-10 NOTE — Patient Instructions (Signed)
Medication Instructions:   Your physician recommends that you continue on your current medications as directed. Please refer to the Current Medication list given to you today.  *If you need a refill on your cardiac medications before your next appointment, please call your pharmacy*     Follow-Up: At [facility], you and your health needs are our priority.  As part of our continuing mission to provide you with exceptional heart care, we have created designated Provider Care Teams.  These Care Teams include your primary Cardiologist (physician) and Advanced Practice Providers (APPs -  Physician Assistants and Nurse Practitioners) who all work together to provide you with the care you need, when you need it.  We recommend signing up for the patient portal called "MyChart".  Sign up information is provided on this After Visit Summary.  MyChart is used to connect with patients for Virtual Visits (Telemedicine).  Patients are able to view lab/test results, encounter notes, upcoming appointments, etc.  Non-urgent messages can be sent to your provider as well.   To learn more about what you can do with MyChart, go to [email or web address].    Your next appointment:   6 month(s)  Provider:   Elder Negus, MD

## 2024-01-13 ENCOUNTER — Telehealth: Payer: Self-pay | Admitting: Surgery

## 2024-01-20 ENCOUNTER — Ambulatory Visit (INDEPENDENT_AMBULATORY_CARE_PROVIDER_SITE_OTHER): Payer: Medicare Other | Admitting: Physician Assistant

## 2024-01-20 VITALS — BP 144/64 | HR 72 | Temp 98.0°F | Resp 18 | Ht 69.0 in | Wt 166.0 lb

## 2024-01-20 DIAGNOSIS — I6523 Occlusion and stenosis of bilateral carotid arteries: Secondary | ICD-10-CM

## 2024-01-20 NOTE — Telephone Encounter (Signed)
Pt appointment scheduled.

## 2024-01-20 NOTE — Progress Notes (Signed)
  POST OPERATIVE OFFICE NOTE    CC:  F/u for surgery  HPI:  This is a 78 y.o. male who is s/p right carotid endarterectomy with bovine patch angioplasty by Dr. Myra Gianotti on 12/27/2023.  Lesion was thought to be symptomatic due to left arm weakness as well as dizziness.  He tolerated procedure well and feels like he is back to his baseline level of mobility.  He believes the right neck incision has completely healed.  He is on aspirin, Plavix, statin daily.  His cardiologist would like him to be on Plavix lifelong however would like vascular to determine the duration of dual antiplatelet therapy.  Allergies  Allergen Reactions   Antihistamines, Chlorpheniramine-Type Other (See Comments)    Antihistamines make me fidgety.    Current Outpatient Medications  Medication Sig Dispense Refill   aspirin EC 81 MG EC tablet Take 1 tablet (81 mg total) by mouth daily.     atorvastatin (LIPITOR) 40 MG tablet Take 40 mg by mouth daily.     clopidogrel (PLAVIX) 75 MG tablet Take 1 tablet (75 mg total) by mouth daily. 90 tablet 3   losartan (COZAAR) 25 MG tablet Take 25 mg by mouth daily.     No current facility-administered medications for this visit.     ROS:  See HPI  Physical Exam:  Vitals:   01/20/24 0817 01/20/24 0820  BP: (!) 148/67 (!) 144/64  Pulse: 72   Resp: 18   Temp: 98 F (36.7 C)   TempSrc: Temporal   SpO2: 97%   Weight: 166 lb (75.3 kg)   Height: 5\' 9"  (1.753 m)     Incision: Right neck incision healed Extremities: Moving all extremities well Neuro: Cranial nerves grossly intact  Assessment/Plan:  This is a 78 y.o. male who is s/p: Right carotid endarterectomy with bovine patch angioplasty by Dr. Myra Gianotti on 12/27/2023 due to suspected symptomatic right ICA lesion  Subjectively the patient denies any further left-sided weakness and also denies any neurological events since surgery.  Right neck incision has completely healed.  No restrictions from vascular at this point.  He  will continue his Plavix lifelong per cardiology.  Okay to discontinue aspirin.  He will continue his statin daily.  We will check a carotid duplex in 6 months.  Preoperatively left ICA measured about 60 to 70% stenosis.  This continues to be asymptomatic.  Mr. Messler knows to seek immediate medical attention if he develops any strokelike symptoms.    Emilie Rutter, PA-C [facility] [phone]  Clinic MD:  Myra Gianotti

## 2024-01-31 ENCOUNTER — Other Ambulatory Visit: Payer: Self-pay

## 2024-01-31 DIAGNOSIS — I6523 Occlusion and stenosis of bilateral carotid arteries: Secondary | ICD-10-CM

## 2024-02-07 DIAGNOSIS — H40013 Open angle with borderline findings, low risk, bilateral: Secondary | ICD-10-CM | POA: Diagnosis not present

## 2024-02-12 DIAGNOSIS — R7309 Other abnormal glucose: Secondary | ICD-10-CM | POA: Diagnosis not present

## 2024-02-12 DIAGNOSIS — I10 Essential (primary) hypertension: Secondary | ICD-10-CM | POA: Diagnosis not present

## 2024-02-12 DIAGNOSIS — E782 Mixed hyperlipidemia: Secondary | ICD-10-CM | POA: Diagnosis not present

## 2024-02-12 DIAGNOSIS — E538 Deficiency of other specified B group vitamins: Secondary | ICD-10-CM | POA: Diagnosis not present

## 2024-03-03 DIAGNOSIS — D0439 Carcinoma in situ of skin of other parts of face: Secondary | ICD-10-CM | POA: Diagnosis not present

## 2024-03-27 ENCOUNTER — Other Ambulatory Visit: Payer: Self-pay | Admitting: Cardiology

## 2024-03-27 DIAGNOSIS — I10 Essential (primary) hypertension: Secondary | ICD-10-CM

## 2024-07-07 DIAGNOSIS — K080 Exfoliation of teeth due to systemic causes: Secondary | ICD-10-CM | POA: Diagnosis not present

## 2024-07-08 ENCOUNTER — Encounter: Payer: Self-pay | Admitting: Cardiology

## 2024-07-08 ENCOUNTER — Ambulatory Visit: Attending: Cardiology | Admitting: Cardiology

## 2024-07-08 VITALS — BP 124/60 | HR 67 | Ht 69.0 in | Wt 167.0 lb

## 2024-07-08 DIAGNOSIS — I10 Essential (primary) hypertension: Secondary | ICD-10-CM

## 2024-07-08 DIAGNOSIS — E782 Mixed hyperlipidemia: Secondary | ICD-10-CM | POA: Diagnosis not present

## 2024-07-08 DIAGNOSIS — I6523 Occlusion and stenosis of bilateral carotid arteries: Secondary | ICD-10-CM

## 2024-07-08 NOTE — Patient Instructions (Signed)
 Medication Instructions:   No changes during this visit. Continue your current routine.   *If you need a refill on your cardiac medications before your next appointment, please call your pharmacy*  Follow-Up: At [facility], you and your health needs are our priority.  As part of our continuing mission to provide you with exceptional heart care, our providers are all part of one team.  This team includes your primary Cardiologist (physician) and Advanced Practice Providers or APPs (Physician Assistants and Nurse Practitioners) who all work together to provide you with the care you need, when you need it.  Your next appointment:   1 year(s)  Provider:   Newman JINNY Lawrence, MD    We recommend signing up for the patient portal called MyChart.  Sign up information is provided on this After Visit Summary.  MyChart is used to connect with patients for Virtual Visits (Telemedicine).  Patients are able to view lab/test results, encounter notes, upcoming appointments, etc.  Non-urgent messages can be sent to your provider as well.   To learn more about what you can do with MyChart, go to [email or web address].

## 2024-07-08 NOTE — Progress Notes (Signed)
  Cardiology Office Note:  .   Date:  07/08/2024  ID:  NAKUL AVINO, DOB 1946-09-08, MRN [id number] PCP: [name], MD  [facility] Providers Cardiologist:  Newman Lawrence, MD PCP: [name], MD  Chief Complaint  Patient presents with   Hypertension   Hyperlipidemia   Coronary Artery Disease      History of Present Illness: .    WISTER HOEFLE is a 78 y.o. male with hyperlipidemia, h/o complex migraine/TIA, symptomatic right carotid stenosis now s/p Rt CEA (12/2023)  Patient is doing well.  Blood pressure is controlled without losartan .  He is now on Plavix  monotherapy.  He has no complaints today.    Vitals:   07/08/24 0946  BP: 124/60  Pulse: 67  SpO2: 96%      ROS:  Review of Systems  Cardiovascular:  Negative for chest pain, dyspnea on exertion, leg swelling, palpitations and syncope.     Studies Reviewed: SABRA        EKG 07/08/2024: Normal sinus rhythm Left anterior fascicular block When compared with ECG of 10-Aug-2023 09:15, No significant change was found     Labs 12/2023: Chol 78, TG 73, HDL 33, LDL 30 Hb 11.5 Cr 8.81    Physical Exam:   Physical Exam Vitals and nursing note reviewed.  Constitutional:      General: He is not in acute distress. Neck:     Vascular: No JVD.  Cardiovascular:     Rate and Rhythm: Normal rate and regular rhythm.     Pulses:          Carotid pulses are  on the left side with bruit.    Heart sounds: Normal heart sounds. No murmur heard.    Comments: Rt CEA scar Pulmonary:     Effort: Pulmonary effort is normal.     Breath sounds: Normal breath sounds. No wheezing or rales.  Musculoskeletal:     Right lower leg: No edema.     Left lower leg: No edema.      VISIT DIAGNOSES:   ICD-10-CM   1. Bilateral carotid artery stenosis  I65.23 EKG 12-Lead    2. Essential hypertension  I10 EKG 12-Lead    3. Mixed hyperlipidemia  E78.2 EKG 12-Lead        ASSESSMENT AND PLAN: .     LITTLE WINTON is a 78 y.o. male with hyperlipidemia, h/o complex migraine/TIA, symptomatic right carotid stenosis now s/p Rt CEA (12/2023)   Mixed hyperlipidemia: Very well controlled. Continue Lipitor 40 mg daily.   Carotid artery disease: S/p Rt CEA for 90% symptomatic Rt carotid stenosis (12/2023) 70% asymptomatic Lt ICA stenosis.  Surveillance as per Dr. Russ recommendations. Continue Plavix  monotherapy. Previously hypotensive, but now blood pressure well-controlled without losartan .     F/u in 1 year  Signed, Newman JINNY Lawrence, MD

## 2024-07-23 NOTE — Progress Notes (Signed)
 HISTORY AND PHYSICAL     CC:  follow up. Requesting Provider:  [name], MD  HPI: This is a 78 y.o. male here for follow up for carotid artery stenosis.  Pt is s/p right carotid endarterectomy for symptomatic carotid artery stenosis on 12/27/2023 by Dr. Serene.    Pt was last seen 01/20/2024 and at that time he was doing well and incision had healed.  He was to continue Plavix  lifelong per cardiology and ok to discontinue asa.    Pt returns today for follow up.    Pt denies any amaurosis fugax, speech difficulties, weakness, numbness, paralysis or clumsiness or facial droop.    He denies any claudication, rest pain or non healing wounds.  He does not and has never smoked.   He inquires about stopping plavix  and taking baby asa.    The pt is on a statin for cholesterol management.  The pt is not on a daily aspirin .   Other AC:  Plavix  The pt is not on medication for hypertension.   The pt is not on medication for diabetes Tobacco hx:  never  Pt does not have family hx of AAA.  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    Past Surgical History:  Procedure Laterality Date   CATARACT EXTRACTION Right 2017   CHOLECYSTECTOMY N/A 12/07/2020   Procedure: LAPAROSCOPIC CHOLECYSTECTOMY WITH INTRAOPERATIVE CHOLANGIOGRAM;  Surgeon: Eletha Boas, MD;  Location: [facility];  Service: General;  Laterality: N/A;   ENDARTERECTOMY Right 12/27/2023   Procedure: RIGHT CAROTID ENDARTERECTOMY;  Surgeon: Serene Gaile ORN, MD;  Location: [facility] OR;  Service: Vascular;  Laterality: Right;   EYE SURGERY Bilateral    implants   LUMBAR DISC SURGERY     1995   PATCH ANGIOPLASTY Right 12/27/2023   Procedure: PATCH ANGIOPLASTY;  Surgeon: Serene Gaile ORN, MD;  Location: [facility] OR;  Service: Vascular;  Laterality: Right;   PATELLA FRACTURE SURGERY Left    TESTICLE REMOVAL Left      Allergies  Allergen Reactions   Antihistamines, Chlorpheniramine-Type Other (See Comments)    Antihistamines make me fidgety.    Current Outpatient Medications  Medication Sig Dispense Refill   atorvastatin  (LIPITOR) 40 MG tablet Take 40 mg by mouth daily.     clopidogrel  (PLAVIX ) 75 MG tablet Take 1 tablet (75 mg total) by mouth daily. 90 tablet 3   No current facility-administered medications for this visit.    Family History  Problem Relation Age of Onset   Cancer Mother        type unknown   Heart disease Mother    Stroke Brother     Social History   Socioeconomic History   Marital status: Married    Spouse name: Not on file   Number of children: 2   Years of education: Not on file   Highest education level: Not on file  Occupational History   Occupation: Curator  Tobacco Use   Smoking status: Never   Smokeless tobacco: Never  Vaping Use   Vaping status: Never Used  Substance and Sexual Activity   Alcohol use: No   Drug use: No   Sexual activity: Not on file  Other Topics Concern   Not on file  Social History Narrative   Not on file   Social Drivers of Health  Financial Resource Strain: Not on file  Food Insecurity: Low Risk  (01/09/2024)   Received from [facility]   Hunger Vital Sign    Within the past 12 months, you worried that your food would run out before you got money to buy more: Never true    Within the past 12 months, the food you bought just didn't last and you didn't have money to get more. : Never true  Transportation Needs: No Transportation Needs (01/09/2024)   Received from [facility]    In the past 12 months, has lack of reliable transportation kept you from medical appointments, meetings, work or from getting things needed for daily living? : No  Physical Activity: Not on file  Stress: Not on file  Social Connections: Socially Integrated (12/27/2023)   Social Connection and Isolation Panel    Frequency of  Communication with Friends and Family: More than three times a week    Frequency of Social Gatherings with Friends and Family: Three times a week    Attends Religious Services: More than 4 times per year    Active Member of Clubs or Organizations: Yes    Attends Banker Meetings: More than 4 times per year    Marital Status: Married  Catering manager Violence: Not At Risk (12/28/2023)   Humiliation, Afraid, Rape, and Kick questionnaire    Fear of Current or Ex-Partner: No    Emotionally Abused: No    Physically Abused: No    Sexually Abused: No     REVIEW OF SYSTEMS:   [X]  denotes positive finding, [ ]  denotes negative finding Cardiac  Comments:  Chest pain or chest pressure:    Shortness of breath upon exertion:    Short of breath when lying flat:    Irregular heart rhythm:        Vascular    Pain in calf, thigh, or hip brought on by ambulation:    Pain in feet at night that wakes you up from your sleep:     Blood clot in your veins:    Leg swelling:         Pulmonary    Oxygen at home:    Productive cough:     Wheezing:         Neurologic    Sudden weakness in arms or legs:     Sudden numbness in arms or legs:     Sudden onset of difficulty speaking or slurred speech:    Temporary loss of vision in one eye:     Problems with dizziness:         Gastrointestinal    Blood in stool:     Vomited blood:         Genitourinary    Burning when urinating:     Blood in urine:        Psychiatric    Major depression:         Hematologic    Bleeding problems:    Problems with blood clotting too easily:        Skin    Rashes or ulcers:        Constitutional    Fever or chills:      PHYSICAL EXAMINATION:  Today's Vitals   07/27/24 0957 07/27/24 0959  BP: (!) 141/72 (!) 141/69  Pulse: 61   Temp: 97.9 F (36.6 C)   TempSrc: Temporal   Weight: 167 lb 4.8 oz (75.9 kg)    Body  mass index is 24.71 kg/m.   General:  WDWN in NAD; vital signs  documented above Gait: Not observed HENT: WNL, normocephalic Pulmonary: normal non-labored breathing Cardiac: regular HR, without carotid bruits Abdomen: soft, NT; aortic pulse is not palpable Skin: without rashes Vascular Exam/Pulses:  Right Left  Radial 2+ (normal) 2+ (normal)   Extremities: without open wounds Musculoskeletal: no muscle wasting or atrophy  Neurologic: A&O X 3; moving all extremities equally; speech is fluent/normal Psychiatric:  The pt has Normal affect.   Non-Invasive Vascular Imaging:   Carotid Duplex on 07/27/2024 Right:  near normal Left:  60-79% ICA stenosis   Previous Carotid duplex on 08/27/2023: Right: >70% proximal ICA stenosis Left:   50-69% ICA stenosis    ASSESSMENT/PLAN:: 78 y.o. male here for follow up carotid artery stenosis and has hx of  right carotid endarterectomy for symptomatic carotid artery stenosis on 12/27/2023 by Dr. Serene.    -duplex today reveals right ICA is near normal and left remains in the 60-79% stenosis.  Discussed the varying categories of stenosis and that we would follow this every 6 months and if it gets to 80%, we would discuss intervention.  -discussed s/s of stroke with pt and he understands should he develop any of these sx, he will go to the nearest ER or call 911. -pt will f/u in 6 months with carotid duplex -pt will call sooner should he have any issues. -continue statin/plavix .  Discussed with him that cardiology prefers he be on plavix  and that he would need to discuss with them about changing to baby asa.   Lucie Apt, [facility] [phone]  Clinic MD:  Serene

## 2024-07-27 ENCOUNTER — Ambulatory Visit (HOSPITAL_COMMUNITY)
Admission: RE | Admit: 2024-07-27 | Discharge: 2024-07-27 | Disposition: A | Discharge status: Home / Self Care | Payer: Medicare Other | Source: Ambulatory Visit | Attending: Surgery | Admitting: Surgery

## 2024-07-27 ENCOUNTER — Ambulatory Visit: Payer: Medicare Other | Admitting: Physician Assistant

## 2024-07-27 VITALS — BP 141/69 | HR 61 | Temp 97.9°F | Wt 167.3 lb

## 2024-07-27 DIAGNOSIS — I6523 Occlusion and stenosis of bilateral carotid arteries: Secondary | ICD-10-CM

## 2024-07-28 ENCOUNTER — Other Ambulatory Visit: Payer: Self-pay

## 2024-07-28 DIAGNOSIS — I6523 Occlusion and stenosis of bilateral carotid arteries: Secondary | ICD-10-CM

## 2024-08-21 ENCOUNTER — Ambulatory Visit: Attending: Cardiology

## 2024-08-21 ENCOUNTER — Telehealth: Payer: Self-pay | Admitting: Cardiology

## 2024-08-21 DIAGNOSIS — G459 Transient cerebral ischemic attack, unspecified: Secondary | ICD-10-CM

## 2024-08-21 DIAGNOSIS — R002 Palpitations: Secondary | ICD-10-CM

## 2024-08-21 NOTE — Telephone Encounter (Signed)
 Spoke with the patient and her husband who report that last night the patient had an episode of his heart racing that lasted for several hours. He did not take his heart rate during this time. He denies any other associated symptoms during this episode. He states that it was worse when he was lying down and he felt relief when sitting up. He states that this morning he feels good with no concerns. He reports that he has had these episodes in the past but they are very seldom. He states that they started happening after he had surgery earlier this year. Advised patient that I will make Dr. Elmira aware and that he should continue to monitor for these episodes and to record his heart rates when they occur.

## 2024-08-21 NOTE — Progress Notes (Unsigned)
 Enrolled patient for a 14 day Zio XT  monitor to be mailed to patients home

## 2024-08-21 NOTE — Telephone Encounter (Signed)
 Recommend 2 week Zio monitor to evaluate for any arrhythmia. Diagnosis: Palpitations  Thanks MJP

## 2024-08-21 NOTE — Telephone Encounter (Signed)
 STAT if HR is under 50 or over 120 (normal HR is 60-100 beats per minute)  What is your heart rate? Cannot remember - Heart was racing  Do you have a log of your heart rate readings (document readings)? No  Do you have any other symptoms? No- not able to sleep

## 2024-08-24 NOTE — Telephone Encounter (Signed)
 Patient aware per Connell, RN

## 2024-09-09 DIAGNOSIS — R002 Palpitations: Secondary | ICD-10-CM | POA: Diagnosis not present

## 2024-09-09 DIAGNOSIS — G459 Transient cerebral ischemic attack, unspecified: Secondary | ICD-10-CM | POA: Diagnosis not present

## 2024-09-11 ENCOUNTER — Ambulatory Visit: Payer: Self-pay | Admitting: Cardiology

## 2024-09-11 NOTE — Progress Notes (Signed)
 Brief episodes of rapid heart beat. If recurrent symtoms of palpitations, could consider adding metoprolol  tartrate 25 mg bid. Otherwise, continue to monitor.  Thanks MJP

## 2024-09-14 MED ORDER — METOPROLOL TARTRATE 25 MG PO TABS: 25.0000 mg | ORAL_TABLET | Freq: Two times a day (BID) | ORAL | 1 refills | Status: AC

## 2024-09-14 NOTE — Telephone Encounter (Signed)
 Pt returning call to a nurse

## 2024-09-18 ENCOUNTER — Other Ambulatory Visit: Payer: Self-pay | Admitting: Cardiology

## 2025-01-21 NOTE — Progress Notes (Unsigned)
 " Office Note     CC:  follow up Requesting Provider:  Debrah Josette ORN., PA-C  HPI: Arthur Guzman is a 79 y.o. ([birth year]) male who presents for surveillance follow up of carotid artery stenosis. Pt is s/p right carotid endarterectomy for symptomatic carotid artery stenosis on 12/27/2023 by Dr. Serene.  He has done very well post operatively. We continue to follow his asymptomatic left ICA stenosis of 60-79%.  Today he denies any *** He is medically managed on Plavix  and statin. He was instructed by Cardiologist to take Plavix  lifelong.   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    Past Surgical History:  Procedure Laterality Date   CATARACT EXTRACTION Right 2017   CHOLECYSTECTOMY N/A 12/07/2020   Procedure: LAPAROSCOPIC CHOLECYSTECTOMY WITH INTRAOPERATIVE CHOLANGIOGRAM;  Surgeon: Eletha Boas, MD;  Location: [facility];  Service: General;  Laterality: N/A;   ENDARTERECTOMY Right 12/27/2023   Procedure: RIGHT CAROTID ENDARTERECTOMY;  Surgeon: Serene Gaile ORN, MD;  Location: [facility];  Service: Vascular;  Laterality: Right;   EYE SURGERY Bilateral    implants   LUMBAR DISC SURGERY     1995   PATCH ANGIOPLASTY Right 12/27/2023   Procedure: PATCH ANGIOPLASTY;  Surgeon: Serene Gaile ORN, MD;  Location: [facility];  Service: Vascular;  Laterality: Right;   PATELLA FRACTURE SURGERY Left    TESTICLE REMOVAL Left     Social History   Socioeconomic History   Marital status: Married    Spouse name: Not on file   Number of children: 2   Years of education: Not on file   Highest education level: Not on file  Occupational History   Occupation: curator  Tobacco Use   Smoking status: Never   Smokeless tobacco: Never  Vaping Use   Vaping status: Never Used  Substance and Sexual Activity   Alcohol use: No   Drug use: No   Sexual activity: Not on file  Other  Topics Concern   Not on file  Social History Narrative   Not on file   Social Drivers of Health   Tobacco Use: Low Risk (09/16/2024)   Received from [facility]   Patient History    Smoking Tobacco Use: Never    Smokeless Tobacco Use: Never    Passive Exposure: Not on file  Financial Resource Strain: Not on file  Food Insecurity: Low Risk (01/09/2024)   Received from [facility]   Epic    Within the past 12 months, you worried that your food would run out before you got money to buy more: Never true    Within the past 12 months, the food you bought just didn't last and you didn't have money to get more. : Never true  Transportation Needs: No Transportation Needs (01/09/2024)   Received from [facility]   Transportation    In the past 12 months, has lack of reliable transportation kept you from medical appointments, meetings, work or from getting things needed for daily living? : No  Physical Activity: Not on file  Stress: Not on file  Social Connections: Socially Integrated (12/27/2023)   Social Connection and Isolation Panel    Frequency of Communication with Friends and Family: More than three times a week    Frequency of Social Gatherings with Friends and Family: Three times a week  Attends Religious Services: More than 4 times per year    Active Member of Clubs or Organizations: Yes    Attends Banker Meetings: More than 4 times per year    Marital Status: Married  Catering Manager Violence: Not At Risk (12/28/2023)   Humiliation, Afraid, Rape, and Kick questionnaire    Fear of Current or Ex-Partner: No    Emotionally Abused: No    Physically Abused: No    Sexually Abused: No  Depression (PHQ2-9): Not on file  Alcohol Screen: Not on file  Housing: Low Risk (01/09/2024)   Received from [facility]   Epic    What is your living situation today?: I have a steady place to live    Think about the place you live. Do you have problems with any of the  following? Choose all that apply:: None/None on this list  Utilities: Low Risk (01/09/2024)   Received from [facility]   Utilities    In the past 12 months has the electric, gas, oil, or water company threatened to shut off services in your home? : No  Health Literacy: Not on file    Family History  Problem Relation Age of Onset   Cancer Mother        type unknown   Heart disease Mother    Stroke Brother     Current Outpatient Medications  Medication Sig Dispense Refill   atorvastatin  (LIPITOR) 40 MG tablet Take 40 mg by mouth daily.     clopidogrel  (PLAVIX ) 75 MG tablet TAKE 1 TABLET BY MOUTH EVERY DAY 90 tablet 3   metoprolol  tartrate (LOPRESSOR ) 25 MG tablet Take 1 tablet (25 mg total) by mouth 2 (two) times daily. 180 tablet 1   No current facility-administered medications for this visit.    Allergies[1]   REVIEW OF SYSTEMS:  *** [X]  denotes positive finding, [ ]  denotes negative finding Cardiac  Comments:  Chest pain or chest pressure:    Shortness of breath upon exertion:    Short of breath when lying flat:    Irregular heart rhythm:        Vascular    Pain in calf, thigh, or hip brought on by ambulation:    Pain in feet at night that wakes you up from your sleep:     Blood clot in your veins:    Leg swelling:         Pulmonary    Oxygen at home:    Productive cough:     Wheezing:         Neurologic    Sudden weakness in arms or legs:     Sudden numbness in arms or legs:     Sudden onset of difficulty speaking or slurred speech:    Temporary loss of vision in one eye:     Problems with dizziness:         Gastrointestinal    Blood in stool:     Vomited blood:         Genitourinary    Burning when urinating:     Blood in urine:        Psychiatric    Major depression:         Hematologic    Bleeding problems:    Problems with blood clotting too easily:        Skin    Rashes or ulcers:        Constitutional    Fever or chills:  PHYSICAL EXAMINATION:  There were no vitals filed for this visit.  General:  WDWN in NAD; vital signs documented above Gait: Not observed HENT: WNL, normocephalic Pulmonary: normal non-labored breathing Cardiac: {Desc; regular/irreg:14544} HR, without  Murmurs {With/Without:20273} carotid bruit*** Abdomen: soft Vascular Exam/Pulses: *** Extremities: {With/Without:20273} ischemic changes, {With/Without:20273} Gangrene , {With/Without:20273} cellulitis; {With/Without:20273} open wounds;  Musculoskeletal: no muscle wasting or atrophy  Neurologic: A&O X 3 Psychiatric:  The pt has Normal affect.   Non-Invasive Vascular Imaging:   VAS Carotid Duplex:    ASSESSMENT/PLAN:: 79 y.o. male here for follow up for ***   -***   Teretha Damme, PA-C [facility] [phone]  Clinic MD:  Serene     [1]  Allergies Allergen Reactions   Antihistamines, Chlorpheniramine-Type Other (See Comments)    Antihistamines make me fidgety.   "

## 2025-01-25 ENCOUNTER — Ambulatory Visit (HOSPITAL_COMMUNITY)

## 2025-01-25 ENCOUNTER — Ambulatory Visit
